# Patient Record
Sex: Male | Born: 1945 | Race: White | Hispanic: No | Marital: Married | Smoking: Former smoker
Health system: Southern US, Community
[De-identification: ages and names within clinical notes are randomized; demographics above are authoritative.]

## PROBLEM LIST (undated history)

## (undated) DIAGNOSIS — Z923 Personal history of irradiation: Secondary | ICD-10-CM

## (undated) DIAGNOSIS — C801 Malignant (primary) neoplasm, unspecified: Secondary | ICD-10-CM

## (undated) DIAGNOSIS — E785 Hyperlipidemia, unspecified: Secondary | ICD-10-CM

## (undated) DIAGNOSIS — I10 Essential (primary) hypertension: Secondary | ICD-10-CM

## (undated) HISTORY — PX: KNEE SURGERY: SHX244

## (undated) HISTORY — PX: ELBOW SURGERY: SHX618

## (undated) HISTORY — PX: SHOULDER ARTHROSCOPY: SHX128

---

## 1998-11-17 ENCOUNTER — Emergency Department (HOSPITAL_COMMUNITY): Admission: EM | Admit: 1998-11-17 | Discharge: 1998-11-17 | Payer: Self-pay | Admitting: Emergency Medicine

## 1999-01-29 ENCOUNTER — Ambulatory Visit (HOSPITAL_COMMUNITY): Admission: RE | Admit: 1999-01-29 | Discharge: 1999-01-29 | Payer: Self-pay | Admitting: Internal Medicine

## 2014-08-09 ENCOUNTER — Other Ambulatory Visit: Payer: Self-pay | Admitting: *Deleted

## 2014-08-09 ENCOUNTER — Other Ambulatory Visit: Payer: Self-pay | Admitting: Internal Medicine

## 2014-08-09 ENCOUNTER — Ambulatory Visit
Admission: RE | Admit: 2014-08-09 | Discharge: 2014-08-09 | Disposition: A | Discharge status: Home / Self Care | Payer: Medicare Other | Source: Ambulatory Visit | Attending: Internal Medicine | Admitting: Internal Medicine

## 2014-08-09 DIAGNOSIS — R05 Cough: Secondary | ICD-10-CM

## 2014-08-09 DIAGNOSIS — R059 Cough, unspecified: Secondary | ICD-10-CM

## 2014-09-23 ENCOUNTER — Ambulatory Visit
Admission: RE | Admit: 2014-09-23 | Discharge: 2014-09-23 | Disposition: A | Discharge status: Home / Self Care | Payer: Medicare Other | Source: Ambulatory Visit | Attending: Internal Medicine | Admitting: Internal Medicine

## 2014-09-23 ENCOUNTER — Other Ambulatory Visit: Payer: Self-pay | Admitting: Internal Medicine

## 2014-09-23 DIAGNOSIS — R918 Other nonspecific abnormal finding of lung field: Secondary | ICD-10-CM

## 2016-03-30 ENCOUNTER — Other Ambulatory Visit: Payer: Self-pay | Admitting: Internal Medicine

## 2016-03-30 ENCOUNTER — Ambulatory Visit
Admission: RE | Admit: 2016-03-30 | Discharge: 2016-03-30 | Disposition: A | Discharge status: Home / Self Care | Payer: Medicare Other | Source: Ambulatory Visit | Attending: Internal Medicine | Admitting: Internal Medicine

## 2016-03-30 DIAGNOSIS — M542 Cervicalgia: Secondary | ICD-10-CM

## 2016-08-25 ENCOUNTER — Other Ambulatory Visit: Payer: Self-pay | Admitting: Otolaryngology

## 2016-08-25 DIAGNOSIS — D3702 Neoplasm of uncertain behavior of tongue: Secondary | ICD-10-CM

## 2016-08-30 ENCOUNTER — Other Ambulatory Visit (HOSPITAL_COMMUNITY): Payer: Self-pay | Admitting: Otolaryngology

## 2016-08-30 DIAGNOSIS — D3702 Neoplasm of uncertain behavior of tongue: Secondary | ICD-10-CM

## 2016-09-01 ENCOUNTER — Ambulatory Visit
Admission: RE | Admit: 2016-09-01 | Discharge: 2016-09-01 | Disposition: A | Discharge status: Home / Self Care | Payer: Medicare Other | Source: Ambulatory Visit | Attending: Otolaryngology | Admitting: Otolaryngology

## 2016-09-01 DIAGNOSIS — D3702 Neoplasm of uncertain behavior of tongue: Secondary | ICD-10-CM

## 2016-09-01 MED ORDER — IOPAMIDOL (ISOVUE-300) INJECTION 61%
75.0000 mL | Freq: Once | INTRAVENOUS | Status: AC | PRN
  Administered 2016-09-01: 75 mL via INTRAVENOUS

## 2016-09-02 ENCOUNTER — Ambulatory Visit (INDEPENDENT_AMBULATORY_CARE_PROVIDER_SITE_OTHER): Payer: Medicare Other | Admitting: Family Medicine

## 2016-09-02 ENCOUNTER — Encounter: Payer: Self-pay | Admitting: Family Medicine

## 2016-09-02 VITALS — BP 124/72 | HR 84 | Resp 22

## 2016-09-02 DIAGNOSIS — T63441A Toxic effect of venom of bees, accidental (unintentional), initial encounter: Secondary | ICD-10-CM | POA: Diagnosis not present

## 2016-09-02 MED ORDER — EPINEPHRINE 0.3 MG/0.3ML IJ SOAJ
0.3000 mg | Freq: Once | INTRAMUSCULAR | Status: AC
  Administered 2016-09-02: 0.3 mg via INTRAMUSCULAR

## 2016-09-02 MED ORDER — PREDNISONE 20 MG PO TABS: ORAL_TABLET | ORAL | 0 refills | Status: DC

## 2016-09-02 MED ORDER — DIPHENHYDRAMINE HCL 50 MG/ML IJ SOLN
50.0000 mg | Freq: Once | INTRAMUSCULAR | Status: AC
  Administered 2016-09-02: 50 mg via INTRAMUSCULAR

## 2016-09-02 MED ORDER — METHYLPREDNISOLONE ACETATE 80 MG/ML IJ SUSP
80.0000 mg | Freq: Once | INTRAMUSCULAR | Status: AC
  Administered 2016-09-02: 80 mg via INTRAMUSCULAR

## 2016-09-02 NOTE — Progress Notes (Signed)
Subjective:    Patient ID: Joel Walton, male    DOB: 12/31/1945, 71 y.o.   MRN: [id number]  HPI Patient is a 71 year old white male previously unknown to me who walked into our clinic today in emergency. 10 minutes prior to arrival, he had been stung on his left cheek by a bee.  Almost instantly, his left eye had swollen shut, he developed severe swelling in his left cheek, his left upper lip began to swell. He also developed swelling and dryness in his throat. He states that he felt as though his throat was closing in. He stopped here immediately for assistance. Immediately he was given an EpiPen injection. The swelling in his throat subsided almost immediately. He was also given Depo-Medrol as well as 50 mg of Benadryl. At no point did he develop anaphylactic shock. His blood pressure remained stable for more than an hour. He did not develop any cardiac arrhythmia. He remained in normal sinus rhythm the entire time. There was no tachycardia or PVCs after the administration of EpiPen.  Patient was monitored in the office for more than 90 minutes after epipen and swelling improved, the swelling in his throat completely subsided. There was no wheezing. In his blood pressure remained stable. Past Medical History:  Diagnosis Date  . 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 No pertinent surgical history.  Current Outpatient Prescriptions:  .  HYDROcodone-acetaminophen (NORCO/VICODIN) 5-325 MG tablet, , Disp: , Rfl: 0 .  lisinopril-hydrochlorothiazide (PRINZIDE,ZESTORETIC) 10-12.5 MG tablet, , Disp: , Rfl: 4 .  lovastatin (MEVACOR) 20 MG tablet, , Disp: , Rfl: 3 .  omeprazole (PRILOSEC) 20 MG capsule, , Disp: , Rfl: 0 .  predniSONE (DELTASONE) 20 MG tablet, 3 tabs poqday 1-2, 2 tabs poqday 3-4, 1 tab poqday 5-6, Disp: 12 tablet, Rfl: 0 No Known Allergies Social History   Social History  . Marital status: Married    Spouse name: N/A  . Number of children: N/A  . Years of education: N/A     Occupational History  . Not on file.   Social History Main Topics  . Smoking status: Never Smoker  . Smokeless tobacco: Never Used  . Alcohol use No  . Drug use: No  . Sexual activity: Not Currently   Other Topics Concern  . Not on file   Social History Narrative  . No narrative on file      Review of Systems  All other systems reviewed and are negative.      Objective:   Physical Exam  Constitutional: He appears well-developed and well-nourished. He appears distressed.  HENT:  Head:    Right Ear: There is swelling.  Nose: Nose normal.  Mouth/Throat: Oropharynx is clear and moist and mucous membranes are normal. No oropharyngeal exudate.    Neck: Trachea normal and normal range of motion. Neck supple. No tracheal deviation present.  Cardiovascular: Normal rate, regular rhythm, S1 normal and S2 normal.  Exam reveals no gallop.   No murmur heard. Pulses:      Carotid pulses are 2+ on the right side, and 2+ on the left side. Pulmonary/Chest: Effort normal and breath sounds normal. No accessory muscle usage or stridor. No tachypnea. No respiratory distress. He has no decreased breath sounds. He has no wheezes. He has no rhonchi. He has no rales.  Vitals reviewed.         Assessment & Plan:  Allergic reaction to bee sting - Plan: EPINEPHrine (EPI-PEN) injection 0.3 mg, diphenhydrAMINE (  BENADRYL) injection 50 mg, methylPREDNISolone acetate (DEPO-MEDROL) injection 80 mg  Due to the possibility of an anaphylactic reaction, the patient was given an EpiPen in the left thigh at 0911.  He was also given 50 mg of Benadryl and 80 mg of Depo-Medrol at 0915.  Patient was monitored in the office for more than 90 minutes. The swelling in his upper lip and around his left eye improved dramatically during this time. At no time did he develop stridor or wheezing or shortness of breath or respiratory distress. His blood pressure remained stable throughout the entire time. At no  point did he develop any cardiac arrhythmia or tachycardia. The swelling in his throat subsided immediately after the epinephrine and did not return for more than 90 minutes. At that point, patient was asking to be able to go home to be with his wife. I recommended he continue Benadryl 25 mg every 4-6 hours until the swelling improves around his eye. Also gave the patient a prednisone taper pack to begin tomorrow to facilitate resolution of his allergic reaction. I recommended he follow-up with his primary care physician tomorrow for recheck and return immediately should any of his symptoms return or worsen.

## 2016-09-02 NOTE — Progress Notes (Addendum)
Patient walked into office approximately 15 minutes after being stung by bee 2. 1 sting on L upper cheek below eye and 1 sting on L middle outer calf. Patient noted to have redness and selling under L eye, and voice noted "gurgly". States that he feels like his throat is closing down.   Patient put in room emergently. Epi-Pen administered at 9:11am. MD evaluated and NO given for Benadryl 50mg  and DepoMedrol 80mg . Administered at 9:15am.   VS as follows: BP 128/70 HR 96 O2 96%  Patient kept in office to evaluate. Noted marked improvement in voice and anxiety reduced. Noted increased edema to upper lips and L lower cheek.  Per MD, continue to monitor.  VS upon re-check at 76min: BP 124/72 HR 84 O2 98

## 2016-09-03 ENCOUNTER — Encounter: Payer: Self-pay | Admitting: Radiation Oncology

## 2016-09-07 ENCOUNTER — Encounter (HOSPITAL_COMMUNITY)
Admission: RE | Admit: 2016-09-07 | Discharge: 2016-09-07 | Disposition: A | Discharge status: Home / Self Care | Payer: Medicare Other | Source: Ambulatory Visit | Attending: Otolaryngology | Admitting: Otolaryngology

## 2016-09-07 DIAGNOSIS — D3702 Neoplasm of uncertain behavior of tongue: Secondary | ICD-10-CM | POA: Insufficient documentation

## 2016-09-07 LAB — GLUCOSE, CAPILLARY: GLUCOSE-CAPILLARY: 104 mg/dL — AB (ref 65–99)

## 2016-09-07 MED ORDER — FLUDEOXYGLUCOSE F - 18 (FDG) INJECTION
9.0000 | Freq: Once | INTRAVENOUS | Status: AC | PRN
  Administered 2016-09-07: 9 via INTRAVENOUS

## 2016-09-08 ENCOUNTER — Encounter: Payer: Self-pay | Admitting: Hematology and Oncology

## 2016-09-08 ENCOUNTER — Telehealth: Payer: Self-pay | Admitting: *Deleted

## 2016-09-08 NOTE — Telephone Encounter (Signed)
Oncology Nurse Navigator Documentation  Placed new referral introductory call to include update for medical oncology appt.  No answer or message capability on patient's  mobile or home phone, work number not current, no answer or message capability on wife's phone.  Gayleen Orem, RN, BSN, [place] [facility] Nurse Portal at [place] [phone]

## 2016-09-08 NOTE — Progress Notes (Signed)
Location of Tumor:  Base ofTongue  Patient presented  months ago with symptoms of: July 2018, sore throat.  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   Nutrition Status Yes No Comments  Weight changes? [x]  []  8 lbs over 1 month  Swallowing concerns? [x]  []    PEG? []  [x]     Referrals Yes No Comments  Social Work? [x]  []    Dentistry? [x]  []  Dr. Enrique Sack 09/14/16 10:00  Swallowing therapy? [x]  []    Nutrition? [x]  []  Pt already started Boost/Ensure supplement  Med/Onc? [x]  []  Dr. Lebron Conners 09/14/2016   Safety Issues Yes No Comments  Prior radiation? []  [x]    Pacemaker/ICD? []  [x]    Possible current pregnancy? []  [x]    Is the patient on methotrexate? []  [x]     Tobacco/Marijuana/Snuff/ETOH use: Smoked, quit 1978, alcohol 3-4 beers per occasionally per day.  Past/Anticipated interventions by otolaryngology, if any:  Direct laryngoscopy and biopsy Dr. Lucia Gaskins  Past/Anticipated interventions by medical oncology, if any:  Appointment 09/14/2016 Dr. Lebron Conners   Current Complaints / other details:   09/07/2016    NM PET Image Initial (PI) Skull Base To Thigh IMPRESSION: 1. Large hypermetabolic mass centered at the RIGHT base of tongue extending to the LEFT base of tongue. 2. Hypermetabolic RIGHT level II metastatic node. 3. Two  smaller hypermetabolic LEFT level II metastatic nodes. 4. No evidence of distant metastatic disease in the chest, abdomen, or pelvis.  09/01/2016   CT SOFT TISSUE NECK W CONTRAST IMPRESSION: Invasive tumor at the right tongue base skullbase with necrosis and ulceration. Approximate measurements are 3.9 x 2.9 x 3.2 cm. The lesion extends down to the vallecula. There is likely involvement of the pre epiglottic fat. Tumor does cross the midline to the left.  Definite metastatic lymphadenopathy right level 2. Possible metastatic adenopathy left level 2.  Patient is  married and has a dog and two children and grandchildren. Patient's wife recently had knee replacement and she has MS so he cares for her and their grandchild.    Patient complaining of sore throat that radiates up into his right ear/head and now is migrating into the left ear rating a 10/10.  Patient states he is taking Norco for the pain relief. He states that he has had to alter his food from hamburgers to mashed potatoes and soup and he is supplementing with 1 -2 boost daily.  Patient states he drinks water but doesn't push fluids as much but encouraged patient to really push fluids as much as he can to prevent dehydration.    Vitals:   09/14/16 0837  BP: 133/88  Pulse: 78  Resp: 20  Temp: 98.4 F (36.9 C)  TempSrc: Oral  SpO2: 99%  Weight: 177 lb 9.6 oz (80.6 kg)   Wt Readings from Last 3 Encounters:  09/14/16 177 lb 9.6 oz (80.6 kg)

## 2016-09-09 ENCOUNTER — Telehealth: Payer: Self-pay | Admitting: *Deleted

## 2016-09-09 NOTE — Telephone Encounter (Signed)
Oncology Nurse Navigator Documentation  Placed introductory call to new referral patient Mr. Cosma.  Introduced myself as the H&N oncology nurse navigator that works with Drs. Ginny Forth to whom he has been referred by Dr. Lucia Gaskins.  He confirmed his understanding of referrals and appt date/times of 09/14/16 8:30 Nurse Eval, 9:00 Dr. Isidore Moos, 1:00 Dr. Lebron Conners.  I informed him of 10:00 appt with Dental Medicine following Dr. Isidore Moos.  I briefly explained my role as his navigator, indicated that I would be joining him during his appts next Tuesday.  I confirmed his understanding of [facility] location, explained arrival and RadOnc registration process.  I answered his questions re treatment plan, prognosis.  I provided my contact information, encouraged him to call with questions/concerns before next week.  He verbalized understanding of information provided, expressed appreciation for my call.  Gayleen Orem, RN, BSN, [place] [facility] Nurse [place] at [place] [phone]

## 2016-09-14 ENCOUNTER — Other Ambulatory Visit: Payer: Self-pay | Admitting: *Deleted

## 2016-09-14 ENCOUNTER — Ambulatory Visit
Admission: RE | Admit: 2016-09-14 | Discharge: 2016-09-14 | Disposition: A | Discharge status: Home / Self Care | Payer: Medicare Other | Source: Ambulatory Visit | Attending: Radiation Oncology | Admitting: Radiation Oncology

## 2016-09-14 ENCOUNTER — Encounter: Payer: Self-pay | Admitting: *Deleted

## 2016-09-14 ENCOUNTER — Ambulatory Visit
Admission: RE | Admit: 2016-09-14 | Discharge: 2016-09-14 | Disposition: A | Discharge status: Home / Self Care | Payer: Medicare Other | Source: Ambulatory Visit | Attending: Hematology and Oncology | Admitting: Hematology and Oncology

## 2016-09-14 ENCOUNTER — Encounter: Payer: Self-pay | Admitting: Radiation Oncology

## 2016-09-14 ENCOUNTER — Encounter: Payer: Self-pay | Admitting: Hematology and Oncology

## 2016-09-14 ENCOUNTER — Ambulatory Visit (HOSPITAL_COMMUNITY): Payer: Self-pay | Admitting: Dentistry

## 2016-09-14 ENCOUNTER — Encounter (HOSPITAL_COMMUNITY): Payer: Self-pay | Admitting: Dentistry

## 2016-09-14 ENCOUNTER — Ambulatory Visit (HOSPITAL_BASED_OUTPATIENT_CLINIC_OR_DEPARTMENT_OTHER): Payer: Medicare Other | Admitting: Hematology and Oncology

## 2016-09-14 VITALS — BP 110/64 | HR 49 | Temp 98.2°F

## 2016-09-14 VITALS — BP 96/63 | HR 96 | Temp 98.3°F | Resp 18 | Ht 69.0 in | Wt 178.7 lb

## 2016-09-14 VITALS — BP 133/88 | HR 78 | Temp 98.4°F | Resp 20 | Wt 177.6 lb

## 2016-09-14 DIAGNOSIS — Z79899 Other long term (current) drug therapy: Secondary | ICD-10-CM | POA: Diagnosis not present

## 2016-09-14 DIAGNOSIS — Z01818 Encounter for other preprocedural examination: Secondary | ICD-10-CM

## 2016-09-14 DIAGNOSIS — C01 Malignant neoplasm of base of tongue: Secondary | ICD-10-CM | POA: Diagnosis not present

## 2016-09-14 DIAGNOSIS — Z510 Encounter for antineoplastic radiation therapy: Secondary | ICD-10-CM | POA: Diagnosis present

## 2016-09-14 DIAGNOSIS — Z972 Presence of dental prosthetic device (complete) (partial): Secondary | ICD-10-CM

## 2016-09-14 DIAGNOSIS — K08109 Complete loss of teeth, unspecified cause, unspecified class: Secondary | ICD-10-CM

## 2016-09-14 DIAGNOSIS — I10 Essential (primary) hypertension: Secondary | ICD-10-CM | POA: Insufficient documentation

## 2016-09-14 DIAGNOSIS — K0820 Unspecified atrophy of edentulous alveolar ridge: Secondary | ICD-10-CM

## 2016-09-14 DIAGNOSIS — E785 Hyperlipidemia, unspecified: Secondary | ICD-10-CM | POA: Insufficient documentation

## 2016-09-14 DIAGNOSIS — R1310 Dysphagia, unspecified: Secondary | ICD-10-CM | POA: Insufficient documentation

## 2016-09-14 DIAGNOSIS — Z87891 Personal history of nicotine dependence: Secondary | ICD-10-CM | POA: Diagnosis not present

## 2016-09-14 DIAGNOSIS — C770 Secondary and unspecified malignant neoplasm of lymph nodes of head, face and neck: Secondary | ICD-10-CM

## 2016-09-14 DIAGNOSIS — C029 Malignant neoplasm of tongue, unspecified: Secondary | ICD-10-CM | POA: Insufficient documentation

## 2016-09-14 HISTORY — DX: Hyperlipidemia, unspecified: E78.5

## 2016-09-14 HISTORY — DX: Essential (primary) hypertension: I10

## 2016-09-14 LAB — BUN AND CREATININE (CC13)
BUN: 35.9 mg/dL — ABNORMAL HIGH (ref 7.0–26.0)
CREATININE: 1.1 mg/dL (ref 0.7–1.3)
EGFR: 72 mL/min/{1.73_m2} — AB (ref >90)

## 2016-09-14 NOTE — Patient Instructions (Signed)
Thank you for choosing [facility] to provide your oncology and hematology care.  To afford each patient quality time with our providers, please arrive 30 minutes before your scheduled appointment time.  If you arrive late for your appointment, you may be asked to reschedule.  We strive to give you quality time with our providers, and arriving late affects you and other patients whose appointments are after yours.   If you are a no show for multiple scheduled visits, you may be dismissed from the clinic at the providers discretion.    Again, thank you for choosing [facility], our hope is that these requests will decrease the amount of time that you wait before being seen by our physicians.  ______________________________________________________________________  Should you have questions after your visit to the [facility], please contact our office at [phone] between the hours of 8:30 and 4:30 p.m.    Voicemails left after 4:30p.m will not be returned until the following business day.    For prescription refill requests, please have your pharmacy contact us directly.  Please also try to allow 48 hours for prescription requests.    Please contact the scheduling department for questions regarding scheduling.  For scheduling of procedures such as PET scans, CT scans, MRI, Ultrasound, etc please contact central scheduling at [phone].    Resources For Cancer Patients and Caregivers:   Oncolink.org:  A wonderful resource for patients and healthcare providers for information regarding your disease, ways to tract your treatment, what to expect, etc.     American Cancer Society:  800-227-2345  Can help patients locate various types of support and financial assistance  Cancer Care: 1-800-813-HOPE (4673) Provides financial assistance, online support groups, medication/co-pay assistance.    [place] DSS:  336-641-3447 Where to apply for food  stamps, Medicaid, and utility assistance  Medicare Rights Center: 800-333-4114 Helps people with Medicare understand their rights and benefits, navigate the Medicare system, and secure the quality healthcare they deserve  [employer]: 336-333-6589 [employer]'s shared-ride transportation service for eligible riders who have a disability that prevents them from riding the fixed route bus.    For additional information on assistance programs please contact our social worker:   Grier Hock/Abigail Elmore:  [phone]            

## 2016-09-14 NOTE — Progress Notes (Signed)
error 

## 2016-09-14 NOTE — Progress Notes (Signed)
Radiation Oncology         [phone] ________________________________  Initial outpatient Consultation  Name: ZACHERIE HONEYMAN MRN: [id number]  Date: 09/14/2016  DOB: 04/19/1945  IW:[id number], Jori Moll, MD  Rozetta Nunnery, *   REFERRING PHYSICIAN: Rozetta Nunnery, *  DIAGNOSIS:    ICD-10-CM   1. 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   HISTORY OF PRESENT ILLNESS::Patterson L Palin is a 71 y.o. male who presented with symptoms of sore throat in July of 2018. Biopsy performed by Dr. 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 On 09/07/2016 patient underwent a  PET scan that revealed a large hypermetabolic mass centered at the right base of tongue extending to the left base of the tongue, hypermetabolic right level II metastatic node, two smaller hypermetabolic left level II metastatic nodes, and no evidence of distant metastatic disease in the chest, abdomen, or pelvis. Previous CT of the neck on 09/01/2016 revealed invasive tumor at the right tongue base with necrosis and ulceration measuring approximately 3.9 x 2.9 x 3.2 cm. ,the lesion extends down to the vallecula. There is likely involvement of the pre epiglottic fat and definite metastatic lymphadenopathy right level 2 and possible metastatic adenopathy left level 2.  Swallowing issues, if any: sore thoart   Pain status:Pain in his throat when he swallows.  Tobacco history, if any: He quit smoking in 1978.   ETOH abuse, if any: drinks 3-4 beer a day and does not use smokeless  tobacco.  Prior cancers, if any: None   Patient complains of throat pain, headache and has hearing loss.  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    PAST SURGICAL HISTORY: Past Surgical History:  Procedure Laterality Date  . ELBOW SURGERY Right   . KNEE SURGERY Bilateral    Not total knee replacements  . SHOULDER ARTHROSCOPY Right     FAMILY HISTORY: no report of similar cancers in family  SOCIAL HISTORY:  reports that he has quit smoking. He smoked 0.50 packs per day. He has never used smokeless tobacco. He reports that he drinks alcohol. He reports that he does not use drugs.  ALLERGIES: Patient has no known allergies.  MEDICATIONS:  Current Outpatient Prescriptions  Medication Sig Dispense Refill  . aspirin 81 MG chewable tablet Chew by mouth daily.    Marland Kitchen HYDROcodone-acetaminophen (NORCO/VICODIN) 5-325 MG tablet   0  . lisinopril-hydrochlorothiazide (PRINZIDE,ZESTORETIC) 10-12.5 MG tablet Take 1 tablet by mouth daily.   4  . lovastatin (MEVACOR) 20 MG tablet Take 40 mg by mouth daily.   3  . Multiple Vitamin (MULTIVITAMIN) tablet Take 1 tablet by mouth daily.    Marland Kitchen omeprazole (PRILOSEC) 20 MG capsule   0  . predniSONE (DELTASONE) 20 MG tablet 3 tabs poqday 1-2, 2 tabs poqday 3-4, 1 tab poqday 5-6 12 tablet 0   No current facility-administered medications for this encounter.     REVIEW OF SYSTEMS:  A 10+ POINT REVIEW OF SYSTEMS WAS OBTAINED  including neurology, dermatology, psychiatry, cardiac, respiratory, lymph, extremities, GI, GU, Musculoskeletal, constitutional,  HEENT.  All pertinent positives are noted in the HPI.  All others are negative.   PHYSICAL EXAM:  weight is 177 lb 9.6 oz (80.6 kg). His oral temperature is 98.4 F (36.9 C). His blood pressure is 133/88 and his pulse is 78. His respiration is 20 and oxygen saturation is 99%.   General: Alert and oriented,  in no acute distress HEENT: Head is normocephalic. Patient has dentures. Tongue is midline,  Extraocular movements are intact. Oropharynx appears clear. No palpable lesions on the back of tongue. Decreased hearing bilaterally.  Neck: Neck is notable for palpable mass in level 2 right neck about 3 cm, no other palpable masses appreciated.  Heart: Regular in rate and rhythm with no murmurs, rubs, or gallops. Chest: Clear to auscultation bilaterally, with no rhonchi, wheezes, or rales. Abdomen: Soft, nontender, nondistended, with no rigidity or guarding. Extremities: No cyanosis or edema. Strength is symmetric in his extremities.  Lymphatics: see Neck Exam Skin: No concerning lesions. Musculoskeletal: symmetric strength and muscle tone throughout.  Neurologic: Cranial nerves II through XII are grossly intact. No obvious focalities. Speech is fluent. Coordination is intact. Psychiatric: Judgment and insight are intact. Affect is appropriate.   ECOG = 0  0 - Asymptomatic (Fully active, able to carry on all predisease activities without restriction)  1 - Symptomatic but completely ambulatory (Restricted in physically strenuous activity but ambulatory and able to carry out work of a light or sedentary nature. For example, light housework, office work)  2 - Symptomatic, <50% in bed during the day (Ambulatory and capable of all self care but unable to carry out any work activities. Up and about more than 50% of waking hours)  3 - Symptomatic, >50% in bed, but not bedbound (Capable of only limited self-care, confined to bed or chair 50% or more of waking hours)  4 - Bedbound (Completely disabled. Cannot carry on any self-care. Totally confined to bed or chair)  5 - Death   Eustace Pen MM, Creech RH, Tormey DC, et al. [phone]). "Toxicity and response criteria of the [facility]". [place] Oncol. 5 (6): 649-55   LABORATORY DATA:  No results found for: WBC, HGB, HCT, MCV,  PLT CMP     Component Value Date/Time   BUN 35.9 (H) 09/14/2016 1431   CREATININE 1.1 09/14/2016 1431        RADIOGRAPHY: Ct Soft Tissue Neck W Contrast  Result Date: 09/01/2016 CLINICAL DATA:  Neoplasm of the tongue. EXAM: CT NECK WITH CONTRAST TECHNIQUE: Multidetector CT imaging of the neck was performed using the standard protocol following the bolus administration of intravenous contrast. CONTRAST:  42mL ISOVUE-300 IOPAMIDOL (ISOVUE-300) INJECTION 61% COMPARISON:  None. FINDINGS: Pharynx and larynx: There is a large tumor invasive at the right tongue base with ulceration. This has a maximal AP dimension of 3.9 cm, with a transverse dimension otherwise of approximately 2.9 x 3.2 cm. The tumor extends down to the vallecula. There is probable involvement of the pre epiglottic fat. The tumor does cross the midline to the left. Salivary glands: Parotid and submandibular glands are normal. Small area of focal fatty atrophy in the left parotid gland. Thyroid: Normal Lymph nodes: Metastatic node right level 2 with internal low density measuring 3.2 x 2.5 x 1.6 cm. No other abnormal node on the right. On the left, there are questionable abnormal level 2 nodes, the more superior measuring 1.7 cm in  length with short axis of 8 mm in the lower node measuring 2.9 cm in length with short axis of 8 mm. Vascular: Normal Limited intracranial: Normal Visualized orbits: Normal Mastoids and visualized paranasal sinuses: Clear/normal Skeleton: Advanced degenerative spondylosis. Upper chest: Negative Other: None IMPRESSION: Invasive tumor at the right tongue base skullbase with necrosis and ulceration. Approximate measurements are 3.9 x 2.9 x 3.2 cm. The lesion extends down to the vallecula. There is likely involvement of the pre epiglottic fat. Tumor does cross the midline to the left. Definite metastatic lymphadenopathy right level 2. Possible metastatic adenopathy left level 2. Electronically Signed   By: Nelson Chimes  M.D.   On: 09/01/2016 09:50   Nm Pet Image Initial (pi) Skull Base To Thigh  Result Date: 09/07/2016 CLINICAL DATA:  Initial treatment strategy for base of tongue neoplasm RIGHT ear pain. Sore throat. EXAM: NUCLEAR MEDICINE PET SKULL BASE TO THIGH TECHNIQUE: 9.0 mCi F-18 FDG was injected intravenously. Full-ring PET imaging was performed from the skull base to thigh after the radiotracer. CT data was obtained and used for attenuation correction and anatomic localization. FASTING BLOOD GLUCOSE:  Value: 104 mg/dl COMPARISON:  Neck CT 09/01/2016 FINDINGS: NECK Mass lesion centered at the RIGHT base of tongue and extending to the LEFT base of tongue with intense metabolic activity (SUV max equal 20.1). Metabolic activity extends inferior to the level the vallecula and pre epiglottic fat. Enlarged hypermetabolic RIGHT level 2 lymph node with intense metabolic activity (SUV max equal 19.5) measures 17 mm short axis (image 28, series 4). Intense metabolic activity for size associated with smaller LEFT level 2 lymph nodes measuring 6 mm short axis on image 20, series 4 and 7 mm short axis on image 30, series 4. These lymph nodes have SUV max of 3.2 and 3.4 respectively. CHEST No hypermetabolic mediastinal lymph nodes. No hypermetabolic pulmonary nodules. No suspicious pulmonary nodule on CT portion exam. ABDOMEN/PELVIS No abnormal hypermetabolic activity within the liver, pancreas, adrenal glands, or spleen. No hypermetabolic lymph nodes in the abdomen or pelvis. SKELETON No focal hypermetabolic activity to suggest skeletal metastasis. IMPRESSION: 1. Large hypermetabolic mass centered at the RIGHT base of tongue extending to the LEFT base of tongue. 2. Hypermetabolic RIGHT level II metastatic node. 3. Two  smaller hypermetabolic LEFT level II metastatic nodes. 4. No evidence of distant metastatic disease in the chest, abdomen, or pelvis. Electronically Signed   By: Suzy Bouchard M.D.   On: 09/07/2016 16:09       IMPRESSION/PLAN: Base of tongue cancer, p16+  I personally reviewed the patient's CT and PET imaging. This is a delightful patient with head and neck cancer. I recommend radiotherapy for this patient and also that the patient quits drinking alcohol completely (he reports he has stopped temporarily, so I advised him to quit permanently) as this may have played a role in his disease, in addition to the HPV.  We discussed the potential risks, benefits, and side effects of radiotherapy. We talked in detail about acute and late effects. We discussed that some of the most bothersome acute effects may be mucositis, dysgeusia, salivary changes, skin irritation, hair loss, dehydration, weight loss and fatigue. We talked about late effects which include but are not necessarily limited to dysphagia, hypothyroidism, nerve injury, spinal cord injury, xerostomia,  and neck edema. No guarantees of treatment were given. A consent form was signed and placed in the patient's medical record. The patient is enthusiastic about proceeding with treatment. I look forward to participating  in the patient's care.Treatment will take approximately 7 weeks. We recommend a feeding tube and the patient was educated on how to use the feeding tube during treatment.    I will prescribe lidocacine mouthwash to patient's pharmacy at [facility] on [address] and I will order labs as well.  Simulation (treatment planning) will take place on 09/15/2016 and radiation will start on September 28, 2016.    We also discussed that the treatment of head and neck cancer is a multidisciplinary process to maximize treatment outcomes and quality of life. For this reasons the following referrals have been or will be made:  Patient is scheduled for an appointment today with Dr. Lebron Conners in Medical oncology to discuss chemotherapy.  Patient is scheduled for an appointment today at 10:00am with Dr. Enrique Sack on in Dentistry for dental evaluation, in case of  retained tooth roots prior to radiotherapy.    Nutritionist for nutrition support during and after treatment.   Speech language pathology for swallowing and/or speech therapy.   Social work for social support.    Physical therapy due to risk of lymphedema in neck and deconditioning.   Baseline labs including TSH  __________________________________________   Joel Gibson, MD This document serves as a record of services personally performed by Joel Gibson, MD. It was created on her behalf by Valeta Harms, a trained medical scribe. The creation of this record is based on the scribe's personal observations and the provider's statements to them. This document has been checked and approved by the attending provider.

## 2016-09-14 NOTE — Progress Notes (Signed)
DENTAL CONSULTATION  Date of Consultation:  09/14/2016 Patient Name:   Detravion Tester Wawrzyniak Date of Birth:   1945-11-22 Medical Record Number: [id number]  VITALS: BP 110/64 (BP Location: Left Arm)   Pulse (!) 49   Temp 98.2 F (36.8 C) (Oral)   CHIEF COMPLAINT: Patient referred by Dr. Isidore Moos for a dental consultation.  HPI: DESIREE FLEMING is a 71 year old male recently diagnosed with squamous cell carcinoma of the base of tongue. Patient with anticipated chemoradiation therapy. Patient is now seen as part of a pre-chemoradiation therapy dental protocol examination.  The patient is edentulous. The patient has upper and lower complete dentures that were recently fabricated approximately one year ago. They were fabricated by Dr. Joselyn Glassman.  The patient indicates that the upper lower dentures "fit fine." Patient has been wearing upper and lower complete dentures since approximately 1978. The patient denies having dental phobia.   PROBLEM LIST: Patient Active Problem List   Diagnosis Date Noted  . Cancer of base of tongue ([place]) 09/14/2016    Priority: High    PMH: Past Medical History:  Diagnosis Date  . Hyperlipidemia   . Hypertension   . Lymph node cancer (HCC)     PSH: Past Surgical History:  Procedure Laterality Date  . ELBOW SURGERY Right   . KNEE SURGERY Bilateral   . SHOULDER ARTHROSCOPY Right     ALLERGIES: No Known Allergies  MEDICATIONS: Current Outpatient Prescriptions  Medication Sig Dispense Refill  . aspirin 81 MG chewable tablet Chew by mouth daily.    Marland Kitchen HYDROcodone-acetaminophen (NORCO/VICODIN) 5-325 MG tablet   0  . lisinopril-hydrochlorothiazide (PRINZIDE,ZESTORETIC) 10-12.5 MG tablet Take 1 tablet by mouth daily.   4  . lovastatin (MEVACOR) 20 MG tablet Take 40 mg by mouth daily.   3  . Multiple Vitamin (MULTIVITAMIN) tablet Take 1 tablet by mouth daily.    Marland Kitchen omeprazole (PRILOSEC) 20 MG capsule   0  . predniSONE (DELTASONE) 20 MG tablet 3 tabs  poqday 1-2, 2 tabs poqday 3-4, 1 tab poqday 5-6 (Patient not taking: Reported on 09/14/2016) 12 tablet 0   No current facility-administered medications for this visit.     LABS: No results found for: WBC, HGB, HCT, MCV, PLT No results found for: NA, K, CL, CO2, GLUCOSE, BUN, CREATININE, CALCIUM, GFRNONAA, GFRAA No results found for: INR, PROTIME No results found for: PTT  SOCIAL HISTORY: Social History   Social History  . Marital status: Married    Spouse name: N/A  . Number of children: 2  . Years of education: N/A   Occupational History  . Retired      Personal assistant   Social History Main Topics  . Smoking status: Former Research scientist (life sciences)  . Smokeless tobacco: Never Used     Comment: Quit in 1978  . Alcohol use Yes     Comment: 3-4 beers daily  . Drug use: No  . Sexual activity: Not Currently   Other Topics Concern  . Not on file   Social History Narrative  . No narrative on file   FAMILY HISTORY: History reviewed. No pertinent family history.  Mother and father are both deceased.  REVIEW OF SYSTEMS: Reviewed with the patient as per History of present illness. Psych: Patient denies having dental phobia.  DENTAL HISTORY: CHIEF COMPLAINT: Patient referred by Dr. Isidore Moos for a dental consultation.  HPI: RHETT MUTSCHLER is a 71 year old male recently diagnosed with squamous cell carcinoma of the base of tongue. Patient with anticipated  chemoradiation therapy. Patient is now seen as part of a pre-chemoradiation therapy dental protocol examination.  The patient is edentulous. The patient has upper and lower complete dentures that were recently fabricated approximately one year ago. They were fabricated by Dr. Joselyn Glassman.  The patient indicates that the upper lower dentures "fit fine." Patient has been wearing upper and lower complete dentures since approximately 1978. The patient denies having dental phobia.    DENTAL EXAMINATION: GENERAL:Patient is a  well-developed, well-nourished male in no acute distress. HEAD AND NECK:  Patient has palpable right neck lymphadenopathy. Patient has left neck involvement per PET scan  INTRAORAL EXAM:Patient has normal saliva. Patient has flabby tissues associated with the right and left maxillary tuberosities, premaxilla, and mandibular anterior soft tissues. There is atrophy of the edentulous alveolar ridges. PROSTHODONTIC: The patient has upper and lower complete dentures. The maxillary complete denture is stable and retentive. The mandibular complete denture has less than ideal retention secondary to significant atrophy of the edentulous alveolar ridges.  OCCLUSION:The occlusion of the upper and lower complete dentures is acceptable.  RADIOGRAPHIC INTERPRETATION: An orthopantogram was taken. Patient is edentulous. There is atrophy of the edentulous alveolar ridges.  There is no evidence of retained root tips or impacted teeth. There is pneumatization of the bilateral maxillary sinuses.  ASSESSMENTS: 1. Squamous cell carcinoma the base of tongue 2. Pre-chemotherapy ration therapy dental protocol 3. The patient is edentulous. 4. There is atrophy of the edentulous alveolar ridges. 5. The patient has flabby tissues associated with the right and left maxillary tuberosities, premaxilla, and mandibular anterior areas. 6. Upper and lower complete dentures that are clinically acceptable   PLAN/RECOMMENDATIONS: 1. I discussed the risks, benefits, and complications of various treatment options with the patient in relationship to his medical and dental conditions, anticipated chemoradiation therapy, chemoradiation therapy side effects to include xerostomia, trismus, mucositis, taste changes, gum and jawbone changes, and risk for osteoradionecrosis. We discussed various treatment options to include no treatment, pre-prosthetic surgery as indicated, implant therapy, and replacement of missing teeth as indicated. The  patient currently refuses all dental treatment at this time. The patient is currently cleared for chemoradiation therapy at this time.   2. Discussion of findings with medical team and coordination of future medical and dental care as needed.  I spent in excess of  60 minutes during the conduct of this consultation and >50% of this time involved direct face-to-face encounter for counseling and/or coordination of the patient's care.    Lenn Cal, DDS

## 2016-09-14 NOTE — Patient Instructions (Signed)

## 2016-09-15 ENCOUNTER — Ambulatory Visit
Admission: RE | Admit: 2016-09-15 | Discharge: 2016-09-15 | Disposition: A | Discharge status: Home / Self Care | Payer: Medicare Other | Source: Ambulatory Visit | Attending: Radiation Oncology | Admitting: Radiation Oncology

## 2016-09-15 ENCOUNTER — Telehealth: Payer: Self-pay | Admitting: *Deleted

## 2016-09-15 ENCOUNTER — Encounter: Payer: Self-pay | Admitting: *Deleted

## 2016-09-15 ENCOUNTER — Other Ambulatory Visit: Payer: Self-pay | Admitting: Radiation Oncology

## 2016-09-15 DIAGNOSIS — R634 Abnormal weight loss: Secondary | ICD-10-CM

## 2016-09-15 DIAGNOSIS — C01 Malignant neoplasm of base of tongue: Secondary | ICD-10-CM

## 2016-09-15 DIAGNOSIS — Z510 Encounter for antineoplastic radiation therapy: Secondary | ICD-10-CM | POA: Diagnosis not present

## 2016-09-15 MED ORDER — LIDOCAINE VISCOUS 2 % MT SOLN: OROMUCOSAL | 5 refills | Status: DC

## 2016-09-15 NOTE — Progress Notes (Signed)
error 

## 2016-09-15 NOTE — Telephone Encounter (Signed)
Oncology Nurse Navigator Documentation  Spoke with Mr. Joel Walton, informed him IV contrast has been cancelled for today's CT SIM, asked him to arrive at 1:45.  He voiced understanding.  Gayleen Orem, RN, BSN, [place] Neck Oncology Nurse [place] at [place] [phone]

## 2016-09-15 NOTE — Addendum Note (Signed)
Encounter addended by: Brexlee Heberlein, Stephani Police, RN on: 09/15/2016 10:12 AM<BR>    Actions taken: Sign clinical note

## 2016-09-15 NOTE — Progress Notes (Signed)
Head and Neck Cancer Simulation, IMRT treatment planning, and Special treatment procedure note   Outpatient  Diagnosis:    ICD-10-CM   1. Cancer of base of tongue (HCC) C01    The patient was taken to the CT simulator and laid in the supine position on the table. An Aquaplast head and shoulder mask was custom fitted to the patient's anatomy. High-resolution CT axial imaging was obtained of the head and neck with contrast. I verified that the quality of the imaging is good for treatment planning. 1 Medically Necessary Treatment Device was fabricated and supervised by me: Aquaplast mask.  Treatment planning note I plan to treat the patient with IMRT. I plan to treat the patient's tumor and bilateral neck nodes. I plan to treat to a total dose of 70 Gray in 35  fractions. Dose calculation was ordered from dosimetry.  IMRT planning Note  IMRT is medically necessary and an important modality to deliver adequate dose to the patient's at risk tissues while sparing the patient's normal structures, including the: esophagus, parotid tissue, mandible, brain stem, spinal cord, oral cavity, brachial plexus.  This justifies the use of IMRT in the patient's treatment.   Special Treatment Procedure Note:  The patient will be receiving chemotherapy concurrently. Chemotherapy heightens the risk of side effects. I have considered this during the patient's treatment planning process and will monitor the patient accordingly for side effects on a weekly basis. Concurrent chemotherapy increases the complexity of this patient's treatment and therefore this constitutes a special treatment procedure.  -----------------------------------  Rhonda Linan, MD 

## 2016-09-15 NOTE — Telephone Encounter (Signed)
Oncology Nurse Navigator Documentation  Spoke with Kara, [facility], requested PEG kit delivery by next Monday to [facility] front desk for my delivery to patient. She voiced understanding.  Rick Sederick Jacobsen, RN, BSN, CHPN Head & Neck Oncology Nurse Navigator [facility] at [place] [phone]    

## 2016-09-16 ENCOUNTER — Encounter: Payer: Self-pay | Admitting: *Deleted

## 2016-09-16 NOTE — Progress Notes (Signed)
Oncology Nurse Navigator Documentation  Met with Mr. Brobeck during initial consult with Dr. Lebron Conners. He voiced understanding of plan for chemotherapy in conjunction with RT. He voiced further understanding of purpose/benefit of PAC and PEG placement.  I showed example of PowerPort PAC, explained placement procedure. He voiced understanding I will provide PEG education prior to placement, will be support resource to assist with care. I encouraged him to call me with questions/concerns.  Gayleen Orem, RN, BSN, [place] Neck Oncology Nurse [place] at [place] [phone]

## 2016-09-16 NOTE — Progress Notes (Signed)
Oncology Nurse Navigator Documentation  Met with patient during initial consult with Dr. Isidore Moos.  He was accompanied by    1. Further introduced myself as his Navigator, explained my role as a member of the Care Team.   2. Provided New Patient Information packet, discussed contents:  Contact information for physician(s), myself, other members of the Care Team.  Advance Directive information ([place] blue pamphlet with LCSW contact info)  Fall Prevention Patient Enterprise sheet  [place] campus map with highlight of [place] 3. Provided introductory explanation of radiation treatment including SIM planning and purpose of Aquaplast head and shoulder mask, showed him example.   4. Provided and discussed education handouts for PEG and PAC.  5.   He voiced understanding radiation planning session is scheduled for tomorrow, 2:00. 6.   He reported:  Drinking boost, 1-2 Boost daily.  Advised to increase to address recent weight loss, increase water intake also.  Drinking 3-4 beers daily.  Advised to curtail all ETOH to optimize prognosis.  Primary caregiver for wife who has MS, had recent R knee replacement.    Daughter most likely will be able to provide transportation assist when needed. He voiced understanding of information provided. I escorted him to [place] for appt, he voiced understanding I will be joining him for his appt with Dr. Lebron Conners later this afternoon.  Gayleen Orem, RN, BSN, [place] Neck Oncology Nurse [place] at [place] [phone]

## 2016-09-16 NOTE — Progress Notes (Deleted)
Oncology Nurse Navigator Documentation  Met with patient during initial consult with Dr. Isidore Moos.  He was unaccompanied.    1. Further introduced myself as his Navigator, explained my role as a member of the Care Team.   2. Provided New Patient Information packet, discussed contents:  Contact information for physician(s), myself, other members of the Care Team.  Advance Directive information ([place] blue pamphlet with LCSW contact info)  Fall Prevention Patient Safety Plan  Appointment guideline.  Financial Assistance Information sheet  [place] with highlight of [place] 3. Provided introductory explanation of radiation treatment including SIM planning and purpose of Aquaplast head and shoulder mask, showed them example.   4. Provided and discussed education handouts for PEG and PAC.  5. He voiced understanding radiation planning session is scheduled for tomorrow, 2:00. 6. He reported:  Drinking boost, 1-2 Boost daily.  Advised to increase to address recent weight loss, increase water intake also.  Drinking 3-4 beers daily.  Advised to curtail all ETOH to optimize prognosis.  Primary caregiver for wife who has MS, had recent R knee replacement.    Daughter most likely will be able to provide transportation assist when needed. He voiced understanding of information provided. I escorted him to [place] for appt, he voiced understanding I will be joining him for his appt with Dr. Lebron Conners later this afternoon.  Gayleen Orem, RN, BSN, [place] [facility] Oncology Nurse [place] at [place] [phone]

## 2016-09-16 NOTE — Progress Notes (Signed)
Note entered incorrect day, deleted, reentered for 8/7.

## 2016-09-16 NOTE — Progress Notes (Signed)
Oncology Nurse Navigator Documentation  To provide support, encouragement and care continuity, met with Mr. Kawecki during CT SIM He tolerated procedure without difficulty. I showed him location of [place], explained arrival/preparation procedures.  He voiced understanding. Provided him barium sulfate solution obtained from [facility] for PEG placement scheduled for 8/17, reviewed instructions.  He voiced understanding.  We arrived to Radiation Nursing for PEG education. Using  PEG teaching device   and Teach Back, provided education for PEG use and care, including: hand hygiene, gravity bolus administration of daily water flushes and nutritional supplement, fluids and medications; care of tube insertion site including daily dressing change and cleaning; S&S of infection.  Mr. Luecke correctly verbalized dressing change and cleaning procedures, provided correct return demonstration of dressing change and gravity administration of water.  I provided written instructions for PEG flushing/dressing change in support of verbal instruction.  He understands I will be available for ongoing PEG support.  I described contents of PEG Starter Kit he will be receiving day of procedure.    I encouraged him to call me with questions/concerns.  He voiced understanding.  Gayleen Orem, RN, BSN, [place] [facility] Oncology Nurse [place] at [place] [phone]

## 2016-09-17 ENCOUNTER — Encounter: Payer: Self-pay | Admitting: Hematology and Oncology

## 2016-09-17 NOTE — Assessment & Plan Note (Signed)
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  No evidence of distal metastatic disease at this time based on PET/CT. Patient appears to be a good candidate for curative-intent therapy with combined chemotherapy-radiation modality. He is not a good candidate for cisplatin-based chemotherapy due to pre-existing hearing loss requiring hearing aids. In this situation, I prefer to use carboplatin--based therapy regimen.  Based on presence of ulcerated primary lesion, consideration can be made to neoadjuvant chemotherapy preceding the chemoradiotherapy approach which would potentially assist in healing the preexistent ulceration and reduce the impact of mucositis that will accompany combined treatment modality. Treatment may start with carboplatin/paclitaxel combination followed by carboplatin/5-FU concurrent with radiation therapy.  If delay in the definitive treatment is not desired, I have concerns that need to position 5-FU may result in excessive mucositis that may be very detrimental to the patient's quality of life and ability to tolerate the needed frequency of treatments. Nevertheless, if the patient and my colleagues desire to proceed with the treatment at this time, carboplatin and paclitaxel can be the systemic treatment combination to use.  Plan: --Consult IR for port & PEGT placement --RTC 2 weeks: labs, clinic visit and initiation of chemotherapy or chemoradiotherapy  Voice recognition software was used and creation of this note. Despite my best effort at editing the text, some misspelling/errors may have occurred.

## 2016-09-17 NOTE — Progress Notes (Signed)
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  No evidence of distal metastatic disease at this time based on PET/CT. Patient appears to be a good candidate for curative-intent therapy with combined chemotherapy-radiation modality. He is not a good candidate for cisplatin-based chemotherapy due to pre-existing hearing loss requiring hearing aids. In this situation, I prefer to use carboplatin--based therapy regimen.  Based on presence of ulcerated primary lesion, consideration can be made to neoadjuvant chemotherapy preceding the chemoradiotherapy approach which would potentially assist in healing the preexistent ulceration and reduce the impact of mucositis that will accompany combined treatment modality. Treatment may start with carboplatin/paclitaxel combination followed by carboplatin/5-FU concurrent with radiation therapy.  If delay in the definitive treatment is not desired, I have concerns that need to position 5-FU may result in excessive mucositis that may be very detrimental to the patient's quality of life and ability to tolerate the needed frequency of treatments. Nevertheless, if the patient and my colleagues desire to proceed with the treatment at this time, carboplatin and paclitaxel can be the systemic treatment combination to use.  Plan: --Consult IR for port & PEGT placement --RTC 2 weeks: labs, clinic visit and initiation of chemotherapy or chemoradiotherapy  Voice recognition software was used and creation of this note. Despite my best effort at editing the text, some misspelling/errors may have occurred.   Orders Placed This Encounter  Procedures  . IR FLUORO GUIDE PORT  INSERTION LEFT    Standing Status:   Future    Standing Expiration Date:   11/14/2017    Order Specific Question:   Reason for Exam (SYMPTOM  OR DIAGNOSIS REQUIRED)    Answer:   New head and neck cancer diagnosis, planned for CRT, pelase eval for port placement for chemotherapy administration    Order Specific Question:   Preferred Imaging Location?    Answer:   [facility]  . IR Gastrostomy Tube    Standing Status:   Future    Standing Expiration Date:   11/14/2017    Order Specific Question:   Reason for exam:    Answer:   New head and neck cancer diagnosis, planned for CRT, pelase eval for PEGT placement for nutritional support    Order Specific Question:   Preferred Imaging Location?    Answer:   [facility]  . CBC with Differential    Standing Status:   Future    Standing Expiration Date:   09/14/2017  . Comprehensive metabolic panel    Standing Status:   Future    Standing Expiration Date:   09/14/2017  . Magnesium    Standing Status:   Future    Standing Expiration Date:   09/14/2017  . Phosphorus    Standing Status:   Future    Standing Expiration Date:   09/14/2017    All questions were answered.  . The patient knows to call the clinic with any problems, questions or concerns.  This note was electronically signed.    History of Presenting Illness Joel Walton 71 y.o. 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 Please see oncological history for details.  Patient has initially presented to his primary care provider in July 2018 with sore throat. He was found to have a mass in the upper neck and underwent a biopsy by Dr. 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 Additional staging evaluation with PET/CT obtained on 09/07/16 demonstrated a large hypermetabolic mass in the right base of the tongue crossing the midline,  as well as hypermetabolic lymphadenopathy in bilateral level II lymph nodes. No evidence of distal metastatic disease identified. CT scan measured the mass at 3.9 cm by largest dimension with necrosis and ulceration. Patient denies any dysphagia, but does have some odynophagia and does complain of tingling and numbness over the right side of his face. He denies any chest pain, shortness of breath or cough. No nausea, vomiting, abdominal pain, diarrhea or constipation. No dysuria or hematuria. No neurological deficits. Patient does have pre-existing hearing loss and is wearing hearing aids. No history of any renal problems. Patient does not smoke and does not drink alcohol.  Medical History: Past Medical History:  Diagnosis Date  . Hyperlipidemia   . Hypertension     Surgical History: Past Surgical History:  Procedure Laterality Date  . ELBOW SURGERY Right   . KNEE SURGERY Bilateral    Not total knee replacements  . SHOULDER ARTHROSCOPY Right     Family History: History reviewed. No pertinent family history.  Social History: Social History   Social History  . Marital status: Married    Spouse name: N/A  . Number of children: 2  . Years of education: N/A   Occupational History  . Retired      Personal assistant   Social History Main Topics  . Smoking status: Former Smoker    Packs/day: 0.50  . Smokeless tobacco: Never Used     Comment: Quit in 1978  . Alcohol use Yes     Comment: 3-4 beers daily  . Drug use: No  . Sexual activity: Not Currently   Other Topics Concern  . Not on file   Social History Narrative  . No narrative on file    Allergies: No Known Allergies  Medications:  Current Outpatient Prescriptions  Medication Sig Dispense Refill  . aspirin 81 MG chewable tablet Chew by mouth daily.    Marland Kitchen HYDROcodone-acetaminophen (NORCO/VICODIN) 5-325 MG tablet   0  . lisinopril-hydrochlorothiazide (PRINZIDE,ZESTORETIC) 10-12.5 MG tablet Take 1 tablet by mouth  daily.   4  . lovastatin (MEVACOR) 20 MG tablet Take 40 mg by mouth daily.   3  . Multiple Vitamin (MULTIVITAMIN) tablet Take 1 tablet by mouth daily.    Marland Kitchen omeprazole (PRILOSEC) 20 MG capsule   0  . predniSONE (DELTASONE) 20 MG tablet 3 tabs poqday 1-2, 2 tabs poqday 3-4, 1 tab poqday 5-6 12 tablet 0  . lidocaine (XYLOCAINE) 2 % solution Patient: Mix 1part 2% viscous lidocaine, 1part H20. Swish & swallow 62m of diluted mixture, 343m before meals and at bedtime, up to QID 100 mL 5   No current facility-administered medications for this visit.     Review of Systems: Review of Systems  All other systems reviewed and are negative.    PHYSICAL EXAMINATION Blood pressure 96/63, pulse 96, temperature 98.3 F (36.8 C), temperature source Oral, resp. rate 18, height '5\' 9"'$  (1.753 m), weight 178 lb 11.2 oz (81.1 kg), SpO2 96 %.  ECOG PERFORMANCE STATUS: 1 - Symptomatic but completely ambulatory  Physical Exam  Constitutional: He is oriented to person,  place, and time and well-developed, well-nourished, and in no distress. No distress.  HENT:  Mouth/Throat: Oropharynx is clear and moist and mucous membranes are normal. No dental abscesses.  Edentulous, wearing dentures. No visible lesion noted no anterior oral ulcerations, leukoplakia. Visible oropharynx without abnormality.  Eyes: Pupils are equal, round, and reactive to light. Conjunctivae and EOM are normal. No scleral icterus.  Neck:  Palpable enlarged and hard lymph nodes in the right neck. He did not appear to be too other soft tissues.  Cardiovascular: Normal rate and regular rhythm.   No murmur heard. Pulmonary/Chest: Effort normal and breath sounds normal. No respiratory distress. He has no wheezes. He has no rales.  Abdominal: Soft. Bowel sounds are normal. He exhibits no distension and no mass. There is no tenderness.  Musculoskeletal: Normal range of motion. He exhibits no edema.  Neurological: He is alert and oriented to person,  place, and time. He has normal reflexes. No cranial nerve deficit. Coordination normal.  Skin: Skin is warm and dry. He is not diaphoretic.     LABORATORY DATA: I have personally reviewed the data as listed: Hospital Outpatient Visit on 09/14/2016  Component Date Value Ref Range Status  . BUN 09/14/2016 35.9* 7.0 - 26.0 mg/dL Final  . Creatinine 09/14/2016 1.1  0.7 - 1.3 mg/dL Final  . EGFR 09/14/2016 72* >90 ml/min/1.73 m2 Final   eGFR is calculated using the CKD-EPI Creatinine Equation (2009)        Ardath Sax, MD

## 2016-09-21 ENCOUNTER — Telehealth: Payer: Self-pay | Admitting: *Deleted

## 2016-09-21 NOTE — Telephone Encounter (Signed)
CALLED PATIENT TO ASK QUESTION, LVM FOR A RETURN CALL 

## 2016-09-22 ENCOUNTER — Telehealth: Payer: Self-pay | Admitting: *Deleted

## 2016-09-22 NOTE — Telephone Encounter (Signed)
Oncology Nurse Navigator Documentation  Returned Mr. Wanner's call, confirmed 0730 8/17 arrival to [facility] for PEG and [facility] placement, reminded him of NPO status beginning midnight.  I indicated I would arrange for next Monday's TSH to be drawn with pre-procedure labs on Friday.  He voiced understanding of information provided.  Gayleen Orem, RN, BSN, [place] [facility] Oncology Nurse [place] at [place] [phone]

## 2016-09-23 ENCOUNTER — Other Ambulatory Visit: Payer: Self-pay | Admitting: Radiology

## 2016-09-23 ENCOUNTER — Other Ambulatory Visit: Payer: Self-pay | Admitting: Hematology and Oncology

## 2016-09-23 ENCOUNTER — Telehealth: Payer: Self-pay

## 2016-09-23 ENCOUNTER — Telehealth: Payer: Self-pay | Admitting: *Deleted

## 2016-09-23 DIAGNOSIS — C770 Secondary and unspecified malignant neoplasm of lymph nodes of head, face and neck: Secondary | ICD-10-CM | POA: Insufficient documentation

## 2016-09-23 NOTE — Telephone Encounter (Signed)
Plan to start pt treatment next Friday (8/24). Labs, doctor, and infusion. Pt to see Dr. Alvy Bimler since Dr. Lebron Conners will be out of town.

## 2016-09-23 NOTE — Addendum Note (Signed)
Addended by: Ardath Sax on: 09/23/2016 04:22 PM   Modules accepted: Orders

## 2016-09-23 NOTE — Telephone Encounter (Addendum)
Oncology Nurse Navigator Documentation  Spoke with Anderson Malta, [place] IR, requested TSH scheduled for [facility] 8/20 be drawn tomorrow with labs preceding PAC/PEG placement.  She confirmed request will be directed to Short Stay.  Patient aware he does not need to arrive 8/20 for lab.  Gayleen Orem, RN, BSN, [place] Neck Oncology Nurse [place] at [place] [phone]

## 2016-09-24 ENCOUNTER — Telehealth: Payer: Self-pay | Admitting: Oncology

## 2016-09-24 ENCOUNTER — Ambulatory Visit (HOSPITAL_COMMUNITY)
Admission: RE | Admit: 2016-09-24 | Discharge: 2016-09-24 | Disposition: A | Discharge status: Home / Self Care | Payer: Medicare Other | Source: Ambulatory Visit | Attending: Interventional Radiology | Admitting: Interventional Radiology

## 2016-09-24 ENCOUNTER — Telehealth: Payer: Self-pay

## 2016-09-24 ENCOUNTER — Ambulatory Visit (HOSPITAL_COMMUNITY)
Admission: RE | Admit: 2016-09-24 | Discharge: 2016-09-24 | Disposition: A | Discharge status: Home / Self Care | Payer: Medicare Other | Source: Ambulatory Visit | Attending: Hematology and Oncology | Admitting: Hematology and Oncology

## 2016-09-24 ENCOUNTER — Other Ambulatory Visit: Payer: Self-pay | Admitting: Hematology and Oncology

## 2016-09-24 ENCOUNTER — Encounter (HOSPITAL_COMMUNITY): Payer: Self-pay

## 2016-09-24 ENCOUNTER — Telehealth: Payer: Self-pay | Admitting: Hematology and Oncology

## 2016-09-24 DIAGNOSIS — Z79899 Other long term (current) drug therapy: Secondary | ICD-10-CM | POA: Diagnosis not present

## 2016-09-24 DIAGNOSIS — Z87891 Personal history of nicotine dependence: Secondary | ICD-10-CM | POA: Diagnosis not present

## 2016-09-24 DIAGNOSIS — I10 Essential (primary) hypertension: Secondary | ICD-10-CM | POA: Insufficient documentation

## 2016-09-24 DIAGNOSIS — C01 Malignant neoplasm of base of tongue: Secondary | ICD-10-CM

## 2016-09-24 DIAGNOSIS — Z7982 Long term (current) use of aspirin: Secondary | ICD-10-CM | POA: Insufficient documentation

## 2016-09-24 DIAGNOSIS — E785 Hyperlipidemia, unspecified: Secondary | ICD-10-CM | POA: Diagnosis not present

## 2016-09-24 HISTORY — PX: IR GASTROSTOMY TUBE MOD SED: IMG625

## 2016-09-24 HISTORY — PX: IR FLUORO GUIDE PORT INSERTION RIGHT: IMG5741

## 2016-09-24 HISTORY — PX: IR US GUIDE VASC ACCESS RIGHT: IMG2390

## 2016-09-24 LAB — BASIC METABOLIC PANEL
Anion gap: 13 (ref 5–15)
BUN: 16 mg/dL (ref 6–20)
CALCIUM: 9.8 mg/dL (ref 8.9–10.3)
CO2: 27 mmol/L (ref 22–32)
CREATININE: 0.83 mg/dL (ref 0.61–1.24)
Chloride: 97 mmol/L — ABNORMAL LOW (ref 101–111)
GFR calc Af Amer: >60 mL/min (ref >60)
GLUCOSE: 102 mg/dL — AB (ref 65–99)
Potassium: 3.8 mmol/L (ref 3.5–5.1)
SODIUM: 137 mmol/L (ref 135–145)

## 2016-09-24 LAB — CBC WITH DIFFERENTIAL/PLATELET
BASOS ABS: 0 10*3/uL (ref 0.0–0.1)
BASOS PCT: 0 %
EOS ABS: 0.1 10*3/uL (ref 0.0–0.7)
EOS PCT: 1 %
HCT: 41.7 % (ref 39.0–52.0)
Hemoglobin: 14.6 g/dL (ref 13.0–17.0)
LYMPHS PCT: 17 %
Lymphs Abs: 1 10*3/uL (ref 0.7–4.0)
MCH: 31.3 pg (ref 26.0–34.0)
MCHC: 35 g/dL (ref 30.0–36.0)
MCV: 89.5 fL (ref 78.0–100.0)
MONO ABS: 0.5 10*3/uL (ref 0.1–1.0)
Monocytes Relative: 8 %
Neutro Abs: 4.5 10*3/uL (ref 1.7–7.7)
Neutrophils Relative %: 74 %
PLATELETS: 188 10*3/uL (ref 150–400)
RBC: 4.66 MIL/uL (ref 4.22–5.81)
RDW: 12.3 % (ref 11.5–15.5)
WBC: 6.1 10*3/uL (ref 4.0–10.5)

## 2016-09-24 LAB — PROTIME-INR
INR: 0.96
PROTHROMBIN TIME: 12.8 s (ref 11.4–15.2)

## 2016-09-24 LAB — TSH: TSH: 1.109 u[IU]/mL (ref 0.350–4.500)

## 2016-09-24 MED ORDER — MORPHINE SULFATE 15 MG PO TABS: 15.0000 mg | ORAL_TABLET | ORAL | 0 refills | Status: DC | PRN

## 2016-09-24 MED ORDER — GLUCAGON HCL RDNA (DIAGNOSTIC) 1 MG IJ SOLR
INTRAMUSCULAR | Status: AC | PRN
  Administered 2016-09-24: .5 mg via INTRAVENOUS

## 2016-09-24 MED ORDER — CEFAZOLIN SODIUM-DEXTROSE 2-4 GM/100ML-% IV SOLN
2.0000 g | INTRAVENOUS | Status: AC
  Administered 2016-09-24: 2 g via INTRAVENOUS

## 2016-09-24 MED ORDER — FENTANYL CITRATE (PF) 100 MCG/2ML IJ SOLN
INTRAMUSCULAR | Status: AC
  Filled 2016-09-24: qty 4

## 2016-09-24 MED ORDER — LIDOCAINE-EPINEPHRINE (PF) 2 %-1:200000 IJ SOLN
INTRAMUSCULAR | Status: AC
  Filled 2016-09-24: qty 40

## 2016-09-24 MED ORDER — CEFAZOLIN SODIUM-DEXTROSE 2-4 GM/100ML-% IV SOLN
INTRAVENOUS | Status: AC
  Filled 2016-09-24: qty 100

## 2016-09-24 MED ORDER — MIDAZOLAM HCL 2 MG/2ML IJ SOLN
INTRAMUSCULAR | Status: AC | PRN
  Administered 2016-09-24 (×3): 1 mg via INTRAVENOUS
  Administered 2016-09-24 (×2): 0.5 mg via INTRAVENOUS

## 2016-09-24 MED ORDER — LIDOCAINE-EPINEPHRINE (PF) 2 %-1:200000 IJ SOLN
INTRAMUSCULAR | Status: AC | PRN
  Administered 2016-09-24: 20 mL

## 2016-09-24 MED ORDER — IOPAMIDOL (ISOVUE-300) INJECTION 61%
INTRAVENOUS | Status: AC
  Filled 2016-09-24: qty 50

## 2016-09-24 MED ORDER — MIDAZOLAM HCL 2 MG/2ML IJ SOLN
INTRAMUSCULAR | Status: AC
  Filled 2016-09-24: qty 4

## 2016-09-24 MED ORDER — IOPAMIDOL (ISOVUE-300) INJECTION 61%
10.0000 mL | Freq: Once | INTRAVENOUS | Status: AC | PRN
  Administered 2016-09-24: 10 mL

## 2016-09-24 MED ORDER — FENTANYL CITRATE (PF) 100 MCG/2ML IJ SOLN
INTRAMUSCULAR | Status: AC | PRN
  Administered 2016-09-24: 50 ug via INTRAVENOUS
  Administered 2016-09-24: 25 ug via INTRAVENOUS
  Administered 2016-09-24 (×2): 50 ug via INTRAVENOUS
  Administered 2016-09-24: 25 ug via INTRAVENOUS

## 2016-09-24 MED ORDER — GLUCAGON HCL RDNA (DIAGNOSTIC) 1 MG IJ SOLR
INTRAMUSCULAR | Status: AC
  Filled 2016-09-24: qty 1

## 2016-09-24 MED ORDER — SODIUM CHLORIDE 0.9 % IV SOLN
INTRAVENOUS | Status: DC
  Administered 2016-09-24: 08:00:00 via INTRAVENOUS

## 2016-09-24 MED ORDER — FENTANYL CITRATE (PF) 100 MCG/2ML IJ SOLN
25.0000 ug | Freq: Once | INTRAMUSCULAR | Status: AC
  Administered 2016-09-24: 25 ug via INTRAVENOUS
  Filled 2016-09-24: qty 2

## 2016-09-24 MED ORDER — HEPARIN SOD (PORK) LOCK FLUSH 100 UNIT/ML IV SOLN
INTRAVENOUS | Status: AC
  Filled 2016-09-24: qty 5

## 2016-09-24 NOTE — Telephone Encounter (Signed)
Left voicemail for pt regarding the addition of some labs and appts with Dr.Gorsuch in August.   Sending him a reminder letter.

## 2016-09-24 NOTE — Sedation Documentation (Signed)
Patient is resting comfortably. 

## 2016-09-24 NOTE — Telephone Encounter (Signed)
Pt no longer going to take vicodin. Medication not helping. Daughter to p/u prescription for morphine this afternoon. Directions also given for constipation treatment OTC. Senna-S 17.2mg  and Docusate 200mg  bid. Add Miralax as needed 17-34G daily or twice daily. Pt or family member to call back if no BM or inadequate BM in three days.

## 2016-09-24 NOTE — Procedures (Signed)
TONGUE ca  S/p RT IJ POWER PORT  TIP SVCRA NO COMP STABLE EBL 0 READY FOR USE FULL REPORT IN PACS

## 2016-09-24 NOTE — Telephone Encounter (Signed)
Daughter here with questions regarding pt medication. Wilma, Network engineer told pt daughter that it may be a wait to speak with the RN as we are seeing pts today. This RN went to lobby at earliest convenience. Pt daughter had left and told Wilma that her mom, pt wife would call with questions.

## 2016-09-24 NOTE — Progress Notes (Signed)
Patient ID: Joel Walton, male   DOB: [birth year], 71 y.o.   MRN: [id number] Patient given prescription for Percocet 5/325, #10, no refills, 1-2 tablets every 4-6 hours as needed for moderate to severe pain status post gastrostomy tube placement.

## 2016-09-24 NOTE — Consult Note (Signed)
Chief Complaint: Patient was seen in consultation today for Port-A-Cath and percutaneous gastrostomy tube placements  Referring Physician(s): Perlov,Mikhail G  Supervising Physician: Daryll Brod  Patient Status: [place]  History of Present Illness: Joel Walton is a 71 y.o. 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  Past Medical History:  Diagnosis Date  . Hyperlipidemia   . Hypertension     Past Surgical History:  Procedure Laterality Date  . ELBOW SURGERY Right   . KNEE SURGERY Bilateral    Not total knee replacements  . SHOULDER ARTHROSCOPY Right     Allergies: Patient has no known allergies.  Medications: Prior to Admission medications   Medication Sig Start Date End Date Taking? Authorizing Provider  aspirin 81 MG chewable tablet Chew by mouth daily.    [provider]  HYDROcodone-acetaminophen (NORCO/VICODIN) 5-325 MG tablet  08/26/16   [provider]  lidocaine (XYLOCAINE) 2 % solution Patient: Mix 1part 2% viscous lidocaine, 1part H20. Swish & swallow 22mL of diluted mixture, 84min before meals and at bedtime, up to QID 09/15/16   Eppie Gibson, MD  lisinopril-hydrochlorothiazide (PRINZIDE,ZESTORETIC) 10-12.5 MG tablet Take 1 tablet by mouth daily.  08/29/16   [provider]  lovastatin (MEVACOR) 20 MG tablet Take 40 mg by mouth daily.  07/05/16   [provider]  Multiple Vitamin (MULTIVITAMIN) tablet Take 1 tablet by mouth daily.    [provider]  omeprazole (PRILOSEC) 20 MG capsule  08/09/16   [provider]  predniSONE (DELTASONE) 20 MG tablet 3 tabs poqday 1-2, 2 tabs poqday 3-4, 1 tab poqday 5-6 09/02/16   Susy Frizzle, MD     No family history on file.  Social History   Social History  . Marital status: Married   Spouse name: N/A  . Number of children: 2  . Years of education: N/A   Occupational History  . Retired      Personal assistant   Social History Main Topics  . Smoking status: Former Smoker    Packs/day: 0.50  . Smokeless tobacco: Never Used     Comment: Quit in 1978  . Alcohol use Yes     Comment: 3-4 beers daily  . Drug use: No  . Sexual activity: Not Currently   Other Topics Concern  . Not on file   Social History Narrative  . No narrative on file      Review of Systems :currently denies fever, chest pain, dyspnea, cough, abdominal/back pain; he does complain of headache, ear/eye/neck discomfort, constipation, dysphagia, odynophagia  Vital Signs:Blood pressure 115/81, heart rate 87, temperature 98.4, respirations 18, O2 sat 100% room air    Physical Exam awake, alert. Chest clear to auscultation bilaterally. Heart with regular rate and rhythm. Abdomen soft, positive bowel sounds, nontender. No lower extremity edema.  Mallampati Score:     Imaging: Ct Soft Tissue Neck W Contrast  Result Date: 09/01/2016 CLINICAL DATA:  Neoplasm of the tongue. EXAM: CT NECK WITH CONTRAST TECHNIQUE: Multidetector CT imaging of the neck was performed using the standard protocol following the bolus administration of intravenous contrast. CONTRAST:  39mL ISOVUE-300 IOPAMIDOL (ISOVUE-300) INJECTION 61% COMPARISON:  None. FINDINGS: Pharynx and larynx: There is a large tumor invasive at the right tongue base with ulceration. This has a maximal AP dimension of 3.9 cm, with a transverse dimension otherwise  of approximately 2.9 x 3.2 cm. The tumor extends down to the vallecula. There is probable involvement of the pre epiglottic fat. The tumor does cross the midline to the left. Salivary glands: Parotid and submandibular glands are normal. Small area of focal fatty atrophy in the left parotid gland. Thyroid: Normal Lymph nodes: Metastatic node right level 2 with internal low density measuring 3.2 x  2.5 x 1.6 cm. No other abnormal node on the right. On the left, there are questionable abnormal level 2 nodes, the more superior measuring 1.7 cm in length with short axis of 8 mm in the lower node measuring 2.9 cm in length with short axis of 8 mm. Vascular: Normal Limited intracranial: Normal Visualized orbits: Normal Mastoids and visualized paranasal sinuses: Clear/normal Skeleton: Advanced degenerative spondylosis. Upper chest: Negative Other: None IMPRESSION: Invasive tumor at the right tongue base skullbase with necrosis and ulceration. Approximate measurements are 3.9 x 2.9 x 3.2 cm. The lesion extends down to the vallecula. There is likely involvement of the pre epiglottic fat. Tumor does cross the midline to the left. Definite metastatic lymphadenopathy right level 2. Possible metastatic adenopathy left level 2. Electronically Signed   By: Nelson Chimes M.D.   On: 09/01/2016 09:50   Nm Pet Image Initial (pi) Skull Base To Thigh  Result Date: 09/07/2016 CLINICAL DATA:  Initial treatment strategy for base of tongue neoplasm RIGHT ear pain. Sore throat. EXAM: NUCLEAR MEDICINE PET SKULL BASE TO THIGH TECHNIQUE: 9.0 mCi F-18 FDG was injected intravenously. Full-ring PET imaging was performed from the skull base to thigh after the radiotracer. CT data was obtained and used for attenuation correction and anatomic localization. FASTING BLOOD GLUCOSE:  Value: 104 mg/dl COMPARISON:  Neck CT 09/01/2016 FINDINGS: NECK Mass lesion centered at the RIGHT base of tongue and extending to the LEFT base of tongue with intense metabolic activity (SUV max equal 20.1). Metabolic activity extends inferior to the level the vallecula and pre epiglottic fat. Enlarged hypermetabolic RIGHT level 2 lymph node with intense metabolic activity (SUV max equal 19.5) measures 17 mm short axis (image 28, series 4). Intense metabolic activity for size associated with smaller LEFT level 2 lymph nodes measuring 6 mm short axis on image 20,  series 4 and 7 mm short axis on image 30, series 4. These lymph nodes have SUV max of 3.2 and 3.4 respectively. CHEST No hypermetabolic mediastinal lymph nodes. No hypermetabolic pulmonary nodules. No suspicious pulmonary nodule on CT portion exam. ABDOMEN/PELVIS No abnormal hypermetabolic activity within the liver, pancreas, adrenal glands, or spleen. No hypermetabolic lymph nodes in the abdomen or pelvis. SKELETON No focal hypermetabolic activity to suggest skeletal metastasis. IMPRESSION: 1. Large hypermetabolic mass centered at the RIGHT base of tongue extending to the LEFT base of tongue. 2. Hypermetabolic RIGHT level II metastatic node. 3. Two  smaller hypermetabolic LEFT level II metastatic nodes. 4. No evidence of distant metastatic disease in the chest, abdomen, or pelvis. Electronically Signed   By: Suzy Bouchard M.D.   On: 09/07/2016 16:09    Labs:  CBC:  Recent Labs  09/24/16 0738  WBC 6.1  HGB 14.6  HCT 41.7  PLT 188    COAGS:  Recent Labs  09/24/16 0738  INR 0.96    BMP:  Recent Labs  09/14/16 1431 09/24/16 0738  NA  --  137  K  --  3.8  CL  --  97*  CO2  --  27  GLUCOSE  --  102*  BUN 35.9*  16  CALCIUM  --  9.8  CREATININE 1.1 0.83  GFRNONAA  --  >60  GFRAA  --  >60    LIVER FUNCTION TESTS: No results for input(s): BILITOT, AST, ALT, ALKPHOS, PROT, ALBUMIN in the last 8760 hours.  TUMOR MARKERS: No results for input(s): AFPTM, CEA, CA199, CHROMGRNA in the last 8760 hours.  Assessment and Plan: 71 y.o. 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     Thank you for this interesting consult.  I greatly enjoyed meeting [place] and look forward to participating in their care.  A copy of this report was sent to the requesting provider on this date.  Electronically Signed: D. Rowe Robert, PA-C 09/24/2016, 8:51 AM   I spent a total of 25 minutes    in face to face in clinical consultation, greater than 50% of which was counseling/coordinating care for Port-A-Cath and percutaneous gastrostomy tube placements

## 2016-09-24 NOTE — Procedures (Signed)
Tongue carcinoma with metastatic lymph nodes  Dysphagia  Status post fluoroscopic 20 French pull-through gastrostomy  Access ready for use tomorrow  EBL 0  No immediate complication  Full report in PACs

## 2016-09-24 NOTE — Sedation Documentation (Signed)
Patient denies pain and is resting comfortably.  

## 2016-09-24 NOTE — Discharge Instructions (Signed)
Moderate Conscious Sedation, Adult, Care After °These instructions provide you with information about caring for yourself after your procedure. Your health care provider may also give you more specific instructions. Your treatment has been planned according to current medical practices, but problems sometimes occur. Call your health care provider if you have any problems or questions after your procedure. °What can I expect after the procedure? °After your procedure, it is common: °· To feel sleepy for several hours. °· To feel clumsy and have poor balance for several hours. °· To have poor judgment for several hours. °· To vomit if you eat too soon. ° °Follow these instructions at home: °For at least 24 hours after the procedure: ° °· Do not: °? Participate in activities where you could fall or become injured. °? Drive. °? Use heavy machinery. °? Drink alcohol. °? Take sleeping pills or medicines that cause drowsiness. °? Make important decisions or sign legal documents. °? Take care of children on your own. °· Rest. °Eating and drinking °· Follow the diet recommended by your health care provider. °· If you vomit: °? Drink water, juice, or soup when you can drink without vomiting. °? Make sure you have little or no nausea before eating solid foods. °General instructions °· Have a responsible adult stay with you until you are awake and alert. °· Take over-the-counter and prescription medicines only as told by your health care provider. °· If you smoke, do not smoke without supervision. °· Keep all follow-up visits as told by your health care provider. This is important. °Contact a health care provider if: °· You keep feeling nauseous or you keep vomiting. °· You feel light-headed. °· You develop a rash. °· You have a fever. °Get help right away if: °· You have trouble breathing. °This information is not intended to replace advice given to you by your health care provider. Make sure you discuss any questions you have  with your health care provider. °Document Released: 11/15/2012 Document Revised: 06/30/2015 Document Reviewed: 05/17/2015 °Elsevier Interactive Patient Education © 2018 [employer] ° ° °Implanted Port Home Guide °An implanted port is a type of central line that is placed under the skin. Central lines are used to provide IV access when treatment or nutrition needs to be given through a person’s veins. Implanted ports are used for long-term IV access. An implanted port may be placed because: °· You need IV medicine that would be irritating to the small veins in your hands or arms. °· You need long-term IV medicines, such as antibiotics. °· You need IV nutrition for a long period. °· You need frequent blood draws for lab tests. °· You need dialysis. ° °Implanted ports are usually placed in the chest area, but they can also be placed in the upper arm, the abdomen, or the leg. An implanted port has two main parts: °· Reservoir. The reservoir is round and will appear as a small, raised area under your skin. The reservoir is the part where a needle is inserted to give medicines or draw blood. °· Catheter. The catheter is a thin, flexible tube that extends from the reservoir. The catheter is placed into a large vein. Medicine that is inserted into the reservoir goes into the catheter and then into the vein. ° °How will I care for my incision site? °Do not get the incision site wet. Bathe or shower as directed by your health care provider. °How is my port accessed? °Special steps must be taken to access the   port: °· Before the port is accessed, a numbing cream can be placed on the skin. This helps numb the skin over the port site. °· Your health care provider uses a sterile technique to access the port. °? Your health care provider must put on a mask and sterile gloves. °? The skin over your port is cleaned carefully with an antiseptic and allowed to dry. °? The port is gently pinched between sterile gloves, and a needle  is inserted into the port. °· Only "non-coring" port needles should be used to access the port. Once the port is accessed, a blood return should be checked. This helps ensure that the port is in the vein and is not clogged. °· If your port needs to remain accessed for a constant infusion, a clear (transparent) bandage will be placed over the needle site. The bandage and needle will need to be changed every week, or as directed by your health care provider. °· Keep the bandage covering the needle clean and dry. Do not get it wet. Follow your health care provider’s instructions on how to take a shower or bath while the port is accessed. °· If your port does not need to stay accessed, no bandage is needed over the port. ° °What is flushing? °Flushing helps keep the port from getting clogged. Follow your health care provider’s instructions on how and when to flush the port. Ports are usually flushed with saline solution or a medicine called heparin. The need for flushing will depend on how the port is used. °· If the port is used for intermittent medicines or blood draws, the port will need to be flushed: °? After medicines have been given. °? After blood has been drawn. °? As part of routine maintenance. °· If a constant infusion is running, the port may not need to be flushed. ° °How long will my port stay implanted? °The port can stay in for as long as your health care provider thinks it is needed. When it is time for the port to come out, surgery will be done to remove it. The procedure is similar to the one performed when the port was put in. °When should I seek immediate medical care? °When you have an implanted port, you should seek immediate medical care if: °· You notice a bad smell coming from the incision site. °· You have swelling, redness, or drainage at the incision site. °· You have more swelling or pain at the port site or the surrounding area. °· You have a fever that is not controlled with  medicine. ° °This information is not intended to replace advice given to you by your health care provider. Make sure you discuss any questions you have with your health care provider. °Document Released: 01/25/2005 Document Revised: 07/03/2015 Document Reviewed: 10/02/2012 °Elsevier Interactive Patient Education © 2017 Else[employer] Inc. ° ° °Implanted Port Insertion, Care After °This sheet gives you information about how to care for yourself after your procedure. Your health care provider may also give you more specific instructions. If you have problems or questions, contact your health care provider. °What can I expect after the procedure? °After your procedure, it is common to have: °· Discomfort at the port insertion site. °· Bruising on the skin over the port. This should improve over 3-4 days. ° °Follow these instructions at home: °Port care °· After your port is placed, you will get a manufacturer's information card. The card has information about your port. Keep this card with   you at all times.  Take care of the port as told by your health care provider. Ask your health care provider if you or a family member can get training for taking care of the port at home. A home health care nurse may also take care of the port.  Make sure to remember what type of port you have. Incision care  Follow instructions from your health care provider about how to take care of your port insertion site. Make sure you: ? Wash your hands with soap and water before you change your bandage (dressing). If soap and water are not available, use hand sanitizer. ? Change your dressing as told by your health care provider. ? Leave stitches (sutures), skin glue, or adhesive strips in place. These skin closures may need to stay in place for 2 weeks or longer. If adhesive strip edges start to loosen and curl up, you may trim the loose edges. Do not remove adhesive strips completely unless your health care provider tells you to do  that.  Check your port insertion site every day for signs of infection. Check for: ? More redness, swelling, or pain. ? More fluid or blood. ? Warmth. ? Pus or a bad smell. General instructions  Do not take baths, swim, or use a hot tub until your health care provider approves.  Do not lift anything that is heavier than 10 lb (4.5 kg) for a week, or as told by your health care provider.  Ask your health care provider when it is okay to: ? Return to work or school. ? Resume usual physical activities or sports.  Do not drive for 24 hours if you were given a medicine to help you relax (sedative).  Take over-the-counter and prescription medicines only as told by your health care provider.  Wear a medical alert bracelet in case of an emergency. This will tell any health care providers that you have a port.  Keep all follow-up visits as told by your health care provider. This is important. Contact a health care provider if:  You cannot flush your port with saline as directed, or you cannot draw blood from the port.  You have a fever or chills.  You have more redness, swelling, or pain around your port insertion site.  You have more fluid or blood coming from your port insertion site.  Your port insertion site feels warm to the touch.  You have pus or a bad smell coming from the port insertion site. Get help right away if:  You have chest pain or shortness of breath.  You have bleeding from your port that you cannot control. Summary  Take care of the port as told by your health care provider.  Change your dressing as told by your health care provider.  Keep all follow-up visits as told by your health care provider. This information is not intended to replace advice given to you by your health care provider. Make sure you discuss any questions you have with your health care provider. Document Released: 11/15/2012 Document Revised: 12/17/2015 Document Reviewed:  12/17/2015 Elsevier Interactive Patient Education  2017 [place].  Gastrostomy Tube, Care After Refer to this sheet in the next few weeks. These instructions provide you with information on caring for yourself after your procedure. Your health care provider may also give you more specific instructions. Your treatment has been planned according to current medical practices, but problems sometimes occur. Call your health care provider if you have any problems  or questions after your procedure. What can I expect after the procedure? After your procedure, it is typical to have the following:  Mild abdominal pain.  A small amount of blood-tinged fluid leaking from the site.  Follow these instructions at home:  You may resume your normal level of activity in 1 week.  You may have ice chips only for 24 hours after placement, then normal diet. Contact a health care provider if:  You have a fever or chills.  You have redness or irritation near the insertion site.  You continue to have abdominal pain or leaking around your gastrostomy tube. Get help right away if:  You develop bleeding or significant discharge around the tube.  You have severe abdominal pain.  Your new tube does not seem to be working properly.  You are unable to get feedings into the tube.  Your tube comes out for any reason. This information is not intended to replace advice given to you by your health care provider. Make sure you discuss any questions you have with your health care provider. Document Released: 08/22/2013 Document Revised: 07/03/2015 Document Reviewed: 06/06/2013 Elsevier Interactive Patient Education  2018 [place].   Gastrostomy Tube Home Guide, Adult A gastrostomy tube is a tube that is surgically placed into the stomach. It is also called a G-tube. G-tubes are used when a person is unable to eat and drink enough on their own to stay healthy. The tube is inserted into the stomach  through a small cut (incision) in the skin. This tube is used for:  Feeding.  Giving medication.  Gastrostomy tube care  Wash your hands with soap and water.  Remove the old dressing (if any). Some styles of G-tubes may need a dressing inserted between the skin and the G-tube. Other types of G-tubes do not require a dressing. Ask your health care provider if a dressing is needed.  Check the area where the tube enters the skin (insertion site) for redness, swelling, or pus-like (purulent) drainage. A small amount of clear or tan liquid drainage is normal. Check to make sure scar tissue (skin) is not growing around the insertion site. This could have a raised, bumpy appearance.  A cotton swab can be used to clean the skin around the tube: ? When the G-tube is first put in, a normal saline solution or water can be used to clean the skin. ? Mild soap and warm water can be used when the skin around the G-tube site has healed. ? Roll the cotton swab around the G-tube insertion site to remove any drainage or crusting at the insertion site. Stomach residuals Feeding tube residuals are the amount of liquids that are in the stomach at any given time. Residuals may be checked before giving feedings, medications, or as instructed by your health care provider.  Ask your health care provider if there are instances when you would not start tube feedings depending on the amount or type of contents withdrawn from the stomach.  Check residuals by attaching a syringe to the G-tube and pulling back on the syringe plunger. Note the amount, and return the residual back into the stomach.  Flushing the G-tube  The G-tube should be periodically flushed with clean warm water to keep it from clogging. ? Flush the G-tube after feedings or medications. Draw up 30 mL of warm water in a syringe. Connect the syringe to the G-tube and slowly push the water into the tube. ? Do not push feedings, medications,  or flushes  rapidly. Flush the G-tube gently and slowly. ? Only use syringes made for G-tubes to flush medications or feedings. ? Your health care provider may want the G-tube flushed more often or with more water. If this is the case, follow your health care provider's instructions. Feedings Your health care provider will determine whether feedings are given as a bolus (a certain amount given at one time and at scheduled times) or whether feedings will be given continuously on a feeding pump.  Formulas should be given at room temperature.  If feedings are continuous, no more than 4 hours worth of feedings should be placed in the feeding bag. This helps prevent spoilage or accidental excess infusion.  Cover and place unused formula in the refrigerator.  If feedings are continuous, stop the feedings when medications or flushes are given. Be sure to restart the feedings.  Feeding bags and syringes should be replaced as instructed by your health care provider.  Giving medication  In general, it is best if all medications are in a liquid form for G-tube administration. Liquid medications are less likely to clog the G-tube. ? Mix the liquid medication with 30 mL (or amount recommended by your health care provider) of warm water. ? Draw up the medication into the syringe. ? Attach the syringe to the G-tube and slowly push the mixture into the G-tube. ? After giving the medication, draw up 30 mL of warm water in the syringe and slowly flush the G-tube.  For pills or capsules, check with your health care provider first before crushing medications. Some pills are not effective if they are crushed. Some capsules are sustained-release medications. ? If appropriate, crush the pill or capsule and mix with 30 mL of warm water. Using the syringe, slowly push the medication through the tube, then flush the tube with another 30 mL of tap water. G-tube problems G-tube was pulled out.  Cause: May have been pulled out  accidentally.  Solutions: Cover the opening with clean dressing and tape. Call your health care provider right away. The G-tube should be put in as soon as possible (within 4 hours) so the G-tube opening (tract) does not close. The G-tube needs to be put in at a health care setting. An X-ray needs to be done to confirm placement before the G-tube can be used again.  Redness, irritation, soreness, or foul odor around the gastrostomy site.  Cause: May be caused by leakage or infection.  Solutions: Call your health care provider right away.  Large amount of leakage of fluid or mucus-like liquid present (a large amount means it soaks clothing).  Cause: Many reasons could cause the G-tube to leak.  Solutions: Call your health care provider to discuss the amount of leakage.  Skin or scar tissue appears to be growing where tube enters skin.  Cause: Tissue growth may develop around the insertion site if the G-tube is moved or pulled on excessively.  Solutions: Secure tube with tape so that excess movement does not occur. Call your health care provider.  G-tube is clogged.  Cause: Thick formula or medication.  Solutions: Try to slowly push warm water into the tube with a large syringe. Never try to push any object into the tube to unclog it. Do not force fluid into the G-tube. If you are unable to unclog the tube, call your health care provider right away.  Tips  Head of bed (HOB) position refers to the upright position of a person's upper body. ?  When giving medications or a feeding bolus, keep the [facility] up as told by your health care provider. Do this during the feeding and for 1 hour after the feeding or medication administration. ? If continuous feedings are being given, it is best to keep the [facility] up as told by your health care provider. When ADLs (activities of daily living) are performed and the [facility] needs to be flat, be sure to turn the feeding pump off. Restart the feeding pump when the  [facility] is returned to the recommended height.  Do not pull or put tension on the tube.  To prevent fluid backflow, kink the G-tube before removing the cap or disconnecting a syringe.  Check the G-tube length every day. Measure from the insertion site to the end of the G-tube. If the length is longer than previous measurements, the tube may be coming out. Call your health care provider if you notice increasing G-tube length.  Oral care, such as brushing teeth, must be continued.  You may need to remove excess air (vent) from the G-tube. Your health care provider will tell you if this is needed.  Always call your health care provider if you have questions or problems with the G-tube. Get help right away if:  You have severe abdominal pain, tenderness, or abdominal bloating (distension).  You have nausea or vomiting.  You are constipated or have problems moving your bowels.  The G-tube insertion site is red, swollen, has a foul smell, or has yellow or brown drainage.  You have difficulty breathing or shortness of breath.  You have a fever.  You have a large amount of feeding tube residuals.  The G-tube is clogged and cannot be flushed. This information is not intended to replace advice given to you by your health care provider. Make sure you discuss any questions you have with your health care provider. Document Released: 04/05/2001 Document Revised: 07/03/2015 Document Reviewed: 10/02/2012 Elsevier Interactive Patient Education  2017 [employer].

## 2016-09-27 ENCOUNTER — Telehealth: Payer: Self-pay | Admitting: Hematology and Oncology

## 2016-09-27 ENCOUNTER — Ambulatory Visit: Payer: Medicare Other

## 2016-09-27 DIAGNOSIS — Z510 Encounter for antineoplastic radiation therapy: Secondary | ICD-10-CM | POA: Diagnosis not present

## 2016-09-27 NOTE — Telephone Encounter (Signed)
sw pt to confirm addd chemo ed class 8/21 at noon per sch msg

## 2016-09-28 ENCOUNTER — Ambulatory Visit
Admission: RE | Admit: 2016-09-28 | Discharge: 2016-09-28 | Disposition: A | Discharge status: Home / Self Care | Payer: Medicare Other | Source: Ambulatory Visit | Attending: Radiation Oncology | Admitting: Radiation Oncology

## 2016-09-28 ENCOUNTER — Encounter: Payer: Self-pay | Admitting: *Deleted

## 2016-09-28 ENCOUNTER — Other Ambulatory Visit (HOSPITAL_BASED_OUTPATIENT_CLINIC_OR_DEPARTMENT_OTHER): Payer: Medicare Other

## 2016-09-28 ENCOUNTER — Telehealth: Payer: Self-pay | Admitting: Medical Oncology

## 2016-09-28 ENCOUNTER — Other Ambulatory Visit: Payer: Medicare Other

## 2016-09-28 DIAGNOSIS — Z510 Encounter for antineoplastic radiation therapy: Secondary | ICD-10-CM | POA: Diagnosis not present

## 2016-09-28 DIAGNOSIS — C01 Malignant neoplasm of base of tongue: Secondary | ICD-10-CM | POA: Diagnosis not present

## 2016-09-28 LAB — CBC WITH DIFFERENTIAL/PLATELET
BASO%: 0.2 % (ref 0.0–2.0)
BASOS ABS: 0 10*3/uL (ref 0.0–0.1)
EOS ABS: 0.1 10*3/uL (ref 0.0–0.5)
EOS%: 1 % (ref 0.0–7.0)
HEMATOCRIT: 40.5 % (ref 38.4–49.9)
HEMOGLOBIN: 13.6 g/dL (ref 13.0–17.1)
LYMPH#: 0.6 10*3/uL — AB (ref 0.9–3.3)
LYMPH%: 10.2 % — ABNORMAL LOW (ref 14.0–49.0)
MCH: 30.8 pg (ref 27.2–33.4)
MCHC: 33.6 g/dL (ref 32.0–36.0)
MCV: 91.6 fL (ref 79.3–98.0)
MONO#: 0.5 10*3/uL (ref 0.1–0.9)
MONO%: 8 % (ref 0.0–14.0)
NEUT%: 80.6 % — ABNORMAL HIGH (ref 39.0–75.0)
NEUTROS ABS: 4.8 10*3/uL (ref 1.5–6.5)
Platelets: 189 10*3/uL (ref 140–400)
RBC: 4.42 10*6/uL (ref 4.20–5.82)
RDW: 12.2 % (ref 11.0–14.6)
WBC: 6 10*3/uL (ref 4.0–10.3)

## 2016-09-28 LAB — MAGNESIUM: Magnesium: 2 mg/dl (ref 1.5–2.5)

## 2016-09-28 LAB — COMPREHENSIVE METABOLIC PANEL
ALBUMIN: 3.2 g/dL — AB (ref 3.5–5.0)
ALT: 22 U/L (ref 0–55)
AST: 21 U/L (ref 5–34)
Alkaline Phosphatase: 91 U/L (ref 40–150)
Anion Gap: 9 mEq/L (ref 3–11)
BUN: 13.1 mg/dL (ref 7.0–26.0)
CALCIUM: 10.6 mg/dL — AB (ref 8.4–10.4)
CO2: 30 mEq/L — ABNORMAL HIGH (ref 22–29)
CREATININE: 0.8 mg/dL (ref 0.7–1.3)
Chloride: 97 mEq/L — ABNORMAL LOW (ref 98–109)
EGFR: 89 mL/min/{1.73_m2} — AB (ref >90)
Glucose: 142 mg/dl — ABNORMAL HIGH (ref 70–140)
POTASSIUM: 4.1 meq/L (ref 3.5–5.1)
Sodium: 136 mEq/L (ref 136–145)
TOTAL PROTEIN: 7.4 g/dL (ref 6.4–8.3)
Total Bilirubin: 0.57 mg/dL (ref 0.20–1.20)

## 2016-09-28 NOTE — Telephone Encounter (Signed)
Left a message to remind Mr. Joel Walton of appointment for [facility] 10/05/16. I asked him to register and be in the radiation waiting area by 8:00am. I requested a return call to confirm he received my message.

## 2016-09-29 ENCOUNTER — Telehealth: Payer: Self-pay | Admitting: Hematology and Oncology

## 2016-09-29 ENCOUNTER — Ambulatory Visit
Admission: RE | Admit: 2016-09-29 | Discharge: 2016-09-29 | Disposition: A | Discharge status: Home / Self Care | Payer: Medicare Other | Source: Ambulatory Visit | Attending: Radiation Oncology | Admitting: Radiation Oncology

## 2016-09-29 ENCOUNTER — Encounter: Payer: Self-pay | Admitting: Hematology and Oncology

## 2016-09-29 ENCOUNTER — Ambulatory Visit (HOSPITAL_BASED_OUTPATIENT_CLINIC_OR_DEPARTMENT_OTHER): Payer: Medicare Other | Admitting: Hematology and Oncology

## 2016-09-29 VITALS — BP 101/72 | HR 89 | Temp 98.5°F | Resp 18 | Ht 69.0 in | Wt 170.3 lb

## 2016-09-29 DIAGNOSIS — R1310 Dysphagia, unspecified: Secondary | ICD-10-CM | POA: Diagnosis not present

## 2016-09-29 DIAGNOSIS — B370 Candidal stomatitis: Secondary | ICD-10-CM

## 2016-09-29 DIAGNOSIS — G893 Neoplasm related pain (acute) (chronic): Secondary | ICD-10-CM | POA: Diagnosis not present

## 2016-09-29 DIAGNOSIS — K5909 Other constipation: Secondary | ICD-10-CM | POA: Diagnosis not present

## 2016-09-29 DIAGNOSIS — R634 Abnormal weight loss: Secondary | ICD-10-CM | POA: Diagnosis not present

## 2016-09-29 DIAGNOSIS — C01 Malignant neoplasm of base of tongue: Secondary | ICD-10-CM | POA: Diagnosis not present

## 2016-09-29 DIAGNOSIS — C770 Secondary and unspecified malignant neoplasm of lymph nodes of head, face and neck: Secondary | ICD-10-CM | POA: Diagnosis not present

## 2016-09-29 DIAGNOSIS — E441 Mild protein-calorie malnutrition: Secondary | ICD-10-CM | POA: Insufficient documentation

## 2016-09-29 DIAGNOSIS — Z510 Encounter for antineoplastic radiation therapy: Secondary | ICD-10-CM | POA: Diagnosis not present

## 2016-09-29 LAB — PHOSPHORUS: Phosphorus, Ser: 2.8 mg/dL (ref 2.5–4.5)

## 2016-09-29 MED ORDER — SONAFINE EX EMUL
1.0000 "application " | Freq: Once | CUTANEOUS | Status: AC
  Administered 2016-09-29: 1 via TOPICAL

## 2016-09-29 MED ORDER — NYSTATIN 100000 UNIT/ML MT SUSP: 5.0000 mL | Freq: Four times a day (QID) | OROMUCOSAL | 0 refills | Status: DC

## 2016-09-29 MED ORDER — FENTANYL 25 MCG/HR TD PT72: 25.0000 ug | MEDICATED_PATCH | TRANSDERMAL | 0 refills | Status: DC

## 2016-09-29 MED ORDER — LIDOCAINE-PRILOCAINE 2.5-2.5 % EX CREA: TOPICAL_CREAM | CUTANEOUS | 3 refills | Status: DC

## 2016-09-29 MED ORDER — PROCHLORPERAZINE MALEATE 10 MG PO TABS: 10.0000 mg | ORAL_TABLET | Freq: Four times a day (QID) | ORAL | 1 refills | Status: DC | PRN

## 2016-09-29 MED ORDER — ONDANSETRON HCL 8 MG PO TABS: 8.0000 mg | ORAL_TABLET | Freq: Two times a day (BID) | ORAL | 1 refills | Status: DC | PRN

## 2016-09-29 MED ORDER — MORPHINE SULFATE (CONCENTRATE) 20 MG/ML PO SOLN: 20.0000 mg | ORAL | 0 refills | Status: DC | PRN

## 2016-09-29 MED FILL — MORPHINE SULF 100 MG/5 ML S: 100 | 10 days supply | Qty: 120 | Fill #0

## 2016-09-29 MED FILL — PROCHLORPERAZINE 10 MG TAB: 10 | 7 days supply | Qty: 30 | Fill #0

## 2016-09-29 MED FILL — NYSTATIN 100,000 UNITS/ML S: 100000 | 24 days supply | Qty: 473 | Fill #0

## 2016-09-29 NOTE — Assessment & Plan Note (Signed)
He has significant dysphagia due to pain and disease He has appointment scheduled to see speech and language therapist next week We discussed modified diet and I will switch his pain medicine to liquid morphine sulfate and fentanyl patch as needed

## 2016-09-29 NOTE — Assessment & Plan Note (Signed)
We discussed the role of chemotherapy. The intent is of curative intent.  We discussed some of the risks, benefits, side-effects of carboplatin and Paclitaxel  Some of the short term side-effects included, though not limited to, including weight loss, life threatening infections, risk of allergic reactions, need for transfusions of blood products, nausea, vomiting, change in bowel habits, loss of hair, admission to hospital for various reasons, and risks of death.   Long term side-effects are also discussed including risks of infertility, permanent damage to nerve function, hearing loss, chronic fatigue, kidney damage with possibility needing hemodialysis, and rare secondary malignancy including bone marrow disorders.  The patient is aware that the response rates discussed earlier is not guaranteed.  After a long discussion, patient made an informed decision to proceed with the prescribed plan of care.   Patient education material was dispensed. He will start first dose treatment this week I will see him on a weekly basis for supportive care.

## 2016-09-29 NOTE — Assessment & Plan Note (Addendum)
He had recent weight loss He has significant dysphagia I will try to see if dietitian can see him sooner In the meantime, we discussed calorie rich diet and increase supplement as tolerated. With recent weight loss, his blood pressure is now low I recommend discontinuation of blood pressure medication and his cholesterol medicine until his treatment is completed

## 2016-09-29 NOTE — Telephone Encounter (Signed)
Gave patient avs and calendar for upcoming appts.  °

## 2016-09-29 NOTE — Progress Notes (Signed)
[place] OFFICE PROGRESS NOTE  Patient Care Team: Lorene Dy, MD as PCP - General (Internal Medicine) Ardath Sax, MD as Consulting Physician (Hematology and Oncology) Eppie Gibson, MD as Attending Physician (Radiation Oncology) Leota Sauers, RN as Oncology Nurse Navigator (Oncology)  SUMMARY OF ONCOLOGIC HISTORY:   Cancer of base of tongue ([place])   08/26/2016 Pathology Results    Tongue biopsy confirmed squamous cell carcinoma      08/27/2016 Initial Diagnosis    Cancer of base of tongue [facility]): positive for p16      09/01/2016 Imaging    CT Neck: Large necrotic an ulcerating lesion at the right base of the tongue measuring 3.9 cm. Pathological lymphadenopathy in the right neck noted.      09/07/2016 Imaging    PET-CT: Large hypermetabolic mass at the right base of the tongue with extension across the midline. Significant hypermetabolic level II right-sided lymphadenopathy, possible pathological lymphadenopathy on the left side at level II.      09/24/2016 Procedure    Ultrasound and fluoroscopically guided right internal jugular single lumen power port catheter insertion. Tip in the SVC/RA junction. Catheter ready for use       INTERVAL HISTORY: Please see below for problem oriented charting. I am seeing him as his oncologist is out of town The patient is complaining of severe throat pain and dysphagia He was prescribed morphine sulfate which he felt beneficial His recent pain medication is causing significant constipation, resolved with laxatives He denies recent hemoptysis Since radiation started yesterday, he complained of some neck swelling He has poor oral intake and has lost some weight He has poor sleep as a result of that  REVIEW OF SYSTEMS:   Constitutional: Denies fevers, chills Eyes: Denies blurriness of vision Respiratory: Denies cough, dyspnea or wheezes Cardiovascular: Denies palpitation, chest discomfort or lower extremity  swelling Skin: Denies abnormal skin rashes Lymphatics: Denies new lymphadenopathy or easy bruising Neurological:Denies numbness, tingling or new weaknesses Behavioral/Psych: Mood is stable, no new changes  All other systems were reviewed with the patient and are negative.  I have reviewed the past medical history, past surgical history, social history and family history with the patient and they are unchanged from previous note.  ALLERGIES:  has No Known Allergies.  MEDICATIONS:  Current Outpatient Prescriptions  Medication Sig Dispense Refill  . aspirin 81 MG chewable tablet Chew by mouth daily.    . fentaNYL (DURAGESIC - DOSED MCG/HR) 25 MCG/HR patch Place 1 patch (25 mcg total) onto the skin every 3 (three) days. 5 patch 0  . lidocaine (XYLOCAINE) 2 % solution Patient: Mix 1part 2% viscous lidocaine, 1part H20. Swish & swallow 83mL of diluted mixture, 51min before meals and at bedtime, up to QID 100 mL 5  . lidocaine-prilocaine (EMLA) cream Apply to affected area once 30 g 3  . morphine (MSIR) 15 MG tablet Take 1 tablet (15 mg total) by mouth every 4 (four) hours as needed for severe pain. 30 tablet 0  . morphine (ROXANOL) 20 MG/ML concentrated solution Take 1 mL (20 mg total) by mouth every 2 (two) hours as needed for severe pain. 120 mL 0  . Multiple Vitamin (MULTIVITAMIN) tablet Take 1 tablet by mouth daily.    Marland Kitchen nystatin (MYCOSTATIN) 100000 UNIT/ML suspension Take 5 mLs (500,000 Units total) by mouth 4 (four) times daily. Swish and swallow if possible 473 mL 0  . omeprazole (PRILOSEC) 20 MG capsule   0  . ondansetron (ZOFRAN) 8  MG tablet Take 1 tablet (8 mg total) by mouth 2 (two) times daily as needed for refractory nausea / vomiting. Start on day 3 after chemo. 30 tablet 1  . predniSONE (DELTASONE) 20 MG tablet 3 tabs poqday 1-2, 2 tabs poqday 3-4, 1 tab poqday 5-6 12 tablet 0  . prochlorperazine (COMPAZINE) 10 MG tablet Take 1 tablet (10 mg total) by mouth every 6 (six) hours as  needed (Nausea or vomiting). 30 tablet 1   No current facility-administered medications for this visit.     PHYSICAL EXAMINATION: ECOG PERFORMANCE STATUS: 1 - Symptomatic but completely ambulatory  Vitals:   09/29/16 1458  BP: 101/72  Pulse: 89  Resp: 18  Temp: 98.5 F (36.9 C)  SpO2: 96%   Filed Weights   09/29/16 1458  Weight: 170 lb 4.8 oz (77.2 kg)    GENERAL:alert, no distress and comfortable SKIN: skin color, texture, turgor are normal, no rashes or significant lesions EYES: normal, Conjunctiva are pink and non-injected, sclera clear OROPHARYNX: Noted oral thrush. NECK: supple, thyroid normal size, non-tender, without nodularity LYMPH: He has palpable bilateral neck lymphadenopathy  LUNGS: clear to auscultation and percussion with normal breathing effort HEART: regular rate & rhythm and no murmurs and no lower extremity edema ABDOMEN:abdomen soft, non-tender and normal bowel sounds.  Feeding tube site looks okay Musculoskeletal:no cyanosis of digits and no clubbing.  Port site looks okay NEURO: alert & oriented x 3 with fluent speech, no focal motor/sensory deficits  LABORATORY DATA:  I have reviewed the data as listed    Component Value Date/Time   NA 136 09/28/2016 1139   K 4.1 09/28/2016 1139   CL 97 (L) 09/24/2016 0738   CO2 30 (H) 09/28/2016 1139   GLUCOSE 142 (H) 09/28/2016 1139   BUN 13.1 09/28/2016 1139   CREATININE 0.8 09/28/2016 1139   CALCIUM 10.6 (H) 09/28/2016 1139   PROT 7.4 09/28/2016 1139   ALBUMIN 3.2 (L) 09/28/2016 1139   AST 21 09/28/2016 1139   ALT 22 09/28/2016 1139   ALKPHOS 91 09/28/2016 1139   BILITOT 0.57 09/28/2016 1139   GFRNONAA >60 09/24/2016 0738   GFRAA >60 09/24/2016 0738    No results found for: SPEP, UPEP  Lab Results  Component Value Date   WBC 6.0 09/28/2016   NEUTROABS 4.8 09/28/2016   HGB 13.6 09/28/2016   HCT 40.5 09/28/2016   MCV 91.6 09/28/2016   PLT 189 09/28/2016      Chemistry      Component Value  Date/Time   NA 136 09/28/2016 1139   K 4.1 09/28/2016 1139   CL 97 (L) 09/24/2016 0738   CO2 30 (H) 09/28/2016 1139   BUN 13.1 09/28/2016 1139   CREATININE 0.8 09/28/2016 1139      Component Value Date/Time   CALCIUM 10.6 (H) 09/28/2016 1139   ALKPHOS 91 09/28/2016 1139   AST 21 09/28/2016 1139   ALT 22 09/28/2016 1139   BILITOT 0.57 09/28/2016 1139       RADIOGRAPHIC STUDIES: I have personally reviewed the radiological images as listed and agreed with the findings in the report. Ct Soft Tissue Neck W Contrast  Result Date: 09/01/2016 CLINICAL DATA:  Neoplasm of the tongue. EXAM: CT NECK WITH CONTRAST TECHNIQUE: Multidetector CT imaging of the neck was performed using the standard protocol following the bolus administration of intravenous contrast. CONTRAST:  96mL ISOVUE-300 IOPAMIDOL (ISOVUE-300) INJECTION 61% COMPARISON:  None. FINDINGS: Pharynx and larynx: There is a large tumor invasive at the  right tongue base with ulceration. This has a maximal AP dimension of 3.9 cm, with a transverse dimension otherwise of approximately 2.9 x 3.2 cm. The tumor extends down to the vallecula. There is probable involvement of the pre epiglottic fat. The tumor does cross the midline to the left. Salivary glands: Parotid and submandibular glands are normal. Small area of focal fatty atrophy in the left parotid gland. Thyroid: Normal Lymph nodes: Metastatic node right level 2 with internal low density measuring 3.2 x 2.5 x 1.6 cm. No other abnormal node on the right. On the left, there are questionable abnormal level 2 nodes, the more superior measuring 1.7 cm in length with short axis of 8 mm in the lower node measuring 2.9 cm in length with short axis of 8 mm. Vascular: Normal Limited intracranial: Normal Visualized orbits: Normal Mastoids and visualized paranasal sinuses: Clear/normal Skeleton: Advanced degenerative spondylosis. Upper chest: Negative Other: None IMPRESSION: Invasive tumor at the right  tongue base skullbase with necrosis and ulceration. Approximate measurements are 3.9 x 2.9 x 3.2 cm. The lesion extends down to the vallecula. There is likely involvement of the pre epiglottic fat. Tumor does cross the midline to the left. Definite metastatic lymphadenopathy right level 2. Possible metastatic adenopathy left level 2. Electronically Signed   By: Nelson Chimes M.D.   On: 09/01/2016 09:50   Ir Gastrostomy Tube  Result Date: 09/24/2016 INDICATION: Tonsillar carcinoma, dysphagia EXAM: FLUOROSCOPIC 20 FRENCH PULL-THROUGH GASTROSTOMY Date:  8/17/20188/17/2018 11:24 am Radiologist:  M. Daryll Brod, MD Guidance:  Fluoroscopic MEDICATIONS: Ancef 2 g; Antibiotics were administered within 1 hour of the procedure. Glucagon 0.5 mg IV ANESTHESIA/SEDATION: Versed 2.0 mg IV; Fentanyl 100 mcg IV Moderate Sedation Time:  18 The patient was continuously monitored during the procedure by the interventional radiology nurse under my direct supervision. CONTRAST:  50mL ISOVUE-300 IOPAMIDOL (ISOVUE-300) INJECTION 61% - administered into the gastric lumen. FLUOROSCOPY TIME:  Fluoroscopy Time: 2 minutes 24 seconds (55 mGy). COMPLICATIONS: None immediate. PROCEDURE: Informed consent was obtained from the patient following explanation of the procedure, risks, benefits and alternatives. The patient understands, agrees and consents for the procedure. All questions were addressed. A time out was performed. Maximal barrier sterile technique utilized including caps, mask, sterile gowns, sterile gloves, large sterile drape, hand hygiene, and betadine prep. The left upper quadrant was sterilely prepped and draped. An oral gastric catheter was inserted into the stomach under fluoroscopy. The existing nasogastric feeding tube was removed. Air was injected into the stomach for insufflation and visualization under fluoroscopy. The air distended stomach was confirmed beneath the anterior abdominal wall in the frontal and lateral  projections. Under sterile conditions and local anesthesia, a 34 gauge trocar needle was utilized to access the stomach percutaneously beneath the left subcostal margin. Needle position was confirmed within the stomach under biplane fluoroscopy. Contrast injection confirmed position also. A single T tack was deployed for gastropexy. Over an Amplatz guide wire, a 9-French sheath was inserted into the stomach. A snare device was utilized to capture the oral gastric catheter. The snare device was pulled retrograde from the stomach up the esophagus and out the oropharynx. The 20-French pull-through gastrostomy was connected to the snare device and pulled antegrade through the oropharynx down the esophagus into the stomach and then through the percutaneous tract external to the patient. The gastrostomy was assembled externally. Contrast injection confirms position in the stomach. Images were obtained for documentation. The patient tolerated procedure well. No immediate complication. IMPRESSION: Fluoroscopic insertion of a  20-French "pull-through" gastrostomy. Electronically Signed   By: Jerilynn Mages.  Shick M.D.   On: 09/24/2016 11:31   Nm Pet Image Initial (pi) Skull Base To Thigh  Result Date: 09/07/2016 CLINICAL DATA:  Initial treatment strategy for base of tongue neoplasm RIGHT ear pain. Sore throat. EXAM: NUCLEAR MEDICINE PET SKULL BASE TO THIGH TECHNIQUE: 9.0 mCi F-18 FDG was injected intravenously. Full-ring PET imaging was performed from the skull base to thigh after the radiotracer. CT data was obtained and used for attenuation correction and anatomic localization. FASTING BLOOD GLUCOSE:  Value: 104 mg/dl COMPARISON:  Neck CT 09/01/2016 FINDINGS: NECK Mass lesion centered at the RIGHT base of tongue and extending to the LEFT base of tongue with intense metabolic activity (SUV max equal 20.1). Metabolic activity extends inferior to the level the vallecula and pre epiglottic fat. Enlarged hypermetabolic RIGHT level 2  lymph node with intense metabolic activity (SUV max equal 19.5) measures 17 mm short axis (image 28, series 4). Intense metabolic activity for size associated with smaller LEFT level 2 lymph nodes measuring 6 mm short axis on image 20, series 4 and 7 mm short axis on image 30, series 4. These lymph nodes have SUV max of 3.2 and 3.4 respectively. CHEST No hypermetabolic mediastinal lymph nodes. No hypermetabolic pulmonary nodules. No suspicious pulmonary nodule on CT portion exam. ABDOMEN/PELVIS No abnormal hypermetabolic activity within the liver, pancreas, adrenal glands, or spleen. No hypermetabolic lymph nodes in the abdomen or pelvis. SKELETON No focal hypermetabolic activity to suggest skeletal metastasis. IMPRESSION: 1. Large hypermetabolic mass centered at the RIGHT base of tongue extending to the LEFT base of tongue. 2. Hypermetabolic RIGHT level II metastatic node. 3. Two  smaller hypermetabolic LEFT level II metastatic nodes. 4. No evidence of distant metastatic disease in the chest, abdomen, or pelvis. Electronically Signed   By: Suzy Bouchard M.D.   On: 09/07/2016 16:09   Ir US Guide Vasc Access Right  Result Date: 09/24/2016 CLINICAL DATA:  Tonsillar carcinoma EXAM: RIGHT INTERNAL JUGULAR SINGLE LUMEN POWER PORT CATHETER INSERTION Date:  8/17/20188/17/2018 10:57 am Radiologist:  M. Daryll Brod, MD Guidance:  Ultrasound and fluoroscopic MEDICATIONS: Ancef 2 g; The antibiotic was administered within an appropriate time interval prior to skin puncture. ANESTHESIA/SEDATION: Versed 2.0 mg IV; Fentanyl 100 mcg IV; Moderate Sedation Time:  31 minutes The patient was continuously monitored during the procedure by the interventional radiology nurse under my direct supervision. FLUOROSCOPY TIME:  36 seconds (7 mGy) COMPLICATIONS: None immediate. CONTRAST:  None. PROCEDURE: Informed consent was obtained from the patient following explanation of the procedure, risks, benefits and alternatives. The patient  understands, agrees and consents for the procedure. All questions were addressed. A time out was performed. Maximal barrier sterile technique utilized including caps, mask, sterile gowns, sterile gloves, large sterile drape, hand hygiene, and 2% chlorhexidine scrub. Under sterile conditions and local anesthesia, right internal jugular micropuncture venous access was performed. Access was performed with ultrasound. Images were obtained for documentation. A guide wire was inserted followed by a transitional dilator. This allowed insertion of a guide wire and catheter into the IVC. Measurements were obtained from the SVC / RA junction back to the right IJ venotomy site. In the right infraclavicular chest, a subcutaneous pocket was created over the second anterior rib. This was done under sterile conditions and local anesthesia. 1% lidocaine with epinephrine was utilized for this. A 2.5 cm incision was made in the skin. Blunt dissection was performed to create a subcutaneous pocket over the right  pectoralis major muscle. The pocket was flushed with saline vigorously. There was adequate hemostasis. The port catheter was assembled and checked for leakage. The port catheter was secured in the pocket with two retention sutures. The tubing was tunneled subcutaneously to the right venotomy site and inserted into the SVC/RA junction through a valved peel-away sheath. Position was confirmed with fluoroscopy. Images were obtained for documentation. The patient tolerated the procedure well. No immediate complications. Incisions were closed in a two layer fashion with 4 - 0 Vicryl suture. Dermabond was applied to the skin. The port catheter was accessed, blood was aspirated followed by saline and heparin flushes. Needle was removed. A dry sterile dressing was applied. IMPRESSION: Ultrasound and fluoroscopically guided right internal jugular single lumen power port catheter insertion. Tip in the SVC/RA junction. Catheter ready for  use. Electronically Signed   By: Jerilynn Mages.  Shick M.D.   On: 09/24/2016 11:32   Ir Fluoro Guide Port Insertion Right  Result Date: 09/24/2016 CLINICAL DATA:  Tonsillar carcinoma EXAM: RIGHT INTERNAL JUGULAR SINGLE LUMEN POWER PORT CATHETER INSERTION Date:  8/17/20188/17/2018 10:57 am Radiologist:  M. Daryll Brod, MD Guidance:  Ultrasound and fluoroscopic MEDICATIONS: Ancef 2 g; The antibiotic was administered within an appropriate time interval prior to skin puncture. ANESTHESIA/SEDATION: Versed 2.0 mg IV; Fentanyl 100 mcg IV; Moderate Sedation Time:  31 minutes The patient was continuously monitored during the procedure by the interventional radiology nurse under my direct supervision. FLUOROSCOPY TIME:  36 seconds (7 mGy) COMPLICATIONS: None immediate. CONTRAST:  None. PROCEDURE: Informed consent was obtained from the patient following explanation of the procedure, risks, benefits and alternatives. The patient understands, agrees and consents for the procedure. All questions were addressed. A time out was performed. Maximal barrier sterile technique utilized including caps, mask, sterile gowns, sterile gloves, large sterile drape, hand hygiene, and 2% chlorhexidine scrub. Under sterile conditions and local anesthesia, right internal jugular micropuncture venous access was performed. Access was performed with ultrasound. Images were obtained for documentation. A guide wire was inserted followed by a transitional dilator. This allowed insertion of a guide wire and catheter into the IVC. Measurements were obtained from the SVC / RA junction back to the right IJ venotomy site. In the right infraclavicular chest, a subcutaneous pocket was created over the second anterior rib. This was done under sterile conditions and local anesthesia. 1% lidocaine with epinephrine was utilized for this. A 2.5 cm incision was made in the skin. Blunt dissection was performed to create a subcutaneous pocket over the right pectoralis major  muscle. The pocket was flushed with saline vigorously. There was adequate hemostasis. The port catheter was assembled and checked for leakage. The port catheter was secured in the pocket with two retention sutures. The tubing was tunneled subcutaneously to the right venotomy site and inserted into the SVC/RA junction through a valved peel-away sheath. Position was confirmed with fluoroscopy. Images were obtained for documentation. The patient tolerated the procedure well. No immediate complications. Incisions were closed in a two layer fashion with 4 - 0 Vicryl suture. Dermabond was applied to the skin. The port catheter was accessed, blood was aspirated followed by saline and heparin flushes. Needle was removed. A dry sterile dressing was applied. IMPRESSION: Ultrasound and fluoroscopically guided right internal jugular single lumen power port catheter insertion. Tip in the SVC/RA junction. Catheter ready for use. Electronically Signed   By: Jerilynn Mages.  Shick M.D.   On: 09/24/2016 11:32    ASSESSMENT & PLAN:  Cancer of base of tongue (  [place]) We discussed the role of chemotherapy. The intent is of curative intent.  We discussed some of the risks, benefits, side-effects of carboplatin and Paclitaxel  Some of the short term side-effects included, though not limited to, including weight loss, life threatening infections, risk of allergic reactions, need for transfusions of blood products, nausea, vomiting, change in bowel habits, loss of hair, admission to hospital for various reasons, and risks of death.   Long term side-effects are also discussed including risks of infertility, permanent damage to nerve function, hearing loss, chronic fatigue, kidney damage with possibility needing hemodialysis, and rare secondary malignancy including bone marrow disorders.  The patient is aware that the response rates discussed earlier is not guaranteed.  After a long discussion, patient made an informed decision to proceed with  the prescribed plan of care.   Patient education material was dispensed. He will start first dose treatment this week I will see him on a weekly basis for supportive care.    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  Thrush, oral He has mild oral thrush I recommend nystatin swish and swallow He also have significant dry mouth and encourage oral intake of liquids as tolerated  Cancer associated pain He has poorly controlled pain and have difficulty swallowing pills I recommend Roxanol as needed I will reassess pain control next week If pain is worse, he is recommended to start fentanyl patch  Other constipation He had recent constipation due to narcotic prescription Reinforced the importance of taking laxatives on a regular basis  Weight loss He had recent weight loss He has significant dysphagia I will try to see if dietitian can see him sooner In the meantime, we discussed calorie rich diet and increase supplement as tolerated. With recent weight loss, his blood pressure is now low I recommend discontinuation of blood pressure medication and his cholesterol medicine until his treatment is completed  Dysphagia He has significant dysphagia due to pain and disease He has appointment scheduled to see speech and language therapist next week We discussed modified diet and I will switch his pain medicine to liquid morphine sulfate and fentanyl patch as needed   Orders Placed This Encounter  Procedures  . CBC with Differential    Standing Status:   Standing    Number of Occurrences:   20    Standing Expiration Date:   09/30/2017  . Comprehensive metabolic panel    Standing Status:   Standing    Number of Occurrences:   20    Standing Expiration Date:   09/30/2017   All questions were answered. The patient knows to call  the clinic with any problems, questions or concerns. No barriers to learning was detected. I spent 30 minutes counseling the patient face to face. The total time spent in the appointment was 40 minutes and more than 50% was on counseling and review of test results     Heath Lark, MD 09/29/2016 3:52 PM

## 2016-09-29 NOTE — Assessment & Plan Note (Signed)
He has mild oral thrush I recommend nystatin swish and swallow He also have significant dry mouth and encourage oral intake of liquids as tolerated

## 2016-09-29 NOTE — Assessment & Plan Note (Signed)
He perceived mild neck swelling since radiation started I recommend low-dose prednisone 20 mg daily to reduce the swelling and I plan to reassess next week.

## 2016-09-29 NOTE — Progress Notes (Signed)
Pt here for patient teaching.  Pt given Radiation and You booklet, Managing Acute Radiation Side Effects for Head and Neck Cancer handout, skin care instructions and Sonafine.  Reviewed areas of pertinence such as fatigue, hair loss, mouth changes, skin changes, throat changes and taste changes . Pt able to give teach back of to pat skin, use unscented/gentle soap and drink plenty of water,apply Sonafine bid, avoid applying anything to skin within 4 hours of treatment and to use an electric razor if they must shave. Pt verbalizes understanding of information given and will contact nursing with any questions or concerns.     [email or web address]/[email or web address]      

## 2016-09-29 NOTE — Assessment & Plan Note (Signed)
He has poorly controlled pain and have difficulty swallowing pills I recommend Roxanol as needed I will reassess pain control next week If pain is worse, he is recommended to start fentanyl patch

## 2016-09-29 NOTE — Assessment & Plan Note (Signed)
He had recent constipation due to narcotic prescription Reinforced the importance of taking laxatives on a regular basis

## 2016-09-30 ENCOUNTER — Encounter: Payer: Self-pay | Admitting: Hematology and Oncology

## 2016-09-30 ENCOUNTER — Ambulatory Visit
Admission: RE | Admit: 2016-09-30 | Discharge: 2016-09-30 | Disposition: A | Discharge status: Home / Self Care | Payer: Medicare Other | Source: Ambulatory Visit | Attending: Radiation Oncology | Admitting: Radiation Oncology

## 2016-09-30 MED FILL — ONDANSETRON HCL 8 MG TAB: 8 | 15 days supply | Qty: 30 | Fill #0

## 2016-09-30 NOTE — Progress Notes (Signed)
Received PA request for Fentanyl patch, Morphine, and Ondansetron.  Called [employer](Aretha T)@[phone] to initiate PA. She will fax the forms to be completed and faxed back for request.

## 2016-09-30 NOTE — Progress Notes (Signed)
Obtained clinical answers and signature from physician.  Faxed forms to [name]. Fax received ok per confirmation sheet.

## 2016-10-01 ENCOUNTER — Ambulatory Visit
Admission: RE | Admit: 2016-10-01 | Discharge: 2016-10-01 | Disposition: A | Discharge status: Home / Self Care | Payer: Medicare Other | Source: Ambulatory Visit | Attending: Radiation Oncology | Admitting: Radiation Oncology

## 2016-10-01 ENCOUNTER — Ambulatory Visit (HOSPITAL_BASED_OUTPATIENT_CLINIC_OR_DEPARTMENT_OTHER): Payer: Medicare Other

## 2016-10-01 ENCOUNTER — Encounter: Payer: Self-pay | Admitting: Hematology and Oncology

## 2016-10-01 ENCOUNTER — Telehealth: Payer: Self-pay | Admitting: Medical Oncology

## 2016-10-01 ENCOUNTER — Other Ambulatory Visit: Payer: Self-pay | Admitting: Medical Oncology

## 2016-10-01 ENCOUNTER — Ambulatory Visit: Payer: Medicare Other | Admitting: Nutrition

## 2016-10-01 VITALS — BP 136/82 | HR 64 | Temp 98.6°F | Resp 16

## 2016-10-01 DIAGNOSIS — Z510 Encounter for antineoplastic radiation therapy: Secondary | ICD-10-CM | POA: Diagnosis not present

## 2016-10-01 DIAGNOSIS — C01 Malignant neoplasm of base of tongue: Secondary | ICD-10-CM

## 2016-10-01 DIAGNOSIS — C770 Secondary and unspecified malignant neoplasm of lymph nodes of head, face and neck: Secondary | ICD-10-CM

## 2016-10-01 DIAGNOSIS — Z5111 Encounter for antineoplastic chemotherapy: Secondary | ICD-10-CM | POA: Diagnosis not present

## 2016-10-01 MED ORDER — SODIUM CHLORIDE 0.9% FLUSH
10.0000 mL | INTRAVENOUS | Status: DC | PRN
  Administered 2016-10-01: 10 mL
  Filled 2016-10-01: qty 10

## 2016-10-01 MED ORDER — FAMOTIDINE IN NACL 20-0.9 MG/50ML-% IV SOLN
20.0000 mg | Freq: Once | INTRAVENOUS | Status: AC
  Administered 2016-10-01: 20 mg via INTRAVENOUS

## 2016-10-01 MED ORDER — SODIUM CHLORIDE 0.9 % IV SOLN
Freq: Once | INTRAVENOUS | Status: AC
  Administered 2016-10-01: 13:00:00 via INTRAVENOUS

## 2016-10-01 MED ORDER — PALONOSETRON HCL INJECTION 0.25 MG/5ML
INTRAVENOUS | Status: AC
  Filled 2016-10-01: qty 5

## 2016-10-01 MED ORDER — PALONOSETRON HCL INJECTION 0.25 MG/5ML
0.2500 mg | Freq: Once | INTRAVENOUS | Status: AC
  Administered 2016-10-01: 0.25 mg via INTRAVENOUS

## 2016-10-01 MED ORDER — FAMOTIDINE IN NACL 20-0.9 MG/50ML-% IV SOLN
INTRAVENOUS | Status: AC
  Filled 2016-10-01: qty 50

## 2016-10-01 MED ORDER — SODIUM CHLORIDE 0.9 % IV SOLN
210.0000 mg | Freq: Once | INTRAVENOUS | Status: AC
  Administered 2016-10-01: 210 mg via INTRAVENOUS
  Filled 2016-10-01: qty 21

## 2016-10-01 MED ORDER — MORPHINE SULFATE 15 MG PO TABS: 15.0000 mg | ORAL_TABLET | ORAL | 0 refills | Status: DC | PRN

## 2016-10-01 MED ORDER — DIPHENHYDRAMINE HCL 50 MG/ML IJ SOLN
INTRAMUSCULAR | Status: AC
  Filled 2016-10-01: qty 1

## 2016-10-01 MED ORDER — LIDOCAINE-PRILOCAINE 2.5-2.5 % EX CREA
TOPICAL_CREAM | CUTANEOUS | Status: AC
  Filled 2016-10-01: qty 5

## 2016-10-01 MED ORDER — DEXTROSE 5 % IV SOLN
45.0000 mg/m2 | Freq: Once | INTRAVENOUS | Status: AC
  Administered 2016-10-01: 90 mg via INTRAVENOUS
  Filled 2016-10-01: qty 15

## 2016-10-01 MED ORDER — DIPHENHYDRAMINE HCL 50 MG/ML IJ SOLN
50.0000 mg | Freq: Once | INTRAMUSCULAR | Status: AC
  Administered 2016-10-01: 50 mg via INTRAVENOUS

## 2016-10-01 MED ORDER — DEXAMETHASONE SODIUM PHOSPHATE 100 MG/10ML IJ SOLN
20.0000 mg | Freq: Once | INTRAMUSCULAR | Status: AC
  Administered 2016-10-01: 20 mg via INTRAVENOUS
  Filled 2016-10-01: qty 2

## 2016-10-01 MED ORDER — HEPARIN SOD (PORK) LOCK FLUSH 100 UNIT/ML IV SOLN
500.0000 [IU] | Freq: Once | INTRAVENOUS | Status: AC | PRN
  Administered 2016-10-01: 500 [IU]
  Filled 2016-10-01: qty 5

## 2016-10-01 MED FILL — fentaNYL 25 MCG/HR PT72: 25 | 15 days supply | Qty: 5 | Fill #0

## 2016-10-01 NOTE — Progress Notes (Signed)
70 year old male diagnosed with tongue cancer.  He is a patient of Dr. Isidore Moos of [facility].  Past medical history includes hyperlipidemia, hypertension, tobacco, decreased bilateral hearing.  Medications include multivitamin, Prilosec, and prednisone.  Labs include glucose 142 and albumin 3.2 on August 21.  Height: 5 feet 9 inches. Weight: 170.3 pounds. BMI: 25.15.  Patient is status post PEG placement. Reports he does have difficulty swallowing and has been using Ensure Plus in his feeding tube.  He usually gives 3-4 bottles daily. Patient reports constipation.  Reports medication is helping. Physical exam was deferred secondary to patient receiving nursing care.  Estimated nutrition needs: 2200-2400 calories, 90-110 grams protein, 2.4 L fluid.  When patient no longer able to swallow liquids or solids, patient will require 6.5 cans of Osmolite 1.5 daily to provide 2307 cal, 97 g protein, 1177 mL free water.  Nutrition diagnosis:  Inadequate oral intake related to tongue cancer as evidenced by greater than 8 pound weight loss over 2 and half weeks.  Intervention: Educated patient on the importance of increasing calories and protein to minimize further weight loss. We discussed the importance of him continuing to swallow as tolerated. Encouraged a minimum of 4 Ensure Plus via feeding tube and recommended that we begin ordering Osmolite 1.5 usage. Patient would like to discuss this with with his wife and think about it until I meet with him again next week. Encouraged patient to flush his feeding tube with 60 mL of free water before and after each can of Ensure Plus.  Monitoring, evaluation, goals: Patient will increase oral intake plus tube feeding to minimize further weight loss.  Next visit: Tuesday, August 28, during  and neck clinic.  **Disclaimer: This note was dictated with voice recognition software. Similar sounding words can inadvertently be transcribed and this note may  contain transcription errors which may not have been corrected upon publication of note.**

## 2016-10-01 NOTE — Progress Notes (Signed)
Received PA determinations via phone and fax from [facility]:  Fentanyl patch approved 09/30/16-09/30/17  Ondansetron approved 09/30/16-09/30/17  Morphine Sulfate approved 09/30/16-09/30/17  Called [facility]) to advise and they went through.

## 2016-10-01 NOTE — Patient Instructions (Signed)
[facility] Discharge Instructions for Patients Receiving Chemotherapy  Today you received the following chemotherapy agents Taxol and Carboplatin  To help prevent nausea and vomiting after your treatment, we encourage you to take your nausea medication as prescribed by MD. **NO ZOFRAN FOR 3 DAYS AFTER TREATMENT. MAY TAKE ZOFRAN ON Monday**   If you develop nausea and vomiting that is not controlled by your nausea medication, call the clinic.   BELOW ARE SYMPTOMS THAT SHOULD BE REPORTED IMMEDIATELY:  *FEVER GREATER THAN 100.5 F  *CHILLS WITH OR WITHOUT FEVER  NAUSEA AND VOMITING THAT IS NOT CONTROLLED WITH YOUR NAUSEA MEDICATION  *UNUSUAL SHORTNESS OF BREATH  *UNUSUAL BRUISING OR BLEEDING  TENDERNESS IN MOUTH AND THROAT WITH OR WITHOUT PRESENCE OF ULCERS  *URINARY PROBLEMS  *BOWEL PROBLEMS  UNUSUAL RASH Items with * indicate a potential emergency and should be followed up as soon as possible.  Feel free to call the clinic you have any questions or concerns. The clinic phone number is [phone].  Please show the [facility] at check-in to the Emergency Department and triage nurse.   Carboplatin injection What is this medicine? CARBOPLATIN (KAR boe pla tin) is a chemotherapy drug. It targets fast dividing cells, like cancer cells, and causes these cells to die. This medicine is used to treat ovarian cancer and many other cancers. This medicine may be used for other purposes; ask your health care provider or pharmacist if you have questions. COMMON BRAND NAME(S): Paraplatin What should I tell my health care provider before I take this medicine? They need to know if you have any of these conditions: -blood disorders -hearing problems -kidney disease -recent or ongoing radiation therapy -an unusual or allergic reaction to carboplatin, cisplatin, other chemotherapy, other medicines, foods, dyes, or preservatives -pregnant or trying to get  pregnant -breast-feeding How should I use this medicine? This drug is usually given as an infusion into a vein. It is administered in a hospital or clinic by a specially trained health care professional. Talk to your pediatrician regarding the use of this medicine in children. Special care may be needed. Overdosage: If you think you have taken too much of this medicine contact a poison control center or emergency room at once. NOTE: This medicine is only for you. Do not share this medicine with others. What if I miss a dose? It is important not to miss a dose. Call your doctor or health care professional if you are unable to keep an appointment. What may interact with this medicine? -medicines for seizures -medicines to increase blood counts like filgrastim, pegfilgrastim, sargramostim -some antibiotics like amikacin, gentamicin, neomycin, streptomycin, tobramycin -vaccines Talk to your doctor or health care professional before taking any of these medicines: -acetaminophen -aspirin -ibuprofen -ketoprofen -naproxen This list may not describe all possible interactions. Give your health care provider a list of all the medicines, herbs, non-prescription drugs, or dietary supplements you use. Also tell them if you smoke, drink alcohol, or use illegal drugs. Some items may interact with your medicine. What should I watch for while using this medicine? Your condition will be monitored carefully while you are receiving this medicine. You will need important blood work done while you are taking this medicine. This drug may make you feel generally unwell. This is not uncommon, as chemotherapy can affect healthy cells as well as cancer cells. Report any side effects. Continue your course of treatment even though you feel ill unless your doctor tells you to stop.  In some cases, you may be given additional medicines to help with side effects. Follow all directions for their use. Call your doctor or  health care professional for advice if you get a fever, chills or sore throat, or other symptoms of a cold or flu. Do not treat yourself. This drug decreases your body's ability to fight infections. Try to avoid being around people who are sick. This medicine may increase your risk to bruise or bleed. Call your doctor or health care professional if you notice any unusual bleeding. Be careful brushing and flossing your teeth or using a toothpick because you may get an infection or bleed more easily. If you have any dental work done, tell your dentist you are receiving this medicine. Avoid taking products that contain aspirin, acetaminophen, ibuprofen, naproxen, or ketoprofen unless instructed by your doctor. These medicines may hide a fever. Do not become pregnant while taking this medicine. Women should inform their doctor if they wish to become pregnant or think they might be pregnant. There is a potential for serious side effects to an unborn child. Talk to your health care professional or pharmacist for more information. Do not breast-feed an infant while taking this medicine. What side effects may I notice from receiving this medicine? Side effects that you should report to your doctor or health care professional as soon as possible: -allergic reactions like skin rash, itching or hives, swelling of the face, lips, or tongue -signs of infection - fever or chills, cough, sore throat, pain or difficulty passing urine -signs of decreased platelets or bleeding - bruising, pinpoint red spots on the skin, black, tarry stools, nosebleeds -signs of decreased red blood cells - unusually weak or tired, fainting spells, lightheadedness -breathing problems -changes in hearing -changes in vision -chest pain -high blood pressure -low blood counts - This drug may decrease the number of white blood cells, red blood cells and platelets. You may be at increased risk for infections and bleeding. -nausea and  vomiting -pain, swelling, redness or irritation at the injection site -pain, tingling, numbness in the hands or feet -problems with balance, talking, walking -trouble passing urine or change in the amount of urine Side effects that usually do not require medical attention (report to your doctor or health care professional if they continue or are bothersome): -hair loss -loss of appetite -metallic taste in the mouth or changes in taste This list may not describe all possible side effects. Call your doctor for medical advice about side effects. You may report side effects to FDA at 1-800-FDA-1088. Where should I keep my medicine? This drug is given in a hospital or clinic and will not be stored at home. NOTE: This sheet is a summary. It may not cover all possible information. If you have questions about this medicine, talk to your doctor, pharmacist, or health care provider.  2018 Elsevier/Gold Standard (2007-05-02 14:38:05)   Paclitaxel injection What is this medicine? PACLITAXEL (PAK li TAX el) is a chemotherapy drug. It targets fast dividing cells, like cancer cells, and causes these cells to die. This medicine is used to treat ovarian cancer, breast cancer, and other cancers. This medicine may be used for other purposes; ask your health care provider or pharmacist if you have questions. COMMON BRAND NAME(S): Onxol, Taxol What should I tell my health care provider before I take this medicine? They need to know if you have any of these conditions: -blood disorders -irregular heartbeat -infection (especially a virus infection such as chickenpox, cold  sores, or herpes) -liver disease -previous or ongoing radiation therapy -an unusual or allergic reaction to paclitaxel, alcohol, polyoxyethylated castor oil, other chemotherapy agents, other medicines, foods, dyes, or preservatives -pregnant or trying to get pregnant -breast-feeding How should I use this medicine? This drug is given as an  infusion into a vein. It is administered in a hospital or clinic by a specially trained health care professional. Talk to your pediatrician regarding the use of this medicine in children. Special care may be needed. Overdosage: If you think you have taken too much of this medicine contact a poison control center or emergency room at once. NOTE: This medicine is only for you. Do not share this medicine with others. What if I miss a dose? It is important not to miss your dose. Call your doctor or health care professional if you are unable to keep an appointment. What may interact with this medicine? Do not take this medicine with any of the following medications: -disulfiram -metronidazole This medicine may also interact with the following medications: -cyclosporine -diazepam -ketoconazole -medicines to increase blood counts like filgrastim, pegfilgrastim, sargramostim -other chemotherapy drugs like cisplatin, doxorubicin, epirubicin, etoposide, teniposide, vincristine -quinidine -testosterone -vaccines -verapamil Talk to your doctor or health care professional before taking any of these medicines: -acetaminophen -aspirin -ibuprofen -ketoprofen -naproxen This list may not describe all possible interactions. Give your health care provider a list of all the medicines, herbs, non-prescription drugs, or dietary supplements you use. Also tell them if you smoke, drink alcohol, or use illegal drugs. Some items may interact with your medicine. What should I watch for while using this medicine? Your condition will be monitored carefully while you are receiving this medicine. You will need important blood work done while you are taking this medicine. This medicine can cause serious allergic reactions. To reduce your risk you will need to take other medicine(s) before treatment with this medicine. If you experience allergic reactions like skin rash, itching or hives, swelling of the face, lips, or  tongue, tell your doctor or health care professional right away. In some cases, you may be given additional medicines to help with side effects. Follow all directions for their use. This drug may make you feel generally unwell. This is not uncommon, as chemotherapy can affect healthy cells as well as cancer cells. Report any side effects. Continue your course of treatment even though you feel ill unless your doctor tells you to stop. Call your doctor or health care professional for advice if you get a fever, chills or sore throat, or other symptoms of a cold or flu. Do not treat yourself. This drug decreases your body's ability to fight infections. Try to avoid being around people who are sick. This medicine may increase your risk to bruise or bleed. Call your doctor or health care professional if you notice any unusual bleeding. Be careful brushing and flossing your teeth or using a toothpick because you may get an infection or bleed more easily. If you have any dental work done, tell your dentist you are receiving this medicine. Avoid taking products that contain aspirin, acetaminophen, ibuprofen, naproxen, or ketoprofen unless instructed by your doctor. These medicines may hide a fever. Do not become pregnant while taking this medicine. Women should inform their doctor if they wish to become pregnant or think they might be pregnant. There is a potential for serious side effects to an unborn child. Talk to your health care professional or pharmacist for more information. Do not breast-feed an  infant while taking this medicine. Men are advised not to father a child while receiving this medicine. This product may contain alcohol. Ask your pharmacist or healthcare provider if this medicine contains alcohol. Be sure to tell all healthcare providers you are taking this medicine. Certain medicines, like metronidazole and disulfiram, can cause an unpleasant reaction when taken with alcohol. The reaction includes  flushing, headache, nausea, vomiting, sweating, and increased thirst. The reaction can last from 30 minutes to several hours. What side effects may I notice from receiving this medicine? Side effects that you should report to your doctor or health care professional as soon as possible: -allergic reactions like skin rash, itching or hives, swelling of the face, lips, or tongue -low blood counts - This drug may decrease the number of white blood cells, red blood cells and platelets. You may be at increased risk for infections and bleeding. -signs of infection - fever or chills, cough, sore throat, pain or difficulty passing urine -signs of decreased platelets or bleeding - bruising, pinpoint red spots on the skin, black, tarry stools, nosebleeds -signs of decreased red blood cells - unusually weak or tired, fainting spells, lightheadedness -breathing problems -chest pain -high or low blood pressure -mouth sores -nausea and vomiting -pain, swelling, redness or irritation at the injection site -pain, tingling, numbness in the hands or feet -slow or irregular heartbeat -swelling of the ankle, feet, hands Side effects that usually do not require medical attention (report to your doctor or health care professional if they continue or are bothersome): -bone pain -complete hair loss including hair on your head, underarms, pubic hair, eyebrows, and eyelashes -changes in the color of fingernails -diarrhea -loosening of the fingernails -loss of appetite -muscle or joint pain -red flush to skin -sweating This list may not describe all possible side effects. Call your doctor for medical advice about side effects. You may report side effects to FDA at 1-800-FDA-1088. Where should I keep my medicine? This drug is given in a hospital or clinic and will not be stored at home. NOTE: This sheet is a summary. It may not cover all possible information. If you have questions about this medicine, talk to your  doctor, pharmacist, or health care provider.  2018 Elsevier/Gold Standard (2014-11-26 19:58:00)

## 2016-10-01 NOTE — Telephone Encounter (Addendum)
[facility] refill. Has 6 tablets  left and pt likes to take these when he goes out so he doesn't have to give liquid morphine through his feeding tube.  He has plenty of Roxanol.  HE is here in infusion for 1st taxol/carbo  for tongue ca. Will ask on call provider .

## 2016-10-04 ENCOUNTER — Encounter: Payer: Self-pay | Admitting: *Deleted

## 2016-10-04 ENCOUNTER — Inpatient Hospital Stay: Admission: RE | Admit: 2016-10-04 | Payer: Self-pay | Source: Ambulatory Visit

## 2016-10-04 ENCOUNTER — Ambulatory Visit
Admission: RE | Admit: 2016-10-04 | Discharge: 2016-10-04 | Disposition: A | Discharge status: Home / Self Care | Payer: Medicare Other | Source: Ambulatory Visit | Attending: Radiation Oncology | Admitting: Radiation Oncology

## 2016-10-04 DIAGNOSIS — Z510 Encounter for antineoplastic radiation therapy: Secondary | ICD-10-CM | POA: Diagnosis not present

## 2016-10-04 NOTE — Progress Notes (Signed)
Oncology Nurse Navigator Documentation  Met with Joel Walton prior to [facility] tmt, reminded him of tomorrow's H&N [facility] with 0800 arrival to Radiation Waiting following lobby registration Desk 1.  He voiced understanding.  Gayleen Orem, RN, BSN, [place] [facility] Nurse [facility] [phone]

## 2016-10-05 ENCOUNTER — Ambulatory Visit: Payer: Medicare Other

## 2016-10-05 ENCOUNTER — Telehealth: Payer: Self-pay

## 2016-10-05 ENCOUNTER — Encounter: Payer: Self-pay | Admitting: *Deleted

## 2016-10-05 ENCOUNTER — Ambulatory Visit: Payer: Medicare Other | Admitting: Physical Therapy

## 2016-10-05 ENCOUNTER — Ambulatory Visit
Admission: RE | Admit: 2016-10-05 | Discharge: 2016-10-05 | Disposition: A | Discharge status: Home / Self Care | Payer: Medicare Other | Source: Ambulatory Visit | Attending: Radiation Oncology | Admitting: Radiation Oncology

## 2016-10-05 ENCOUNTER — Ambulatory Visit: Payer: Medicare Other | Attending: Radiation Oncology

## 2016-10-05 ENCOUNTER — Ambulatory Visit: Payer: Medicare Other | Admitting: Nutrition

## 2016-10-05 VITALS — BP 130/95 | HR 100 | Temp 98.6°F | Ht 69.0 in | Wt 171.8 lb

## 2016-10-05 DIAGNOSIS — C01 Malignant neoplasm of base of tongue: Secondary | ICD-10-CM

## 2016-10-05 DIAGNOSIS — R1310 Dysphagia, unspecified: Secondary | ICD-10-CM | POA: Insufficient documentation

## 2016-10-05 DIAGNOSIS — R293 Abnormal posture: Secondary | ICD-10-CM | POA: Diagnosis present

## 2016-10-05 DIAGNOSIS — I890 Lymphedema, not elsewhere classified: Secondary | ICD-10-CM | POA: Diagnosis present

## 2016-10-05 DIAGNOSIS — Z510 Encounter for antineoplastic radiation therapy: Secondary | ICD-10-CM | POA: Diagnosis not present

## 2016-10-05 MED ORDER — OSMOLITE 1.5 CAL PO LIQD: ORAL | 0 refills | Status: AC

## 2016-10-05 NOTE — Therapy (Signed)
[place], [place], [address] Phone: [phone]   Fax:  [phone]  Physical Therapy Evaluation  Patient Details  Name: Joel Walton MRN: [id number] Date of Birth: [birth year] Referring Provider: Dr. Eppie Gibson  Encounter Date: 10/05/2016      PT End of Session - 10/05/16 1326    Visit Number 1   Number of Visits 1   PT Start Time 0835   PT Stop Time 0920   PT Time Calculation (min) 45 min   Activity Tolerance Patient tolerated treatment well   Behavior During Therapy [facility] for tasks assessed/performed      Past Medical History:  Diagnosis Date  . Hyperlipidemia   . Hypertension     Past Surgical History:  Procedure Laterality Date  . ELBOW SURGERY Right   . IR FLUORO GUIDE PORT INSERTION RIGHT  09/24/2016  . IR GASTROSTOMY TUBE MOD SED  09/24/2016  . IR US GUIDE VASC ACCESS RIGHT  09/24/2016  . KNEE SURGERY Bilateral    Not total knee replacements  . SHOULDER ARTHROSCOPY Right     There were no vitals filed for this visit.       Subjective Assessment - 10/05/16 1312    Subjective Having a lot of inflammation or swelling already.  Makes it hard to tolerate being in the mask for treatment. Feels like much would be better if swelling were lessened. Wife has MS and had a total knee replacement a couple ofmonths ago so uses a walker.   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  Former smoker. Edentulous, full dentures. Bilateral hearing loss. h/o right shoulder rotator cuff surgery and has pins in right elbow. Begain XRT 09/28/16 and is to have 35 fractions; chemo started 10/01/16. PEG/PAC placed 09/24/16.   Patient Stated Goals get info from all head & neck clinic providers   Currently in Pain? Yes   Pain Score --  pt. 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 neck adenopathy   Referring Provider Dr. Eppie Gibson   Onset Date/Surgical Date 09/28/16  started XRT   Hand Dominance Left   Prior Therapy none     Precautions   Precautions Other (comment)   Precaution Comments cancer precautions     Restrictions   Weight Bearing Restrictions No     Balance Screen   Has the patient fallen in the past 6 months No   Has the patient had a decrease in activity level because of a fear of falling?  No   Is the patient reluctant to leave their home because of a fear of falling?  No     Home Environment   Living Environment Private residence   Living Arrangements Spouse/significant other  wife has MS; adult daughter there too   Type of [place] One level     Prior Function   Level of Independence Independent   Vocation Retired   Leisure no regular  exercise, but does yardwork and housework     Cognition   Overall Cognitive Status Within Functional Limits for tasks assessed     Observation/Other Assessments   Observations neck appears swollen (see photo in the note), hands look arthritic, though patient is not aware of having had this diagnosis     Functional Tests   Functional tests Sit to Stand     Sit to Stand   Comments 10 times in 30 seconds, below average for age  mild SOB following; also leg fatighe     Posture/Postural Control   Posture/Postural Control Postural limitations   Postural Limitations Forward head  significant     ROM / Strength   AROM / PROM / Strength AROM     AROM   Overall AROM Comments neck sidebend approx. 25% loss bilat. and hurts going to right; rotation 25% loss to right  (painful) and 10% loss to left.  Shoulder AROM WFL.     Ambulation/Gait   Ambulation/Gait Yes   Ambulation/Gait Assistance 7: Independent   Stairs --  says he does okay on stairs   Gait Comments gait not viewed, but patient denied any problems           LYMPHEDEMA/ONCOLOGY QUESTIONNAIRE - 10/05/16 1323      Type   Cancer Type right base of tongue to left base of tongue ISCC, p16+ with bilat. neck adenopathy     Treatment   Active Chemotherapy Treatment Yes   Date 10/01/16   Active Radiation Treatment Yes   Date 09/28/16   Body Site neck     What other symptoms do you have   Are you having Pain Yes     Lymphedema Assessments   Lymphedema Assessments Head and Neck     Head and Neck   4 cm superior to sternal notch around neck 38.6 cm   6 cm superior to sternal notch around neck 39.6 cm   8 cm superior to sternal notch around neck 41.6 cm   Other 43.2 cm. at 10 cm. superior to sternal notch            Objective measurements completed on examination: See above findings.                  PT Education - 10/05/16 1326    Education provided Yes   Education Details neck ROM, posture, walking, CURE article on staying active, lymphedema and PT info; use of foam chip pack 2-4 hours a day at home for gentle neck compression   Person(s) Educated Patient;Child(ren)   Methods Explanation;Handout   Comprehension Verbalized understanding                 Head and Neck Clinic Goals - 10/05/16 1336      Patient will be able to verbalize understanding of a home exercise program for cervical range of motion, posture, and walking.    Status Achieved     Patient will be able to verbalize understanding of proper sitting and standing posture.    Status Achieved     Patient will be able to verbalize understanding of lymphedema risk and availability of treatment for this condition.    Status Achieved            Plan - 10/05/16 1327      Clinical Impression Statement This is a very pleasant gentleman, a little bit hard to understand and he reports that his throat gets more sore with more talking; he is  accompanied by his adult daughter today, who provides some information.  He is one week into chemoradiation treatment and he already appears to have swelling.  He also reports that he is swollen.  He was given a foam chip pack with instructions for its use today to have some compression on the swollen area of his neck.  I checked with Dr. 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   Clinical Presentation Evolving   Clinical Presentation due to: just started chemoradiation last week and will have it for some additional weeks   Clinical Decision Making Moderate   Rehab Potential Good   Clinical Impairments Affecting Rehab Potential currently in chemoradiation treatment until about the end of September   PT Frequency One time visit   PT Treatment/Interventions Patient/family education;ADLs/Self Care Home Management   PT Next Visit Plan None at this time, but will most likely need lymphedema treatment following his course of chemoradiation.   PT Home Exercise Plan neck ROM, good posture practice, walking   Consulted and Agree with Plan of Care Patient      Patient will benefit from skilled therapeutic intervention in order to improve the following deficits and impairments:  Increased edema, Decreased range of motion, Pain, Decreased knowledge of use of DME, Decreased knowledge of precautions  Visit Diagnosis: Lymphedema, not elsewhere classified - Plan: PT plan of care cert/re-cert  Abnormal posture - Plan: PT plan of care cert/re-cert      G-Codes - 79/48/01 1337    Functional Assessment Tool  Used (Outpatient Only) clincal judgement  re: neck swelling self-care   Functional Limitation Self care   Self Care Current Status (K5537) At least 80 percent but less than 100 percent impaired, limited or restricted   Self Care Goal Status (S8270) At least 1 percent but less than 20 percent impaired, limited or restricted   Self Care Discharge Status [phone]) At least 80 percent but less than 100 percent impaired, limited or restricted       Problem List Patient Active Problem List   Diagnosis Date Noted  . Thrush, oral 09/29/2016  . 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 Other constipation 09/29/2016  . Weight loss 09/29/2016  . Dysphagia 09/29/2016  . 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 Cancer of base of tongue ([place]) 09/14/2016    Chattie Greeson, PT 10/05/2016, 1:40 PM  [place], [place], [address] Phone: [phone]   Fax:  [phone]  Name: Joel Walton MRN: [id number] Date of Birth: 12/16/1945

## 2016-10-05 NOTE — Telephone Encounter (Signed)
Attempt to call pt home and cell for chemo f/u. Mobile number goes straight to VM.

## 2016-10-05 NOTE — Patient Instructions (Signed)
SWALLOWING EXERCISES Do these until 6 monhts after your last day of radiation, then 2-3 times per week afterwards  1. Effortful Swallows - Press your tongue against the roof of your mouth for 3 seconds, then squeeze          the muscles in your neck while you swallow your saliva or a sip of water - Repeat 20 times, 2-3 times a day, and use whenever you eat or drink  2. Masako Swallow - swallow with your tongue sticking out - Stick tongue out past your teeth and gently bite tongue with your teeth - Swallow, while holding your tongue with your teeth - Repeat 20 times, 2-3 times a day *use a wet spoon if your mouth gets dry*  3. Pitch Raise (optional) - Repeat "he", once per second in as high of a pitch as you can - Repeat 20 times, 2-3 times a day  4. Shaker Exercise - head lift - Lie flat on your back in your bed or on a couch without pillows - Raise your head and look at your feet - KEEP YOUR SHOULDERS DOWN - HOLD FOR 45-60 SECONDS, then lower your head back down - Repeat 3 times, 2-3 times a day  5. Mendelsohn Maneuver - "half swallow" exercise - Start to swallow, and keep your Adam's apple up by squeezing hard with the            muscles of the throat - Hold the squeeze for 5-7 seconds and then relax - Repeat 20 times, 2-3 times a day *use a wet spoon if your mouth gets dry*  6. Breath Hold - Say "HUH!" loudly, then hold your breath for 3 seconds at your voice box - Repeat 20 times, 2-3 times a day  7. Chin pushback - Open your mouth  - Place your fist UNDER your chin near your neck, and push back with your fist for 5 seconds - Repeat 10 times, 2-3 times a day

## 2016-10-05 NOTE — Progress Notes (Signed)
Oncology Nurse Navigator Documentation  Met with Joel Walton during [facility] [place].  He was accompanied by his daughter [name].  Provided verbal and written overview of [place], the clinicians who will be seeing him, encouraged him to ask questions during his time with them.  He was seen by Nutrition, SLP, PT, Man with him at end of [facility], addressed questions.  Provided dtr my contact information. They were encouraged to call with questions/concerns.  Gayleen Orem, RN, BSN, [place] [facility] [place] [phone]

## 2016-10-05 NOTE — Therapy (Signed)
[facility] [address] [place], [place], [address] Phone: [phone]   Fax:  [phone]  Speech Language Pathology Evaluation  Patient Details  Name: Joel Walton MRN: [id number] Date of Birth: 03-19-1945 Referring Provider: Eppie Gibson, MD  Encounter Date: 10/05/2016      End of Session - 10/05/16 1044    Visit Number 1   Number of Visits 3   Date for SLP Re-Evaluation 12/13/16   Authorization Type Blue MCR - requested today   SLP Start Time 0915   SLP Stop Time  1005   SLP Time Calculation (min) 50 min   Activity Tolerance Patient tolerated treatment well      Past Medical History:  Diagnosis Date  . Hyperlipidemia   . Hypertension     Past Surgical History:  Procedure Laterality Date  . ELBOW SURGERY Right   . IR FLUORO GUIDE PORT INSERTION RIGHT  09/24/2016  . IR GASTROSTOMY TUBE MOD SED  09/24/2016  . IR US GUIDE VASC ACCESS RIGHT  09/24/2016  . KNEE SURGERY Bilateral    Not total knee replacements  . SHOULDER ARTHROSCOPY Right     There were no vitals filed for this visit.      Subjective Assessment - 10/05/16 0932    Subjective Pt with solid food difficulties.   Patient is accompained by: Family member  wife   Currently in Pain? No/denies            SLP Evaluation [facility] - 10/05/16 0932      SLP Visit Information   SLP Received On 10/05/16   Referring Provider Eppie Gibson, MD   Onset Date july 2018   Medical Diagnosis Base of tongue cancer     General Information   HPI Pt is 71y.o. rt BOT tumor extending to lt BOT, former smoker, drinker. Began rad tx 09-28-16, chemo began 10-01-16, PEG placed 09-24-16.      Prior Functional Status   Cognitive/Linguistic Baseline Within functional limits     Cognition   Overall Cognitive Status Within Functional Limits for tasks assessed     Auditory Comprehension   Overall Auditory Comprehension Appears within functional limits for  tasks assessed     Verbal Expression   Overall Verbal Expression Appears within functional limits for tasks assessed     Oral Motor/Sensory Function   Overall Oral Motor/Sensory Function Appears within functional limits for tasks assessed     Motor Speech   Overall Motor Speech Appears within functional limits for tasks assessed      Pt currently tolerates dys I-II diet with thin liquids, and reports soft/dys III and regular textures are difficult for pharyngeal clearance - SLP suspects due to placement of base of tongue tumor. Pt without overt s/s aspiration PNA at this time. Pt has consult with dietician this morning as well. As he complains of ageusia and xerostomia.  POs: Pt with upper and lower dentures. Pt ate applesauce and drank water without overt s/s aspiration. Thyroid elevation appeared WNL, and swallows appeared timely. Oral residue noted as minimal/WNL. Pt's swallow deemed WNL at this time for consistencies assessed.   Because data states the risk for dysphagia during and after radiation treatment is high due to undergoing radiation tx, SLP taught pt about the possibility of reduced/limited ability for PO intake during rad tx. SLP encouraged pt to continue swallowing POs as far into rad tx as possible, even ingesting POs and/or completing HEP shortly after administration of pain meds.  SLP educated pt re: changes to swallowing musculature after rad tx, and why adherence to dysphagia HEP provided today and PO consumption was necessary to inhibit muscular disuse atrophy and to reduce muscle fibrosis following rad tx. Pt demonstrated understanding of these things to SLP.    SLP then developed a HEP for pt and pt was instructed how to perform exercises involving lingual, vocal, and pharyngeal strengthening. SLP performed each exercise and pt return demonstrated each exercise. SLP ensured pt performance was correct prior to moving on to next exercise. Pt was instructed to complete this  program 2 times a day until 6 months after his or her last rad tx, then x2 a week after that.                     SLP Education - 2016-10-09 1040    Education provided Yes   Education Details HEP, effects of head/neck radiation on swallowing   Person(s) Educated Patient;Spouse   Methods Explanation;Demonstration;Verbal cues;Handout   Comprehension Verbalized understanding          SLP Short Term Goals - 10-09-16 1048      SLP SHORT TERM GOAL #1   Title pt will complete HEP with rare min A   Time 1   Period --  visits   Status New     SLP SHORT TERM GOAL #2   Title pt will tell SLP why he is completing HEP    Time 1   Period --  visits   Status New          SLP Long Term Goals - 2016-10-09 1049      SLP LONG TERM GOAL #1   Title pt will tell SLP 3 overt s/s aspiration PNA with modified independence   Time 2   Period --  visits   Status New     SLP LONG TERM GOAL #2   Title pt will complete HEP with modified independence over two sessions    Time 2   Period --  vistis   Status New     SLP LONG TERM GOAL #3   Title pt will tell SLP why a food journal is helpful in returning to most liberal diet   Time 2   Period --  visits   Status New          Plan - 2016/10/09 1045    Clinical Impression Statement Pt with oropharyngeal swallowing essentially WNL with liquids and dys I items, pt reports difficulty with dys III-regular food items. The probability of further swallowing difficulty increases dramatically with the initiation of chemo and radiation therapy. Pt will need to be followed by SLP for regular assessment of accurate HEP completion as well as for safety with POs both during and following treatment/s.   Speech Therapy Frequency --  once approx every four weeks   Duration --  two visits/6-7 total visits   Treatment/Interventions Aspiration precaution training;Pharyngeal strengthening exercises;Diet toleration management by SLP;Compensatory  techniques;Internal/external aids;SLP instruction and feedback;Patient/family education;Oral motor exercises;Trials of upgraded texture/liquids;Environmental controls  any or any combination may be used   Potential to Achieve Goals Good   SLP Home Exercise Plan provided today   Consulted and Agree with Plan of Care Patient      Patient will benefit from skilled therapeutic intervention in order to improve the following deficits and impairments:   Dysphagia, unspecified type      G-Codes - Oct 09, 2016 1050    Functional Assessment Tool  Used noms, clinical judgement   Functional Limitations Swallowing   Swallow Current Status [phone]) At least 1 percent but less than 20 percent impaired, limited or restricted   Swallow Goal Status [phone]) At least 1 percent but less than 20 percent impaired, limited or restricted      Problem List Patient Active Problem List   Diagnosis Date Noted  . Thrush, oral 09/29/2016  . 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 Other constipation 09/29/2016  . Weight loss 09/29/2016  . Dysphagia 09/29/2016  . 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 Cancer of base of tongue ([place]) 09/14/2016    [facility] 10/05/2016, 11:01 AM  [facility] [address] [place], [place], [address] Phone: [phone]   Fax:  [phone]  Name: Joel Walton MRN: [id number] Date of Birth: Mar 26, 1945

## 2016-10-05 NOTE — Progress Notes (Signed)
Nutrition follow-up completed with patient and his daughter, during had Head and Neck clinic. Patient is scheduled for 35 radiation therapy treatments which started on August 21 for tongue cancer. Weight improved 171.8 pounds on the 28th from 170.3 pounds on the 22nd. Daughter reports she has been giving Ensure Plus one bottle via PEG 4 times a day in addition to patient eating small amounts of food Patient states he can drink water without problem but soft foods are becoming more difficult. He agrees to begin using his feeding tube for nutrition support.  Estimated nutrition needs: 2200-2400 calories, 90-110 grams protein, 2.4 L fluid.  Nutrition diagnosis: Inadequate oral intake continues.  Intervention: Educated patient and daughter on administering Osmolite 1.5, 1-1/2 bottles 4 times a day with 60 mL free water before and after bolus feeding via PEG.  Patient will continue to drink water and understands goal rate is a minimum of 24 ounces by mouth. Patient will continue to eat soft foods as tolerated. When patient no longer eating,educated to increase 1 feeding to 2 bottles daily to provide total of 6.5 bottles of Osmolite 1.5 daily. This provides 2307 cal, 97 g protein, 2377 mL free water. Reviewed bolus tube feeding with patient's daughter and provided written information. Questions were answered.  Teach back method used. [facility] was notified.  Monitoring, evaluation, goals: Patient will tolerate tube feeding via PEG and oral intake to meet greater than 90% of estimated nutrition needs to promote weight maintenance.  Next visit: Friday, August 31 during infusion.  **Disclaimer: This note was dictated with voice recognition software. Similar sounding words can inadvertently be transcribed and this note may contain transcription errors which may not have been corrected upon publication of note.**

## 2016-10-06 ENCOUNTER — Other Ambulatory Visit: Payer: Self-pay

## 2016-10-06 ENCOUNTER — Telehealth: Payer: Self-pay

## 2016-10-06 ENCOUNTER — Other Ambulatory Visit: Payer: Self-pay | Admitting: Radiation Oncology

## 2016-10-06 ENCOUNTER — Encounter: Payer: Self-pay | Admitting: Radiation Oncology

## 2016-10-06 ENCOUNTER — Ambulatory Visit
Admission: RE | Admit: 2016-10-06 | Discharge: 2016-10-06 | Disposition: A | Discharge status: Home / Self Care | Payer: Medicare Other | Source: Ambulatory Visit | Attending: Radiation Oncology | Admitting: Radiation Oncology

## 2016-10-06 ENCOUNTER — Other Ambulatory Visit (HOSPITAL_BASED_OUTPATIENT_CLINIC_OR_DEPARTMENT_OTHER): Payer: Medicare Other

## 2016-10-06 DIAGNOSIS — C01 Malignant neoplasm of base of tongue: Secondary | ICD-10-CM

## 2016-10-06 DIAGNOSIS — C770 Secondary and unspecified malignant neoplasm of lymph nodes of head, face and neck: Secondary | ICD-10-CM

## 2016-10-06 DIAGNOSIS — R634 Abnormal weight loss: Secondary | ICD-10-CM

## 2016-10-06 DIAGNOSIS — Z510 Encounter for antineoplastic radiation therapy: Secondary | ICD-10-CM | POA: Diagnosis not present

## 2016-10-06 LAB — CBC WITH DIFFERENTIAL/PLATELET
BASO%: 0.2 % (ref 0.0–2.0)
Basophils Absolute: 0 10*3/uL (ref 0.0–0.1)
EOS ABS: 0 10*3/uL (ref 0.0–0.5)
EOS%: 0.2 % (ref 0.0–7.0)
HCT: 38.3 % — ABNORMAL LOW (ref 38.4–49.9)
HEMOGLOBIN: 12.9 g/dL — AB (ref 13.0–17.1)
LYMPH%: 2.9 % — AB (ref 14.0–49.0)
MCH: 31.1 pg (ref 27.2–33.4)
MCHC: 33.7 g/dL (ref 32.0–36.0)
MCV: 92.3 fL (ref 79.3–98.0)
MONO#: 0.1 10*3/uL (ref 0.1–0.9)
MONO%: 2.2 % (ref 0.0–14.0)
NEUT%: 94.5 % — ABNORMAL HIGH (ref 39.0–75.0)
NEUTROS ABS: 5.5 10*3/uL (ref 1.5–6.5)
PLATELETS: 199 10*3/uL (ref 140–400)
RBC: 4.15 10*6/uL — AB (ref 4.20–5.82)
RDW: 12.1 % (ref 11.0–14.6)
WBC: 5.8 10*3/uL (ref 4.0–10.3)
lymph#: 0.2 10*3/uL — ABNORMAL LOW (ref 0.9–3.3)

## 2016-10-06 LAB — TSH: TSH: 0.151 m[IU]/L — AB (ref 0.320–4.118)

## 2016-10-06 MED ORDER — LORAZEPAM 0.5 MG PO TABS: ORAL_TABLET | ORAL | 0 refills | Status: DC

## 2016-10-06 MED ORDER — LORAZEPAM 0.5 MG PO TABS
0.5000 mg | ORAL_TABLET | ORAL | Status: DC
  Filled 2016-10-06: qty 1

## 2016-10-06 MED ORDER — LORAZEPAM 0.5 MG PO TABS
0.5000 mg | ORAL_TABLET | Freq: Once | ORAL | Status: AC
  Administered 2016-10-06: 0.5 mg via ORAL
  Filled 2016-10-06: qty 1

## 2016-10-06 NOTE — Progress Notes (Signed)
Financial Counseling--spoke with patient regarding grant--said he would bring in his income verification to see if he qualifies

## 2016-10-06 NOTE — Telephone Encounter (Signed)
Met with pt in lobby prior to radiation appt to complete chemo f/u. Pt has no c/o anything related to chemotherapy. Slept through infusion and has not had any side effects at home. Did have complaint regarding claustrophobic feeling r/t radiation with the mask in place. Increasingly anxious prior to and during radiation. Communicated with Dr. Isidore Moos. Will order medication for anxiety so that pt can take prior to radiation today and in the future.

## 2016-10-07 ENCOUNTER — Ambulatory Visit (HOSPITAL_BASED_OUTPATIENT_CLINIC_OR_DEPARTMENT_OTHER): Payer: Medicare Other | Admitting: Hematology and Oncology

## 2016-10-07 ENCOUNTER — Other Ambulatory Visit: Payer: Self-pay | Admitting: Hematology and Oncology

## 2016-10-07 ENCOUNTER — Encounter: Payer: Self-pay | Admitting: *Deleted

## 2016-10-07 ENCOUNTER — Encounter: Payer: Self-pay | Admitting: Hematology and Oncology

## 2016-10-07 ENCOUNTER — Ambulatory Visit
Admission: RE | Admit: 2016-10-07 | Discharge: 2016-10-07 | Disposition: A | Discharge status: Home / Self Care | Payer: Medicare Other | Source: Ambulatory Visit | Attending: Radiation Oncology | Admitting: Radiation Oncology

## 2016-10-07 VITALS — BP 131/79 | HR 99 | Temp 99.2°F | Resp 18 | Ht 69.0 in | Wt 169.1 lb

## 2016-10-07 DIAGNOSIS — R1310 Dysphagia, unspecified: Secondary | ICD-10-CM

## 2016-10-07 DIAGNOSIS — C770 Secondary and unspecified malignant neoplasm of lymph nodes of head, face and neck: Secondary | ICD-10-CM

## 2016-10-07 DIAGNOSIS — B370 Candidal stomatitis: Secondary | ICD-10-CM | POA: Diagnosis not present

## 2016-10-07 DIAGNOSIS — G893 Neoplasm related pain (acute) (chronic): Secondary | ICD-10-CM

## 2016-10-07 DIAGNOSIS — K5909 Other constipation: Secondary | ICD-10-CM | POA: Diagnosis not present

## 2016-10-07 DIAGNOSIS — Z510 Encounter for antineoplastic radiation therapy: Secondary | ICD-10-CM | POA: Diagnosis not present

## 2016-10-07 DIAGNOSIS — C01 Malignant neoplasm of base of tongue: Secondary | ICD-10-CM

## 2016-10-07 DIAGNOSIS — R748 Abnormal levels of other serum enzymes: Secondary | ICD-10-CM

## 2016-10-07 DIAGNOSIS — E441 Mild protein-calorie malnutrition: Secondary | ICD-10-CM | POA: Diagnosis not present

## 2016-10-07 LAB — COMPREHENSIVE METABOLIC PANEL
ALBUMIN: 3.2 g/dL — AB (ref 3.5–5.0)
ALT: 101 U/L — AB (ref 0–55)
AST: 38 U/L — AB (ref 5–34)
Alkaline Phosphatase: 111 U/L (ref 40–150)
Anion Gap: 10 mEq/L (ref 3–11)
BUN: 10.9 mg/dL (ref 7.0–26.0)
CALCIUM: 9.9 mg/dL (ref 8.4–10.4)
CHLORIDE: 94 meq/L — AB (ref 98–109)
CO2: 30 mEq/L — ABNORMAL HIGH (ref 22–29)
CREATININE: 0.7 mg/dL (ref 0.7–1.3)
EGFR: >90 mL/min/{1.73_m2} (ref >90)
GLUCOSE: 216 mg/dL — AB (ref 70–140)
POTASSIUM: 4.6 meq/L (ref 3.5–5.1)
SODIUM: 134 meq/L — AB (ref 136–145)
Total Bilirubin: 0.52 mg/dL (ref 0.20–1.20)
Total Protein: 6.9 g/dL (ref 6.4–8.3)

## 2016-10-07 NOTE — Assessment & Plan Note (Signed)
The lymph node is still tender on palpation Continue close observation

## 2016-10-07 NOTE — Assessment & Plan Note (Signed)
This is due to recent chemotherapy I have verified with pharmacist that we do not need dose adjustment to chemotherapy next week

## 2016-10-07 NOTE — Assessment & Plan Note (Signed)
Weight loss has stabilized since he started nutritional supplement He will continue close follow-up with dietitian

## 2016-10-07 NOTE — Assessment & Plan Note (Signed)
He had recent constipation due to narcotic prescription, resolved Reinforced the importance of taking laxatives on a regular basis

## 2016-10-07 NOTE — Progress Notes (Signed)
[facility] OFFICE PROGRESS NOTE  Patient Care Team: Lorene Dy, MD as PCP - General (Internal Medicine) Ardath Sax, MD as Consulting Physician (Hematology and Oncology) Eppie Gibson, MD as Attending Physician (Radiation Oncology) Leota Sauers, RN as Oncology Nurse Navigator (Oncology)  SUMMARY OF ONCOLOGIC HISTORY:   Cancer of base of tongue ([place])   08/26/2016 Pathology Results    Tongue biopsy confirmed squamous cell carcinoma      08/27/2016 Initial Diagnosis    Cancer of base of tongue [facility]): positive for p16      09/01/2016 Imaging    CT Neck: Large necrotic an ulcerating lesion at the right base of the tongue measuring 3.9 cm. Pathological lymphadenopathy in the right neck noted.      09/07/2016 Imaging    PET-CT: Large hypermetabolic mass at the right base of the tongue with extension across the midline. Significant hypermetabolic level II right-sided lymphadenopathy, possible pathological lymphadenopathy on the left side at level II.      09/24/2016 Procedure    Ultrasound and fluoroscopically guided right internal jugular single lumen power port catheter insertion. Tip in the SVC/RA junction. Catheter ready for use      09/24/2016 Procedure    Fluoroscopic insertion of a 20-French "pull-through" gastrostomy.       INTERVAL HISTORY: Please see below for problem oriented charting. He was seen as part of his weekly supportive care visit Since the last time I saw him, he was able to get pain better control Constipation had resolved Due to insurance issue, he was not able to get fentanyl patch started He has started nutritional supplement through his feeding tube He saw speech and language therapist recently for speech therapy and evaluation of dysphagia He has more difficulty swallowing pills He denies excessive sedation from treatment He was started on lorazepam due to claustrophobia He denies nausea or vomiting Denies peripheral  neuropathy. His weight has stabilized  REVIEW OF SYSTEMS:   Constitutional: Denies fevers, chills or abnormal weight loss Eyes: Denies blurriness of vision Respiratory: Denies cough, dyspnea or wheezes Cardiovascular: Denies palpitation, chest discomfort or lower extremity swelling Gastrointestinal:  Denies nausea, heartburn or change in bowel habits Skin: Denies abnormal skin rashes Lymphatics: Denies new lymphadenopathy or easy bruising Neurological:Denies numbness, tingling or new weaknesses Behavioral/Psych: Mood is stable, no new changes  All other systems were reviewed with the patient and are negative.  I have reviewed the past medical history, past surgical history, social history and family history with the patient and they are unchanged from previous note.  ALLERGIES:  has No Known Allergies.  MEDICATIONS:  Current Outpatient Prescriptions  Medication Sig Dispense Refill  . aspirin 81 MG chewable tablet Chew by mouth daily.    . fentaNYL (DURAGESIC - DOSED MCG/HR) 25 MCG/HR patch Place 1 patch (25 mcg total) onto the skin every 3 (three) days. 5 patch 0  . lidocaine (XYLOCAINE) 2 % solution Patient: Mix 1part 2% viscous lidocaine, 1part H20. Swish & swallow 66mL of diluted mixture, 60min before meals and at bedtime, up to QID 100 mL 5  . lidocaine-prilocaine (EMLA) cream Apply to affected area once 30 g 3  . LORazepam (ATIVAN) 0.5 MG tablet Take 1 tablet 20-30 min before radiotherapy for anxiety, prn. 30 tablet 0  . morphine (MSIR) 15 MG tablet Take 1 tablet (15 mg total) by mouth every 4 (four) hours as needed for severe pain. 30 tablet 0  . morphine (ROXANOL) 20 MG/ML concentrated solution Take 1  mL (20 mg total) by mouth every 2 (two) hours as needed for severe pain. 120 mL 0  . Multiple Vitamin (MULTIVITAMIN) tablet Take 1 tablet by mouth daily.    . Nutritional Supplements (FEEDING SUPPLEMENT, OSMOLITE 1.5 CAL,) LIQD Give 1.5 bottles Osmolite 1.5 QID with 60 mL free water  before and after via PEG. Increase to 2 bottles once daily and continue 1.5 bottles TID as tolerated. Drink or flush PEG with additional 240 mL free water TID. Send supplies and formula. Call daughter, [name], at [phone] to arrange delivery for patient. 1541 mL 0  . nystatin (MYCOSTATIN) 100000 UNIT/ML suspension Take 5 mLs (500,000 Units total) by mouth 4 (four) times daily. Swish and swallow if possible 473 mL 0  . omeprazole (PRILOSEC) 20 MG capsule   0  . ondansetron (ZOFRAN) 8 MG tablet Take 1 tablet (8 mg total) by mouth 2 (two) times daily as needed for refractory nausea / vomiting. Start on day 3 after chemo. 30 tablet 1  . predniSONE (DELTASONE) 20 MG tablet 3 tabs poqday 1-2, 2 tabs poqday 3-4, 1 tab poqday 5-6 12 tablet 0  . prochlorperazine (COMPAZINE) 10 MG tablet Take 1 tablet (10 mg total) by mouth every 6 (six) hours as needed (Nausea or vomiting). 30 tablet 1   No current facility-administered medications for this visit.     PHYSICAL EXAMINATION: ECOG PERFORMANCE STATUS: 1 - Symptomatic but completely ambulatory  Vitals:   10/07/16 1402  BP: 131/79  Pulse: 99  Resp: 18  Temp: 99.2 F (37.3 C)  SpO2: 99%   Filed Weights   10/07/16 1402  Weight: 169 lb 1.6 oz (76.7 kg)    GENERAL:alert, no distress and comfortable SKIN: skin color, texture, turgor are normal, no rashes or significant lesions EYES: normal, Conjunctiva are pink and non-injected, sclera clear OROPHARYNX:no exudate, no erythema and lips, buccal mucosa, and tongue normal.  Oral thrush has resolved NECK: supple, thyroid normal size, non-tender, without nodularity LYMPH:  he has persistent palpable lymphadenopathy on the right side of his neck, tender on palpation LUNGS: clear to auscultation and percussion with normal breathing effort HEART: regular rate & rhythm and no murmurs and no lower extremity edema ABDOMEN:abdomen soft, non-tender and normal bowel sounds.  Feeding tube site is checked, bandage  is replaced Musculoskeletal:no cyanosis of digits and no clubbing  NEURO: alert & oriented x 3 with fluent speech, no focal motor/sensory deficits  LABORATORY DATA:  I have reviewed the data as listed    Component Value Date/Time   NA 134 (L) 10/07/2016 1440   K 4.6 10/07/2016 1440   CL 97 (L) 09/24/2016 0738   CO2 30 (H) 10/07/2016 1440   GLUCOSE 216 (H) 10/07/2016 1440   BUN 10.9 10/07/2016 1440   CREATININE 0.7 10/07/2016 1440   CALCIUM 9.9 10/07/2016 1440   PROT 6.9 10/07/2016 1440   ALBUMIN 3.2 (L) 10/07/2016 1440   AST 38 (H) 10/07/2016 1440   ALT 101 (H) 10/07/2016 1440   ALKPHOS 111 10/07/2016 1440   BILITOT 0.52 10/07/2016 1440   GFRNONAA >60 09/24/2016 0738   GFRAA >60 09/24/2016 0738    No results found for: SPEP, UPEP  Lab Results  Component Value Date   WBC 5.8 10/06/2016   NEUTROABS 5.5 10/06/2016   HGB 12.9 (L) 10/06/2016   HCT 38.3 (L) 10/06/2016   MCV 92.3 10/06/2016   PLT 199 10/06/2016      Chemistry      Component Value Date/Time  NA 134 (L) 10/07/2016 1440   K 4.6 10/07/2016 1440   CL 97 (L) 09/24/2016 0738   CO2 30 (H) 10/07/2016 1440   BUN 10.9 10/07/2016 1440   CREATININE 0.7 10/07/2016 1440      Component Value Date/Time   CALCIUM 9.9 10/07/2016 1440   ALKPHOS 111 10/07/2016 1440   AST 38 (H) 10/07/2016 1440   ALT 101 (H) 10/07/2016 1440   BILITOT 0.52 10/07/2016 1440       RADIOGRAPHIC STUDIES: I have personally reviewed the radiological images as listed and agreed with the findings in the report. Ir Gastrostomy Tube  Result Date: 09/24/2016 INDICATION: Tonsillar carcinoma, dysphagia EXAM: FLUOROSCOPIC 20 FRENCH PULL-THROUGH GASTROSTOMY Date:  8/17/20188/17/2018 11:24 am Radiologist:  M. Daryll Brod, MD Guidance:  Fluoroscopic MEDICATIONS: Ancef 2 g; Antibiotics were administered within 1 hour of the procedure. Glucagon 0.5 mg IV ANESTHESIA/SEDATION: Versed 2.0 mg IV; Fentanyl 100 mcg IV Moderate Sedation Time:  18 The patient  was continuously monitored during the procedure by the interventional radiology nurse under my direct supervision. CONTRAST:  30mL ISOVUE-300 IOPAMIDOL (ISOVUE-300) INJECTION 61% - administered into the gastric lumen. FLUOROSCOPY TIME:  Fluoroscopy Time: 2 minutes 24 seconds (55 mGy). COMPLICATIONS: None immediate. PROCEDURE: Informed consent was obtained from the patient following explanation of the procedure, risks, benefits and alternatives. The patient understands, agrees and consents for the procedure. All questions were addressed. A time out was performed. Maximal barrier sterile technique utilized including caps, mask, sterile gowns, sterile gloves, large sterile drape, hand hygiene, and betadine prep. The left upper quadrant was sterilely prepped and draped. An oral gastric catheter was inserted into the stomach under fluoroscopy. The existing nasogastric feeding tube was removed. Air was injected into the stomach for insufflation and visualization under fluoroscopy. The air distended stomach was confirmed beneath the anterior abdominal wall in the frontal and lateral projections. Under sterile conditions and local anesthesia, a 15 gauge trocar needle was utilized to access the stomach percutaneously beneath the left subcostal margin. Needle position was confirmed within the stomach under biplane fluoroscopy. Contrast injection confirmed position also. A single T tack was deployed for gastropexy. Over an Amplatz guide wire, a 9-French sheath was inserted into the stomach. A snare device was utilized to capture the oral gastric catheter. The snare device was pulled retrograde from the stomach up the esophagus and out the oropharynx. The 20-French pull-through gastrostomy was connected to the snare device and pulled antegrade through the oropharynx down the esophagus into the stomach and then through the percutaneous tract external to the patient. The gastrostomy was assembled externally. Contrast injection  confirms position in the stomach. Images were obtained for documentation. The patient tolerated procedure well. No immediate complication. IMPRESSION: Fluoroscopic insertion of a 20-French "pull-through" gastrostomy. Electronically Signed   By: Jerilynn Mages.  Shick M.D.   On: 09/24/2016 11:31   Ir US Guide Vasc Access Right  Result Date: 09/24/2016 CLINICAL DATA:  Tonsillar carcinoma EXAM: RIGHT INTERNAL JUGULAR SINGLE LUMEN POWER PORT CATHETER INSERTION Date:  8/17/20188/17/2018 10:57 am Radiologist:  M. Daryll Brod, MD Guidance:  Ultrasound and fluoroscopic MEDICATIONS: Ancef 2 g; The antibiotic was administered within an appropriate time interval prior to skin puncture. ANESTHESIA/SEDATION: Versed 2.0 mg IV; Fentanyl 100 mcg IV; Moderate Sedation Time:  31 minutes The patient was continuously monitored during the procedure by the interventional radiology nurse under my direct supervision. FLUOROSCOPY TIME:  36 seconds (7 mGy) COMPLICATIONS: None immediate. CONTRAST:  None. PROCEDURE: Informed consent was obtained from the patient  following explanation of the procedure, risks, benefits and alternatives. The patient understands, agrees and consents for the procedure. All questions were addressed. A time out was performed. Maximal barrier sterile technique utilized including caps, mask, sterile gowns, sterile gloves, large sterile drape, hand hygiene, and 2% chlorhexidine scrub. Under sterile conditions and local anesthesia, right internal jugular micropuncture venous access was performed. Access was performed with ultrasound. Images were obtained for documentation. A guide wire was inserted followed by a transitional dilator. This allowed insertion of a guide wire and catheter into the IVC. Measurements were obtained from the SVC / RA junction back to the right IJ venotomy site. In the right infraclavicular chest, a subcutaneous pocket was created over the second anterior rib. This was done under sterile conditions and  local anesthesia. 1% lidocaine with epinephrine was utilized for this. A 2.5 cm incision was made in the skin. Blunt dissection was performed to create a subcutaneous pocket over the right pectoralis major muscle. The pocket was flushed with saline vigorously. There was adequate hemostasis. The port catheter was assembled and checked for leakage. The port catheter was secured in the pocket with two retention sutures. The tubing was tunneled subcutaneously to the right venotomy site and inserted into the SVC/RA junction through a valved peel-away sheath. Position was confirmed with fluoroscopy. Images were obtained for documentation. The patient tolerated the procedure well. No immediate complications. Incisions were closed in a two layer fashion with 4 - 0 Vicryl suture. Dermabond was applied to the skin. The port catheter was accessed, blood was aspirated followed by saline and heparin flushes. Needle was removed. A dry sterile dressing was applied. IMPRESSION: Ultrasound and fluoroscopically guided right internal jugular single lumen power port catheter insertion. Tip in the SVC/RA junction. Catheter ready for use. Electronically Signed   By: Jerilynn Mages.  Shick M.D.   On: 09/24/2016 11:32   Ir Fluoro Guide Port Insertion Right  Result Date: 09/24/2016 CLINICAL DATA:  Tonsillar carcinoma EXAM: RIGHT INTERNAL JUGULAR SINGLE LUMEN POWER PORT CATHETER INSERTION Date:  8/17/20188/17/2018 10:57 am Radiologist:  M. Daryll Brod, MD Guidance:  Ultrasound and fluoroscopic MEDICATIONS: Ancef 2 g; The antibiotic was administered within an appropriate time interval prior to skin puncture. ANESTHESIA/SEDATION: Versed 2.0 mg IV; Fentanyl 100 mcg IV; Moderate Sedation Time:  31 minutes The patient was continuously monitored during the procedure by the interventional radiology nurse under my direct supervision. FLUOROSCOPY TIME:  36 seconds (7 mGy) COMPLICATIONS: None immediate. CONTRAST:  None. PROCEDURE: Informed consent was  obtained from the patient following explanation of the procedure, risks, benefits and alternatives. The patient understands, agrees and consents for the procedure. All questions were addressed. A time out was performed. Maximal barrier sterile technique utilized including caps, mask, sterile gowns, sterile gloves, large sterile drape, hand hygiene, and 2% chlorhexidine scrub. Under sterile conditions and local anesthesia, right internal jugular micropuncture venous access was performed. Access was performed with ultrasound. Images were obtained for documentation. A guide wire was inserted followed by a transitional dilator. This allowed insertion of a guide wire and catheter into the IVC. Measurements were obtained from the SVC / RA junction back to the right IJ venotomy site. In the right infraclavicular chest, a subcutaneous pocket was created over the second anterior rib. This was done under sterile conditions and local anesthesia. 1% lidocaine with epinephrine was utilized for this. A 2.5 cm incision was made in the skin. Blunt dissection was performed to create a subcutaneous pocket over the right pectoralis major muscle. The  pocket was flushed with saline vigorously. There was adequate hemostasis. The port catheter was assembled and checked for leakage. The port catheter was secured in the pocket with two retention sutures. The tubing was tunneled subcutaneously to the right venotomy site and inserted into the SVC/RA junction through a valved peel-away sheath. Position was confirmed with fluoroscopy. Images were obtained for documentation. The patient tolerated the procedure well. No immediate complications. Incisions were closed in a two layer fashion with 4 - 0 Vicryl suture. Dermabond was applied to the skin. The port catheter was accessed, blood was aspirated followed by saline and heparin flushes. Needle was removed. A dry sterile dressing was applied. IMPRESSION: Ultrasound and fluoroscopically guided  right internal jugular single lumen power port catheter insertion. Tip in the SVC/RA junction. Catheter ready for use. Electronically Signed   By: Jerilynn Mages.  Shick M.D.   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[facility]) The lymph node is still tender on palpation Continue close observation  Dysphagia He has significant dysphagia due to pain and disease He has seen speech and language therapist recently We discussed modified diet and I will switch his pain medicine to liquid morphine sulfate and fentanyl patch as needed  Other constipation He had recent constipation due to narcotic prescription, resolved Reinforced the importance of taking laxatives on a regular basis  Thrush, oral This has improved on recent nystatin Observe  Mild protein malnutrition ([place]) Weight loss has stabilized since he started nutritional supplement He will continue close follow-up with dietitian  Elevated liver enzymes This is due to recent chemotherapy I have verified with pharmacist that we do not need dose adjustment to chemotherapy next week   Orders Placed This Encounter  Procedures  . Comprehensive metabolic panel    Standing Status:   Future    Number of Occurrences:   1    Standing Expiration Date:   11/11/2017   All questions were answered. The patient knows to call the clinic with any problems, questions or concerns. No barriers to  learning was detected. I spent 25 minutes counseling the patient face to face. The total time spent in the appointment was 40 minutes and more than 50% was on counseling and review of test results     Heath Lark, MD 10/07/2016 4:37 PM

## 2016-10-07 NOTE — Assessment & Plan Note (Signed)
His pain is better controlled with liquid morphine sulfate I have contacted [facility] and finally were able to get him started on fentanyl patch I warned about risk of constipation

## 2016-10-07 NOTE — Assessment & Plan Note (Signed)
He has significant dysphagia due to pain and disease He has seen speech and language therapist recently We discussed modified diet and I will switch his pain medicine to liquid morphine sulfate and fentanyl patch as needed

## 2016-10-07 NOTE — Assessment & Plan Note (Signed)
This has improved on recent nystatin Observe

## 2016-10-07 NOTE — Assessment & Plan Note (Addendum)
Overall, he tolerated treatment well with expected side effects of treatment We will continue to monitor closely We will proceed with treatment tomorrow without dose adjustment He was started on prednisone recently I recommend completion of prednisone therapy by the end of the week

## 2016-10-08 ENCOUNTER — Telehealth: Payer: Self-pay

## 2016-10-08 ENCOUNTER — Ambulatory Visit (HOSPITAL_BASED_OUTPATIENT_CLINIC_OR_DEPARTMENT_OTHER): Payer: Medicare Other

## 2016-10-08 ENCOUNTER — Ambulatory Visit: Payer: Self-pay

## 2016-10-08 ENCOUNTER — Ambulatory Visit
Admission: RE | Admit: 2016-10-08 | Discharge: 2016-10-08 | Disposition: A | Discharge status: Home / Self Care | Payer: Medicare Other | Source: Ambulatory Visit | Attending: Radiation Oncology | Admitting: Radiation Oncology

## 2016-10-08 ENCOUNTER — Ambulatory Visit: Payer: Medicare Other | Admitting: Nutrition

## 2016-10-08 ENCOUNTER — Encounter: Payer: Self-pay | Admitting: Radiation Oncology

## 2016-10-08 ENCOUNTER — Telehealth: Payer: Self-pay | Admitting: Hematology and Oncology

## 2016-10-08 DIAGNOSIS — C01 Malignant neoplasm of base of tongue: Secondary | ICD-10-CM | POA: Diagnosis not present

## 2016-10-08 DIAGNOSIS — Z5111 Encounter for antineoplastic chemotherapy: Secondary | ICD-10-CM

## 2016-10-08 DIAGNOSIS — C770 Secondary and unspecified malignant neoplasm of lymph nodes of head, face and neck: Secondary | ICD-10-CM

## 2016-10-08 DIAGNOSIS — Z510 Encounter for antineoplastic radiation therapy: Secondary | ICD-10-CM | POA: Diagnosis not present

## 2016-10-08 MED ORDER — SODIUM CHLORIDE 0.9% FLUSH
10.0000 mL | INTRAVENOUS | Status: DC | PRN
  Administered 2016-10-08: 10 mL
  Filled 2016-10-08: qty 10

## 2016-10-08 MED ORDER — HEPARIN SOD (PORK) LOCK FLUSH 100 UNIT/ML IV SOLN
500.0000 [IU] | Freq: Once | INTRAVENOUS | Status: AC | PRN
  Administered 2016-10-08: 500 [IU]
  Filled 2016-10-08: qty 5

## 2016-10-08 MED ORDER — SODIUM CHLORIDE 0.9 % IV SOLN
20.0000 mg | Freq: Once | INTRAVENOUS | Status: AC
  Administered 2016-10-08: 20 mg via INTRAVENOUS
  Filled 2016-10-08: qty 2

## 2016-10-08 MED ORDER — FAMOTIDINE IN NACL 20-0.9 MG/50ML-% IV SOLN
INTRAVENOUS | Status: AC
  Filled 2016-10-08: qty 50

## 2016-10-08 MED ORDER — SODIUM CHLORIDE 0.9 % IV SOLN
45.0000 mg/m2 | Freq: Once | INTRAVENOUS | Status: AC
  Administered 2016-10-08: 90 mg via INTRAVENOUS
  Filled 2016-10-08: qty 15

## 2016-10-08 MED ORDER — DIPHENHYDRAMINE HCL 50 MG/ML IJ SOLN
50.0000 mg | Freq: Once | INTRAMUSCULAR | Status: AC
  Administered 2016-10-08: 50 mg via INTRAVENOUS

## 2016-10-08 MED ORDER — SODIUM CHLORIDE 0.9 % IV SOLN
Freq: Once | INTRAVENOUS | Status: AC
  Administered 2016-10-08: 12:00:00 via INTRAVENOUS

## 2016-10-08 MED ORDER — PALONOSETRON HCL INJECTION 0.25 MG/5ML
0.2500 mg | Freq: Once | INTRAVENOUS | Status: AC
  Administered 2016-10-08: 0.25 mg via INTRAVENOUS

## 2016-10-08 MED ORDER — DIPHENHYDRAMINE HCL 50 MG/ML IJ SOLN
INTRAMUSCULAR | Status: AC
  Filled 2016-10-08: qty 1

## 2016-10-08 MED ORDER — SODIUM CHLORIDE 0.9 % IV SOLN
210.0000 mg | Freq: Once | INTRAVENOUS | Status: AC
  Administered 2016-10-08: 210 mg via INTRAVENOUS
  Filled 2016-10-08: qty 21

## 2016-10-08 MED ORDER — PALONOSETRON HCL INJECTION 0.25 MG/5ML
INTRAVENOUS | Status: AC
  Filled 2016-10-08: qty 5

## 2016-10-08 MED ORDER — FAMOTIDINE IN NACL 20-0.9 MG/50ML-% IV SOLN
20.0000 mg | Freq: Once | INTRAVENOUS | Status: AC
  Administered 2016-10-08: 20 mg via INTRAVENOUS

## 2016-10-08 NOTE — Telephone Encounter (Signed)
Called patient regarding 9/6 appointment

## 2016-10-08 NOTE — Telephone Encounter (Signed)
Return for no orders. Per los

## 2016-10-08 NOTE — Progress Notes (Signed)
Follow up completed with patient during infusion for tongue cancer. Patient reports he just increased Osmolite 1.5 to 1.5 bottles 4 times daily as of this morning. Weight decreased to 169 pounds from 170.3 pounds. Patient not eating foods but can drink water. Labs noted: Na 134, Glucose 216, Albumin 3.2. Denies N,V,D, and C.  Estimated Nutrition Needs: 2200-2400 calories, 90-110 grams protein, 2.4 L fluid.  Nutrition Diagnosis: Inadequate oral intake continues.  Intervention: Encouraged patient to give 1.5 bottles of Osmolite 1.5 QID with 60 mL free water before and after bolus feeding. Will increase to 6.5 bottles daily once tolerance established. Will monitor weight and tolerance. Teach back method used.  Monitoring, Evaluation, Goals: Patient will tolerate TF to meet greater than 90% estimated needs.  Next Visit: Friday, Sept 7, during infusion.

## 2016-10-08 NOTE — Patient Instructions (Signed)
[facility] Discharge Instructions for Patients Receiving Chemotherapy  Today you received the following chemotherapy agents Taxol and Carboplatin  To help prevent nausea and vomiting after your treatment, we encourage you to take your nausea medication as prescribed by MD. **NO ZOFRAN FOR 3 DAYS AFTER TREATMENT. MAY TAKE ZOFRAN ON Monday**   If you develop nausea and vomiting that is not controlled by your nausea medication, call the clinic.   BELOW ARE SYMPTOMS THAT SHOULD BE REPORTED IMMEDIATELY:  *FEVER GREATER THAN 100.5 F  *CHILLS WITH OR WITHOUT FEVER  NAUSEA AND VOMITING THAT IS NOT CONTROLLED WITH YOUR NAUSEA MEDICATION  *UNUSUAL SHORTNESS OF BREATH  *UNUSUAL BRUISING OR BLEEDING  TENDERNESS IN MOUTH AND THROAT WITH OR WITHOUT PRESENCE OF ULCERS  *URINARY PROBLEMS  *BOWEL PROBLEMS  UNUSUAL RASH Items with * indicate a potential emergency and should be followed up as soon as possible.  Feel free to call the clinic you have any questions or concerns. The clinic phone number is [phone].  Please show the [facility] at check-in to the Emergency Department and triage nurse.   Carboplatin injection What is this medicine? CARBOPLATIN (KAR boe pla tin) is a chemotherapy drug. It targets fast dividing cells, like cancer cells, and causes these cells to die. This medicine is used to treat ovarian cancer and many other cancers. This medicine may be used for other purposes; ask your health care provider or pharmacist if you have questions. COMMON BRAND NAME(S): Paraplatin What should I tell my health care provider before I take this medicine? They need to know if you have any of these conditions: -blood disorders -hearing problems -kidney disease -recent or ongoing radiation therapy -an unusual or allergic reaction to carboplatin, cisplatin, other chemotherapy, other medicines, foods, dyes, or preservatives -pregnant or trying to get  pregnant -breast-feeding How should I use this medicine? This drug is usually given as an infusion into a vein. It is administered in a hospital or clinic by a specially trained health care professional. Talk to your pediatrician regarding the use of this medicine in children. Special care may be needed. Overdosage: If you think you have taken too much of this medicine contact a poison control center or emergency room at once. NOTE: This medicine is only for you. Do not share this medicine with others. What if I miss a dose? It is important not to miss a dose. Call your doctor or health care professional if you are unable to keep an appointment. What may interact with this medicine? -medicines for seizures -medicines to increase blood counts like filgrastim, pegfilgrastim, sargramostim -some antibiotics like amikacin, gentamicin, neomycin, streptomycin, tobramycin -vaccines Talk to your doctor or health care professional before taking any of these medicines: -acetaminophen -aspirin -ibuprofen -ketoprofen -naproxen This list may not describe all possible interactions. Give your health care provider a list of all the medicines, herbs, non-prescription drugs, or dietary supplements you use. Also tell them if you smoke, drink alcohol, or use illegal drugs. Some items may interact with your medicine. What should I watch for while using this medicine? Your condition will be monitored carefully while you are receiving this medicine. You will need important blood work done while you are taking this medicine. This drug may make you feel generally unwell. This is not uncommon, as chemotherapy can affect healthy cells as well as cancer cells. Report any side effects. Continue your course of treatment even though you feel ill unless your doctor tells you to stop.  In some cases, you may be given additional medicines to help with side effects. Follow all directions for their use. Call your doctor or  health care professional for advice if you get a fever, chills or sore throat, or other symptoms of a cold or flu. Do not treat yourself. This drug decreases your body's ability to fight infections. Try to avoid being around people who are sick. This medicine may increase your risk to bruise or bleed. Call your doctor or health care professional if you notice any unusual bleeding. Be careful brushing and flossing your teeth or using a toothpick because you may get an infection or bleed more easily. If you have any dental work done, tell your dentist you are receiving this medicine. Avoid taking products that contain aspirin, acetaminophen, ibuprofen, naproxen, or ketoprofen unless instructed by your doctor. These medicines may hide a fever. Do not become pregnant while taking this medicine. Women should inform their doctor if they wish to become pregnant or think they might be pregnant. There is a potential for serious side effects to an unborn child. Talk to your health care professional or pharmacist for more information. Do not breast-feed an infant while taking this medicine. What side effects may I notice from receiving this medicine? Side effects that you should report to your doctor or health care professional as soon as possible: -allergic reactions like skin rash, itching or hives, swelling of the face, lips, or tongue -signs of infection - fever or chills, cough, sore throat, pain or difficulty passing urine -signs of decreased platelets or bleeding - bruising, pinpoint red spots on the skin, black, tarry stools, nosebleeds -signs of decreased red blood cells - unusually weak or tired, fainting spells, lightheadedness -breathing problems -changes in hearing -changes in vision -chest pain -high blood pressure -low blood counts - This drug may decrease the number of white blood cells, red blood cells and platelets. You may be at increased risk for infections and bleeding. -nausea and  vomiting -pain, swelling, redness or irritation at the injection site -pain, tingling, numbness in the hands or feet -problems with balance, talking, walking -trouble passing urine or change in the amount of urine Side effects that usually do not require medical attention (report to your doctor or health care professional if they continue or are bothersome): -hair loss -loss of appetite -metallic taste in the mouth or changes in taste This list may not describe all possible side effects. Call your doctor for medical advice about side effects. You may report side effects to FDA at 1-800-FDA-1088. Where should I keep my medicine? This drug is given in a hospital or clinic and will not be stored at home. NOTE: This sheet is a summary. It may not cover all possible information. If you have questions about this medicine, talk to your doctor, pharmacist, or health care provider.  2018 Elsevier/Gold Standard (2007-05-02 14:38:05)   Paclitaxel injection What is this medicine? PACLITAXEL (PAK li TAX el) is a chemotherapy drug. It targets fast dividing cells, like cancer cells, and causes these cells to die. This medicine is used to treat ovarian cancer, breast cancer, and other cancers. This medicine may be used for other purposes; ask your health care provider or pharmacist if you have questions. COMMON BRAND NAME(S): Onxol, Taxol What should I tell my health care provider before I take this medicine? They need to know if you have any of these conditions: -blood disorders -irregular heartbeat -infection (especially a virus infection such as chickenpox, cold  sores, or herpes) -liver disease -previous or ongoing radiation therapy -an unusual or allergic reaction to paclitaxel, alcohol, polyoxyethylated castor oil, other chemotherapy agents, other medicines, foods, dyes, or preservatives -pregnant or trying to get pregnant -breast-feeding How should I use this medicine? This drug is given as an  infusion into a vein. It is administered in a hospital or clinic by a specially trained health care professional. Talk to your pediatrician regarding the use of this medicine in children. Special care may be needed. Overdosage: If you think you have taken too much of this medicine contact a poison control center or emergency room at once. NOTE: This medicine is only for you. Do not share this medicine with others. What if I miss a dose? It is important not to miss your dose. Call your doctor or health care professional if you are unable to keep an appointment. What may interact with this medicine? Do not take this medicine with any of the following medications: -disulfiram -metronidazole This medicine may also interact with the following medications: -cyclosporine -diazepam -ketoconazole -medicines to increase blood counts like filgrastim, pegfilgrastim, sargramostim -other chemotherapy drugs like cisplatin, doxorubicin, epirubicin, etoposide, teniposide, vincristine -quinidine -testosterone -vaccines -verapamil Talk to your doctor or health care professional before taking any of these medicines: -acetaminophen -aspirin -ibuprofen -ketoprofen -naproxen This list may not describe all possible interactions. Give your health care provider a list of all the medicines, herbs, non-prescription drugs, or dietary supplements you use. Also tell them if you smoke, drink alcohol, or use illegal drugs. Some items may interact with your medicine. What should I watch for while using this medicine? Your condition will be monitored carefully while you are receiving this medicine. You will need important blood work done while you are taking this medicine. This medicine can cause serious allergic reactions. To reduce your risk you will need to take other medicine(s) before treatment with this medicine. If you experience allergic reactions like skin rash, itching or hives, swelling of the face, lips, or  tongue, tell your doctor or health care professional right away. In some cases, you may be given additional medicines to help with side effects. Follow all directions for their use. This drug may make you feel generally unwell. This is not uncommon, as chemotherapy can affect healthy cells as well as cancer cells. Report any side effects. Continue your course of treatment even though you feel ill unless your doctor tells you to stop. Call your doctor or health care professional for advice if you get a fever, chills or sore throat, or other symptoms of a cold or flu. Do not treat yourself. This drug decreases your body's ability to fight infections. Try to avoid being around people who are sick. This medicine may increase your risk to bruise or bleed. Call your doctor or health care professional if you notice any unusual bleeding. Be careful brushing and flossing your teeth or using a toothpick because you may get an infection or bleed more easily. If you have any dental work done, tell your dentist you are receiving this medicine. Avoid taking products that contain aspirin, acetaminophen, ibuprofen, naproxen, or ketoprofen unless instructed by your doctor. These medicines may hide a fever. Do not become pregnant while taking this medicine. Women should inform their doctor if they wish to become pregnant or think they might be pregnant. There is a potential for serious side effects to an unborn child. Talk to your health care professional or pharmacist for more information. Do not breast-feed an  infant while taking this medicine. Men are advised not to father a child while receiving this medicine. This product may contain alcohol. Ask your pharmacist or healthcare provider if this medicine contains alcohol. Be sure to tell all healthcare providers you are taking this medicine. Certain medicines, like metronidazole and disulfiram, can cause an unpleasant reaction when taken with alcohol. The reaction includes  flushing, headache, nausea, vomiting, sweating, and increased thirst. The reaction can last from 30 minutes to several hours. What side effects may I notice from receiving this medicine? Side effects that you should report to your doctor or health care professional as soon as possible: -allergic reactions like skin rash, itching or hives, swelling of the face, lips, or tongue -low blood counts - This drug may decrease the number of white blood cells, red blood cells and platelets. You may be at increased risk for infections and bleeding. -signs of infection - fever or chills, cough, sore throat, pain or difficulty passing urine -signs of decreased platelets or bleeding - bruising, pinpoint red spots on the skin, black, tarry stools, nosebleeds -signs of decreased red blood cells - unusually weak or tired, fainting spells, lightheadedness -breathing problems -chest pain -high or low blood pressure -mouth sores -nausea and vomiting -pain, swelling, redness or irritation at the injection site -pain, tingling, numbness in the hands or feet -slow or irregular heartbeat -swelling of the ankle, feet, hands Side effects that usually do not require medical attention (report to your doctor or health care professional if they continue or are bothersome): -bone pain -complete hair loss including hair on your head, underarms, pubic hair, eyebrows, and eyelashes -changes in the color of fingernails -diarrhea -loosening of the fingernails -loss of appetite -muscle or joint pain -red flush to skin -sweating This list may not describe all possible side effects. Call your doctor for medical advice about side effects. You may report side effects to FDA at 1-800-FDA-1088. Where should I keep my medicine? This drug is given in a hospital or clinic and will not be stored at home. NOTE: This sheet is a summary. It may not cover all possible information. If you have questions about this medicine, talk to your  doctor, pharmacist, or health care provider.  2018 Elsevier/Gold Standard (2014-11-26 19:58:00)

## 2016-10-08 NOTE — Progress Notes (Signed)
Financial Counselor--Patient and daughter brought income verification--qualifies for 400.00 grant--scanned information into sharepoint and gave him a gas card

## 2016-10-11 ENCOUNTER — Encounter: Payer: Self-pay | Admitting: Hematology and Oncology

## 2016-10-12 ENCOUNTER — Other Ambulatory Visit: Payer: Self-pay | Admitting: Radiation Oncology

## 2016-10-12 ENCOUNTER — Telehealth: Payer: Self-pay | Admitting: *Deleted

## 2016-10-12 ENCOUNTER — Ambulatory Visit
Admission: RE | Admit: 2016-10-12 | Discharge: 2016-10-12 | Disposition: A | Discharge status: Home / Self Care | Payer: Medicare Other | Source: Ambulatory Visit | Attending: Radiation Oncology | Admitting: Radiation Oncology

## 2016-10-12 DIAGNOSIS — C01 Malignant neoplasm of base of tongue: Secondary | ICD-10-CM

## 2016-10-12 DIAGNOSIS — Z510 Encounter for antineoplastic radiation therapy: Secondary | ICD-10-CM | POA: Diagnosis not present

## 2016-10-12 MED ORDER — LORAZEPAM 0.5 MG PO TABS: ORAL_TABLET | ORAL | 0 refills | Status: DC

## 2016-10-12 NOTE — Telephone Encounter (Signed)
1) Nutrition  I will get La Dibella (my nurse) to contact the nutritionist related to the formula   Update:RN left a message for dietician.   2) Constipation  Does diarrhea means he started to have bowel movement? He may need to continue on laxatives until has started to have "good bowel movement".   Update: Bowels are moving now- using miralax and senna daily.   3) Pain  Has he started fentanyl patch? Does he find that helpful? If the pain medication every 2 hours enough to control his pain?   Update: Pt is NOT using fentanyl patch because it was too strong. Is using liquid morphine with relief. Hurts to swallow, but is drinking and taking in water through tube.  4) Anxiety attacks  He can take lorazepam every 4-6 hours as needed for anxiety.  Update: Is using lorazepam with good results.

## 2016-10-13 ENCOUNTER — Ambulatory Visit
Admission: RE | Admit: 2016-10-13 | Discharge: 2016-10-13 | Disposition: A | Discharge status: Home / Self Care | Payer: Medicare Other | Source: Ambulatory Visit | Attending: Radiation Oncology | Admitting: Radiation Oncology

## 2016-10-13 ENCOUNTER — Ambulatory Visit: Payer: Medicare Other | Admitting: Nutrition

## 2016-10-13 ENCOUNTER — Ambulatory Visit: Payer: Self-pay | Admitting: Hematology and Oncology

## 2016-10-13 ENCOUNTER — Telehealth: Payer: Self-pay | Admitting: Nutrition

## 2016-10-13 DIAGNOSIS — Z510 Encounter for antineoplastic radiation therapy: Secondary | ICD-10-CM | POA: Diagnosis not present

## 2016-10-13 NOTE — Telephone Encounter (Signed)
I spoke with patient and his daughter over telephone to address concerns regarding tube feeding. Patient reports he has had constipation and is just starting to have bowel movements. He feels that the Osmolite 1.5 caused his constipation. Patient denies other nutrition impact symptoms related to tube feeding. Reports good tolerance to formula. Patient reports he had a good bowel movement today after taking MiraLAX, and senna.  I expect constipation is related to nausea and pain medication. I have recommended patient try Osmolite 1.5 as originally educated. Patient educated to continue MiraLAX, and senna as prescribed by M.D. Encouraged plenty of fluids. I will follow-up with patient on Friday and make tube feeding changes as needed. Patient is agreeable to plan.  Teach back method was used.  **Disclaimer: This note was dictated with voice recognition software. Similar sounding words can inadvertently be transcribed and this note may contain transcription errors which may not have been corrected upon publication of note.**

## 2016-10-14 ENCOUNTER — Telehealth: Payer: Self-pay | Admitting: Hematology and Oncology

## 2016-10-14 ENCOUNTER — Encounter: Payer: Self-pay | Admitting: *Deleted

## 2016-10-14 ENCOUNTER — Ambulatory Visit (HOSPITAL_BASED_OUTPATIENT_CLINIC_OR_DEPARTMENT_OTHER): Payer: Medicare Other | Admitting: Hematology and Oncology

## 2016-10-14 ENCOUNTER — Ambulatory Visit
Admission: RE | Admit: 2016-10-14 | Discharge: 2016-10-14 | Disposition: A | Discharge status: Home / Self Care | Payer: Medicare Other | Source: Ambulatory Visit | Attending: Radiation Oncology | Admitting: Radiation Oncology

## 2016-10-14 ENCOUNTER — Ambulatory Visit (HOSPITAL_BASED_OUTPATIENT_CLINIC_OR_DEPARTMENT_OTHER): Payer: Medicare Other

## 2016-10-14 VITALS — BP 97/62 | HR 94 | Temp 99.6°F | Resp 18 | Ht 69.0 in | Wt 167.9 lb

## 2016-10-14 DIAGNOSIS — Z510 Encounter for antineoplastic radiation therapy: Secondary | ICD-10-CM | POA: Diagnosis not present

## 2016-10-14 DIAGNOSIS — C01 Malignant neoplasm of base of tongue: Secondary | ICD-10-CM | POA: Diagnosis not present

## 2016-10-14 DIAGNOSIS — C770 Secondary and unspecified malignant neoplasm of lymph nodes of head, face and neck: Secondary | ICD-10-CM | POA: Diagnosis not present

## 2016-10-14 DIAGNOSIS — R52 Pain, unspecified: Secondary | ICD-10-CM

## 2016-10-14 DIAGNOSIS — Z5111 Encounter for antineoplastic chemotherapy: Secondary | ICD-10-CM

## 2016-10-14 DIAGNOSIS — G893 Neoplasm related pain (acute) (chronic): Secondary | ICD-10-CM

## 2016-10-14 LAB — CBC WITH DIFFERENTIAL/PLATELET
BASO%: 0.4 % (ref 0.0–2.0)
BASOS ABS: 0 10*3/uL (ref 0.0–0.1)
EOS ABS: 0 10*3/uL (ref 0.0–0.5)
EOS%: 1.5 % (ref 0.0–7.0)
HCT: 37.2 % — ABNORMAL LOW (ref 38.4–49.9)
HGB: 12.3 g/dL — ABNORMAL LOW (ref 13.0–17.1)
LYMPH%: 8.6 % — AB (ref 14.0–49.0)
MCH: 30.6 pg (ref 27.2–33.4)
MCHC: 33 g/dL (ref 32.0–36.0)
MCV: 92.7 fL (ref 79.3–98.0)
MONO#: 0.2 10*3/uL (ref 0.1–0.9)
MONO%: 9.5 % (ref 0.0–14.0)
NEUT#: 2.1 10*3/uL (ref 1.5–6.5)
NEUT%: 80 % — AB (ref 39.0–75.0)
PLATELETS: 188 10*3/uL (ref 140–400)
RBC: 4.02 10*6/uL — ABNORMAL LOW (ref 4.20–5.82)
RDW: 12.9 % (ref 11.0–14.6)
WBC: 2.6 10*3/uL — ABNORMAL LOW (ref 4.0–10.3)
lymph#: 0.2 10*3/uL — ABNORMAL LOW (ref 0.9–3.3)

## 2016-10-14 LAB — COMPREHENSIVE METABOLIC PANEL
ALBUMIN: 2.9 g/dL — AB (ref 3.5–5.0)
ALK PHOS: 83 U/L (ref 40–150)
ALT: 84 U/L — AB (ref 0–55)
AST: 23 U/L (ref 5–34)
Anion Gap: 9 mEq/L (ref 3–11)
BUN: 11.8 mg/dL (ref 7.0–26.0)
CO2: 31 mEq/L — ABNORMAL HIGH (ref 22–29)
CREATININE: 0.7 mg/dL (ref 0.7–1.3)
Calcium: 9.2 mg/dL (ref 8.4–10.4)
Chloride: 96 mEq/L — ABNORMAL LOW (ref 98–109)
EGFR: >90 mL/min/{1.73_m2} (ref >90)
GLUCOSE: 102 mg/dL (ref 70–140)
POTASSIUM: 4.6 meq/L (ref 3.5–5.1)
SODIUM: 136 meq/L (ref 136–145)
Total Bilirubin: 0.42 mg/dL (ref 0.20–1.20)
Total Protein: 6.5 g/dL (ref 6.4–8.3)

## 2016-10-14 LAB — MAGNESIUM: MAGNESIUM: 2 mg/dL (ref 1.5–2.5)

## 2016-10-14 MED ORDER — MORPHINE SULFATE (CONCENTRATE) 20 MG/ML PO SOLN: 20.0000 mg | ORAL | 0 refills | Status: DC | PRN

## 2016-10-14 NOTE — Telephone Encounter (Signed)
Gave avs patient decline calendar for september

## 2016-10-15 ENCOUNTER — Ambulatory Visit
Admission: RE | Admit: 2016-10-15 | Discharge: 2016-10-15 | Disposition: A | Discharge status: Home / Self Care | Payer: Medicare Other | Source: Ambulatory Visit | Attending: Radiation Oncology | Admitting: Radiation Oncology

## 2016-10-15 ENCOUNTER — Ambulatory Visit: Payer: Medicare Other | Admitting: Nutrition

## 2016-10-15 ENCOUNTER — Ambulatory Visit (HOSPITAL_BASED_OUTPATIENT_CLINIC_OR_DEPARTMENT_OTHER): Payer: Medicare Other

## 2016-10-15 ENCOUNTER — Telehealth: Payer: Self-pay | Admitting: *Deleted

## 2016-10-15 VITALS — BP 122/74 | HR 90 | Temp 99.6°F | Resp 18

## 2016-10-15 DIAGNOSIS — Z510 Encounter for antineoplastic radiation therapy: Secondary | ICD-10-CM | POA: Diagnosis not present

## 2016-10-15 DIAGNOSIS — C770 Secondary and unspecified malignant neoplasm of lymph nodes of head, face and neck: Secondary | ICD-10-CM | POA: Diagnosis not present

## 2016-10-15 DIAGNOSIS — Z5111 Encounter for antineoplastic chemotherapy: Secondary | ICD-10-CM | POA: Diagnosis not present

## 2016-10-15 DIAGNOSIS — C01 Malignant neoplasm of base of tongue: Secondary | ICD-10-CM | POA: Diagnosis not present

## 2016-10-15 MED ORDER — DEXAMETHASONE SODIUM PHOSPHATE 100 MG/10ML IJ SOLN
20.0000 mg | Freq: Once | INTRAMUSCULAR | Status: AC
  Administered 2016-10-15: 20 mg via INTRAVENOUS
  Filled 2016-10-15: qty 2

## 2016-10-15 MED ORDER — DIPHENHYDRAMINE HCL 50 MG/ML IJ SOLN
25.0000 mg | Freq: Once | INTRAMUSCULAR | Status: AC
  Administered 2016-10-15: 25 mg via INTRAVENOUS

## 2016-10-15 MED ORDER — DIPHENHYDRAMINE HCL 50 MG/ML IJ SOLN
INTRAMUSCULAR | Status: AC
  Filled 2016-10-15: qty 1

## 2016-10-15 MED ORDER — FAMOTIDINE IN NACL 20-0.9 MG/50ML-% IV SOLN
20.0000 mg | Freq: Once | INTRAVENOUS | Status: AC
  Administered 2016-10-15: 20 mg via INTRAVENOUS

## 2016-10-15 MED ORDER — HEPARIN SOD (PORK) LOCK FLUSH 100 UNIT/ML IV SOLN
500.0000 [IU] | Freq: Once | INTRAVENOUS | Status: AC | PRN
  Administered 2016-10-15: 500 [IU]
  Filled 2016-10-15: qty 5

## 2016-10-15 MED ORDER — SODIUM CHLORIDE 0.9% FLUSH
10.0000 mL | INTRAVENOUS | Status: DC | PRN
  Administered 2016-10-15: 10 mL
  Filled 2016-10-15: qty 10

## 2016-10-15 MED ORDER — PALONOSETRON HCL INJECTION 0.25 MG/5ML
0.2500 mg | Freq: Once | INTRAVENOUS | Status: AC
  Administered 2016-10-15: 0.25 mg via INTRAVENOUS

## 2016-10-15 MED ORDER — SODIUM CHLORIDE 0.9 % IV SOLN
210.0000 mg | Freq: Once | INTRAVENOUS | Status: AC
  Administered 2016-10-15: 210 mg via INTRAVENOUS
  Filled 2016-10-15: qty 21

## 2016-10-15 MED ORDER — SODIUM CHLORIDE 0.9 % IV SOLN
Freq: Once | INTRAVENOUS | Status: AC
  Administered 2016-10-15: 12:00:00 via INTRAVENOUS

## 2016-10-15 MED ORDER — FAMOTIDINE IN NACL 20-0.9 MG/50ML-% IV SOLN
INTRAVENOUS | Status: AC
  Filled 2016-10-15: qty 50

## 2016-10-15 MED ORDER — PALONOSETRON HCL INJECTION 0.25 MG/5ML
INTRAVENOUS | Status: AC
Start: 2016-10-15 — End: 2016-10-15
  Filled 2016-10-15: qty 5

## 2016-10-15 MED ORDER — SODIUM CHLORIDE 0.9 % IV SOLN
45.0000 mg/m2 | Freq: Once | INTRAVENOUS | Status: AC
  Administered 2016-10-15: 90 mg via INTRAVENOUS
  Filled 2016-10-15: qty 15

## 2016-10-15 NOTE — Progress Notes (Signed)
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 Patient reports his constipation has resolved. He is tolerating Osmolite 1.5 and wonders if he can infuse 2 cans at 1 time - 3 times a day. He would like to be able to go to bed a little earlier. No nutrition impact symptoms. Reports he is drinking a lot of water.  Nutrition diagnosis: Inadequate oral intake continues.  Intervention: Educated patient to try to 2 bottles Osmolite 1.5 - 3 times a day with 60 mL free water for and after bolus feedings. Will continue to monitor weight and tolerance of tube feeding. Teach back method used.  Monitoring, evaluation, goals: Patient will tolerate tube feeding at goal rate to minimize weight loss.  Next visit: Friday, September 14 during infusion.  **Disclaimer: This note was dictated with voice recognition software. Similar sounding words can inadvertently be transcribed and this note may contain transcription errors which may not have been corrected upon publication of note.**

## 2016-10-15 NOTE — Telephone Encounter (Addendum)
Oncology Nurse Navigator Documentation  Per patient daughter request, faxed medical records collated by Melanee Spry [facility] to Nickolas Madrid, [facility].  Notification of successful fax transmissions received.  LVMM for dtr noting records successfully faxed.  Gayleen Orem, RN, BSN, [place] [facility] Nurse [place] [facility] [place] [phone]

## 2016-10-15 NOTE — Patient Instructions (Signed)
[facility] Discharge Instructions for Patients Receiving Chemotherapy  Today you received the following chemotherapy agents Taxol and Carboplatin  To help prevent nausea and vomiting after your treatment, we encourage you to take your nausea medication as prescribed by MD. **NO ZOFRAN FOR 3 DAYS AFTER TREATMENT. MAY TAKE ZOFRAN ON Monday**   If you develop nausea and vomiting that is not controlled by your nausea medication, call the clinic.   BELOW ARE SYMPTOMS THAT SHOULD BE REPORTED IMMEDIATELY:  *FEVER GREATER THAN 100.5 F  *CHILLS WITH OR WITHOUT FEVER  NAUSEA AND VOMITING THAT IS NOT CONTROLLED WITH YOUR NAUSEA MEDICATION  *UNUSUAL SHORTNESS OF BREATH  *UNUSUAL BRUISING OR BLEEDING  TENDERNESS IN MOUTH AND THROAT WITH OR WITHOUT PRESENCE OF ULCERS  *URINARY PROBLEMS  *BOWEL PROBLEMS  UNUSUAL RASH Items with * indicate a potential emergency and should be followed up as soon as possible.  Feel free to call the clinic you have any questions or concerns. The clinic phone number is [phone].  Please show the [facility] at check-in to the Emergency Department and triage nurse.   Carboplatin injection What is this medicine? CARBOPLATIN (KAR boe pla tin) is a chemotherapy drug. It targets fast dividing cells, like cancer cells, and causes these cells to die. This medicine is used to treat ovarian cancer and many other cancers. This medicine may be used for other purposes; ask your health care provider or pharmacist if you have questions. COMMON BRAND NAME(S): Paraplatin What should I tell my health care provider before I take this medicine? They need to know if you have any of these conditions: -blood disorders -hearing problems -kidney disease -recent or ongoing radiation therapy -an unusual or allergic reaction to carboplatin, cisplatin, other chemotherapy, other medicines, foods, dyes, or preservatives -pregnant or trying to get  pregnant -breast-feeding How should I use this medicine? This drug is usually given as an infusion into a vein. It is administered in a hospital or clinic by a specially trained health care professional. Talk to your pediatrician regarding the use of this medicine in children. Special care may be needed. Overdosage: If you think you have taken too much of this medicine contact a poison control center or emergency room at once. NOTE: This medicine is only for you. Do not share this medicine with others. What if I miss a dose? It is important not to miss a dose. Call your doctor or health care professional if you are unable to keep an appointment. What may interact with this medicine? -medicines for seizures -medicines to increase blood counts like filgrastim, pegfilgrastim, sargramostim -some antibiotics like amikacin, gentamicin, neomycin, streptomycin, tobramycin -vaccines Talk to your doctor or health care professional before taking any of these medicines: -acetaminophen -aspirin -ibuprofen -ketoprofen -naproxen This list may not describe all possible interactions. Give your health care provider a list of all the medicines, herbs, non-prescription drugs, or dietary supplements you use. Also tell them if you smoke, drink alcohol, or use illegal drugs. Some items may interact with your medicine. What should I watch for while using this medicine? Your condition will be monitored carefully while you are receiving this medicine. You will need important blood work done while you are taking this medicine. This drug may make you feel generally unwell. This is not uncommon, as chemotherapy can affect healthy cells as well as cancer cells. Report any side effects. Continue your course of treatment even though you feel ill unless your doctor tells you to stop.  In some cases, you may be given additional medicines to help with side effects. Follow all directions for their use. Call your doctor or  health care professional for advice if you get a fever, chills or sore throat, or other symptoms of a cold or flu. Do not treat yourself. This drug decreases your body's ability to fight infections. Try to avoid being around people who are sick. This medicine may increase your risk to bruise or bleed. Call your doctor or health care professional if you notice any unusual bleeding. Be careful brushing and flossing your teeth or using a toothpick because you may get an infection or bleed more easily. If you have any dental work done, tell your dentist you are receiving this medicine. Avoid taking products that contain aspirin, acetaminophen, ibuprofen, naproxen, or ketoprofen unless instructed by your doctor. These medicines may hide a fever. Do not become pregnant while taking this medicine. Women should inform their doctor if they wish to become pregnant or think they might be pregnant. There is a potential for serious side effects to an unborn child. Talk to your health care professional or pharmacist for more information. Do not breast-feed an infant while taking this medicine. What side effects may I notice from receiving this medicine? Side effects that you should report to your doctor or health care professional as soon as possible: -allergic reactions like skin rash, itching or hives, swelling of the face, lips, or tongue -signs of infection - fever or chills, cough, sore throat, pain or difficulty passing urine -signs of decreased platelets or bleeding - bruising, pinpoint red spots on the skin, black, tarry stools, nosebleeds -signs of decreased red blood cells - unusually weak or tired, fainting spells, lightheadedness -breathing problems -changes in hearing -changes in vision -chest pain -high blood pressure -low blood counts - This drug may decrease the number of white blood cells, red blood cells and platelets. You may be at increased risk for infections and bleeding. -nausea and  vomiting -pain, swelling, redness or irritation at the injection site -pain, tingling, numbness in the hands or feet -problems with balance, talking, walking -trouble passing urine or change in the amount of urine Side effects that usually do not require medical attention (report to your doctor or health care professional if they continue or are bothersome): -hair loss -loss of appetite -metallic taste in the mouth or changes in taste This list may not describe all possible side effects. Call your doctor for medical advice about side effects. You may report side effects to FDA at 1-800-FDA-1088. Where should I keep my medicine? This drug is given in a hospital or clinic and will not be stored at home. NOTE: This sheet is a summary. It may not cover all possible information. If you have questions about this medicine, talk to your doctor, pharmacist, or health care provider.  2018 Elsevier/Gold Standard (2007-05-02 14:38:05)   Paclitaxel injection What is this medicine? PACLITAXEL (PAK li TAX el) is a chemotherapy drug. It targets fast dividing cells, like cancer cells, and causes these cells to die. This medicine is used to treat ovarian cancer, breast cancer, and other cancers. This medicine may be used for other purposes; ask your health care provider or pharmacist if you have questions. COMMON BRAND NAME(S): Onxol, Taxol What should I tell my health care provider before I take this medicine? They need to know if you have any of these conditions: -blood disorders -irregular heartbeat -infection (especially a virus infection such as chickenpox, cold  sores, or herpes) -liver disease -previous or ongoing radiation therapy -an unusual or allergic reaction to paclitaxel, alcohol, polyoxyethylated castor oil, other chemotherapy agents, other medicines, foods, dyes, or preservatives -pregnant or trying to get pregnant -breast-feeding How should I use this medicine? This drug is given as an  infusion into a vein. It is administered in a hospital or clinic by a specially trained health care professional. Talk to your pediatrician regarding the use of this medicine in children. Special care may be needed. Overdosage: If you think you have taken too much of this medicine contact a poison control center or emergency room at once. NOTE: This medicine is only for you. Do not share this medicine with others. What if I miss a dose? It is important not to miss your dose. Call your doctor or health care professional if you are unable to keep an appointment. What may interact with this medicine? Do not take this medicine with any of the following medications: -disulfiram -metronidazole This medicine may also interact with the following medications: -cyclosporine -diazepam -ketoconazole -medicines to increase blood counts like filgrastim, pegfilgrastim, sargramostim -other chemotherapy drugs like cisplatin, doxorubicin, epirubicin, etoposide, teniposide, vincristine -quinidine -testosterone -vaccines -verapamil Talk to your doctor or health care professional before taking any of these medicines: -acetaminophen -aspirin -ibuprofen -ketoprofen -naproxen This list may not describe all possible interactions. Give your health care provider a list of all the medicines, herbs, non-prescription drugs, or dietary supplements you use. Also tell them if you smoke, drink alcohol, or use illegal drugs. Some items may interact with your medicine. What should I watch for while using this medicine? Your condition will be monitored carefully while you are receiving this medicine. You will need important blood work done while you are taking this medicine. This medicine can cause serious allergic reactions. To reduce your risk you will need to take other medicine(s) before treatment with this medicine. If you experience allergic reactions like skin rash, itching or hives, swelling of the face, lips, or  tongue, tell your doctor or health care professional right away. In some cases, you may be given additional medicines to help with side effects. Follow all directions for their use. This drug may make you feel generally unwell. This is not uncommon, as chemotherapy can affect healthy cells as well as cancer cells. Report any side effects. Continue your course of treatment even though you feel ill unless your doctor tells you to stop. Call your doctor or health care professional for advice if you get a fever, chills or sore throat, or other symptoms of a cold or flu. Do not treat yourself. This drug decreases your body's ability to fight infections. Try to avoid being around people who are sick. This medicine may increase your risk to bruise or bleed. Call your doctor or health care professional if you notice any unusual bleeding. Be careful brushing and flossing your teeth or using a toothpick because you may get an infection or bleed more easily. If you have any dental work done, tell your dentist you are receiving this medicine. Avoid taking products that contain aspirin, acetaminophen, ibuprofen, naproxen, or ketoprofen unless instructed by your doctor. These medicines may hide a fever. Do not become pregnant while taking this medicine. Women should inform their doctor if they wish to become pregnant or think they might be pregnant. There is a potential for serious side effects to an unborn child. Talk to your health care professional or pharmacist for more information. Do not breast-feed an  infant while taking this medicine. Men are advised not to father a child while receiving this medicine. This product may contain alcohol. Ask your pharmacist or healthcare provider if this medicine contains alcohol. Be sure to tell all healthcare providers you are taking this medicine. Certain medicines, like metronidazole and disulfiram, can cause an unpleasant reaction when taken with alcohol. The reaction includes  flushing, headache, nausea, vomiting, sweating, and increased thirst. The reaction can last from 30 minutes to several hours. What side effects may I notice from receiving this medicine? Side effects that you should report to your doctor or health care professional as soon as possible: -allergic reactions like skin rash, itching or hives, swelling of the face, lips, or tongue -low blood counts - This drug may decrease the number of white blood cells, red blood cells and platelets. You may be at increased risk for infections and bleeding. -signs of infection - fever or chills, cough, sore throat, pain or difficulty passing urine -signs of decreased platelets or bleeding - bruising, pinpoint red spots on the skin, black, tarry stools, nosebleeds -signs of decreased red blood cells - unusually weak or tired, fainting spells, lightheadedness -breathing problems -chest pain -high or low blood pressure -mouth sores -nausea and vomiting -pain, swelling, redness or irritation at the injection site -pain, tingling, numbness in the hands or feet -slow or irregular heartbeat -swelling of the ankle, feet, hands Side effects that usually do not require medical attention (report to your doctor or health care professional if they continue or are bothersome): -bone pain -complete hair loss including hair on your head, underarms, pubic hair, eyebrows, and eyelashes -changes in the color of fingernails -diarrhea -loosening of the fingernails -loss of appetite -muscle or joint pain -red flush to skin -sweating This list may not describe all possible side effects. Call your doctor for medical advice about side effects. You may report side effects to FDA at 1-800-FDA-1088. Where should I keep my medicine? This drug is given in a hospital or clinic and will not be stored at home. NOTE: This sheet is a summary. It may not cover all possible information. If you have questions about this medicine, talk to your  doctor, pharmacist, or health care provider.  2018 Elsevier/Gold Standard (2014-11-26 19:58:00)

## 2016-10-15 NOTE — Progress Notes (Signed)
Oncology Nurse Navigator Documentation  To provide support, encouragement and care continuity, met with Mr. Stein during Est Pt appt with Dr. Lebron Conners.  He was accompanied by his dtr, [name]. He reported:  Throat pain controlled w/ MSIR and Roxanol.  He was encouraged to include viscous lidocaine for additional relief.  Ongoing anxiety re tmt, taking Ativan q6 hrs.  Per Dr. Lebron Conners, he was guided to increase to q4 hrs as tolerated with analgesics.  Denied using Sonafine as previously directed.  I explained importance of starting BID application to treatment area to help maintain skin integrity, he voiced understanding. To further address anxiety, I provided further discussion of treatment regime, emphasized SEs he is experiencing are WNL at this point in tmt.  Dr. Lebron Conners provided further discussion of timeframe of re-staging PET.  Dr. Lebron Conners further addressed patient and dtr anxiety re tmt efficacy by noting palpable and visual decrease in size of R neck lymph node. Dtr indicated they had requested about 2 weeks ago medical records be sent to [facility], signed medical information release form to facilitate, "spoke with Maudie Mercury."  She noted [facility].  I indicated I would follow-up on her behalf.  Gayleen Orem, RN, BSN, [facility] Nurse [place] [place] [phone]

## 2016-10-18 ENCOUNTER — Ambulatory Visit
Admission: RE | Admit: 2016-10-18 | Discharge: 2016-10-18 | Disposition: A | Discharge status: Home / Self Care | Payer: Medicare Other | Source: Ambulatory Visit | Attending: Radiation Oncology | Admitting: Radiation Oncology

## 2016-10-18 ENCOUNTER — Other Ambulatory Visit (HOSPITAL_BASED_OUTPATIENT_CLINIC_OR_DEPARTMENT_OTHER): Payer: Medicare Other

## 2016-10-18 DIAGNOSIS — C01 Malignant neoplasm of base of tongue: Secondary | ICD-10-CM

## 2016-10-18 DIAGNOSIS — C770 Secondary and unspecified malignant neoplasm of lymph nodes of head, face and neck: Secondary | ICD-10-CM

## 2016-10-18 DIAGNOSIS — Z510 Encounter for antineoplastic radiation therapy: Secondary | ICD-10-CM | POA: Diagnosis not present

## 2016-10-18 LAB — COMPREHENSIVE METABOLIC PANEL
ALBUMIN: 2.9 g/dL — AB (ref 3.5–5.0)
ALK PHOS: 90 U/L (ref 40–150)
ALT: 77 U/L — ABNORMAL HIGH (ref 0–55)
AST: 23 U/L (ref 5–34)
Anion Gap: 8 mEq/L (ref 3–11)
BILIRUBIN TOTAL: 0.41 mg/dL (ref 0.20–1.20)
BUN: 12.7 mg/dL (ref 7.0–26.0)
CALCIUM: 9.1 mg/dL (ref 8.4–10.4)
CO2: 30 mEq/L — ABNORMAL HIGH (ref 22–29)
CREATININE: 0.6 mg/dL — AB (ref 0.7–1.3)
Chloride: 97 mEq/L — ABNORMAL LOW (ref 98–109)
EGFR: >90 mL/min/{1.73_m2} (ref >90)
GLUCOSE: 115 mg/dL (ref 70–140)
POTASSIUM: 4.1 meq/L (ref 3.5–5.1)
SODIUM: 136 meq/L (ref 136–145)
TOTAL PROTEIN: 6.6 g/dL (ref 6.4–8.3)

## 2016-10-18 LAB — CBC WITH DIFFERENTIAL/PLATELET
BASO%: 0.3 % (ref 0.0–2.0)
BASOS ABS: 0 10*3/uL (ref 0.0–0.1)
EOS%: 0.7 % (ref 0.0–7.0)
Eosinophils Absolute: 0 10*3/uL (ref 0.0–0.5)
HCT: 35.8 % — ABNORMAL LOW (ref 38.4–49.9)
HEMOGLOBIN: 11.7 g/dL — AB (ref 13.0–17.1)
LYMPH%: 7.8 % — AB (ref 14.0–49.0)
MCH: 30.2 pg (ref 27.2–33.4)
MCHC: 32.7 g/dL (ref 32.0–36.0)
MCV: 92.5 fL (ref 79.3–98.0)
MONO#: 0.2 10*3/uL (ref 0.1–0.9)
MONO%: 6.2 % (ref 0.0–14.0)
NEUT#: 2.6 10*3/uL (ref 1.5–6.5)
NEUT%: 85 % — ABNORMAL HIGH (ref 39.0–75.0)
Platelets: 154 10*3/uL (ref 140–400)
RBC: 3.87 10*6/uL — AB (ref 4.20–5.82)
RDW: 13.1 % (ref 11.0–14.6)
WBC: 3.1 10*3/uL — ABNORMAL LOW (ref 4.0–10.3)
lymph#: 0.2 10*3/uL — ABNORMAL LOW (ref 0.9–3.3)

## 2016-10-19 ENCOUNTER — Ambulatory Visit
Admission: RE | Admit: 2016-10-19 | Discharge: 2016-10-19 | Disposition: A | Discharge status: Home / Self Care | Payer: Medicare Other | Source: Ambulatory Visit | Attending: Radiation Oncology | Admitting: Radiation Oncology

## 2016-10-19 DIAGNOSIS — Z510 Encounter for antineoplastic radiation therapy: Secondary | ICD-10-CM | POA: Diagnosis not present

## 2016-10-19 NOTE — Assessment & Plan Note (Signed)
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 No evidence of distal metastatic disease at this time based on the initial PET/CT. Patient was not a candidate for cisplatin-based chemotherapy due to pre-existing hearing loss requiring hearing aids.   Patient was started on systemic chemotherapy with combination of weekly carboplatin and paclitaxel for 10/01/16. The course has been complicated by oral thrush, pharyngeal pain requiring opioid medications, opioid-induced constipation so far. At the present time, constipation is under good control. Liquid morphine appears to be controlling pain well, and the patient did not tolerate transdermal fentanyl due to CNS side effects.  Clinical evaluation and lab work are permissive to proceed with the third week of systemic chemotherapy.  Plan: --proceed with the third dose of systemic therapy with carboplatin and paclitaxel --RTC 1 week: labs, clinic visit for continued chemoradiotherapy and toxicity monitoring

## 2016-10-19 NOTE — Progress Notes (Signed)
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 No evidence of distal metastatic disease at this time based on the initial PET/CT. Patient was not a candidate for cisplatin-based chemotherapy due to pre-existing hearing loss requiring hearing aids.   Patient was started on systemic chemotherapy with combination of weekly carboplatin and paclitaxel for 10/01/16. The course has been complicated by oral thrush, pharyngeal pain requiring opioid medications, opioid-induced constipation so far. At the present time, constipation is under good control. Liquid morphine appears to be controlling pain well, and the patient did not tolerate transdermal fentanyl due to CNS side effects.  Clinical evaluation and lab work are permissive to proceed with the third week of systemic chemotherapy.  Plan: --proceed with the third dose of systemic therapy with carboplatin and paclitaxel --RTC 1 week: labs, clinic visit for continued chemoradiotherapy and toxicity monitoring   Voice recognition software was used and creation of this note. Despite my best effort at editing the text, some misspelling/errors may have occurred.  Orders Placed This Encounter  Procedures  . CBC with Differential    Standing Status:   Future    Number of Occurrences:   1    Standing Expiration Date:   10/14/2017  . Comprehensive metabolic panel    Standing Status:   Future    Number of Occurrences:   1    Standing Expiration Date:   10/14/2017  . Magnesium    Standing Status:   Future    Number of Occurrences:   1    Standing Expiration Date:   10/14/2017    Cancer Staging Cancer of base of tongue ([place]) Staging form: Pharynx -  HPV-Mediated Oropharynx, AJCC 8th Edition - Clinical: Stage II (cT2, cN2, cM0, p16: Positive) - Signed by Eppie Gibson, MD on 09/14/2016   All questions were answered.  . The patient knows to call the clinic with any problems, questions or concerns.  This note was electronically signed.    History of Presenting Illness Joel Walton is a 71 y.o. 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 Patient is currently undergoing curative-intent treatment with chemoradiotherapy. He presents to the clinic for possible third week of carboplatin and paclitaxel therapy administered concurrently with radiation. In the interim, has been having significant problems with throat pain. He has required initiation of opioid medications. Due to rapid progression of the pain, he was started on transdermal fentanyl patch 25 g, but the patches cause significant amount of somnolence and confusion and patient has self discontinued that after the initial attempt.  Patient reports using tube, but still continuing to drink liquids by mouth. His nutrition is entirely administered by PEG tube at this point in time. Denies any interval fevers, chills or night sweats. No loss of sensation or minimal dexterity.  Oncological/hematological History:   Cancer of base of tongue ([place])   08/26/2016 Pathology Results    Tongue biopsy confirmed squamous cell carcinoma      08/27/2016 Initial Diagnosis    Cancer of base of tongue [facility]): positive for p16      09/01/2016 Imaging    CT Neck: Large necrotic an ulcerating lesion at the  right base of the tongue measuring 3.9 cm. Pathological lymphadenopathy in the right neck noted.      09/07/2016 Imaging    PET-CT: Large hypermetabolic mass at the right base of the tongue with extension across the midline. Significant hypermetabolic level II right-sided lymphadenopathy, possible pathological  lymphadenopathy on the left side at level II.      09/24/2016 Procedure    Ultrasound and fluoroscopically guided right internal jugular single lumen power port catheter insertion. Tip in the SVC/RA junction. Catheter ready for use      09/24/2016 Procedure    Fluoroscopic insertion of a 20-French "pull-through" gastrostomy.      09/28/2016 -  Radiation Therapy           10/01/2016 -  Chemotherapy    Carboplatin AUC2 QWk + Paclitaxel '45mg'$ /m2 QWk concurrent with radiation therapy --Wk #1, 10/01/16: --Wk #2, 10/08/16: --Wk #3, 10/14/16:        Medical History: Past Medical History:  Diagnosis Date  . Hyperlipidemia   . Hypertension     Surgical History: Past Surgical History:  Procedure Laterality Date  . ELBOW SURGERY Right   . IR FLUORO GUIDE PORT INSERTION RIGHT  09/24/2016  . IR GASTROSTOMY TUBE MOD SED  09/24/2016  . IR US GUIDE VASC ACCESS RIGHT  09/24/2016  . KNEE SURGERY Bilateral    Not total knee replacements  . SHOULDER ARTHROSCOPY Right     Family History: No family history on file.  Social History: Social History   Social History  . Marital status: Married    Spouse name: N/A  . Number of children: 2  . Years of education: N/A   Occupational History  . Retired      Personal assistant   Social History Main Topics  . Smoking status: Former Smoker    Packs/day: 0.50  . Smokeless tobacco: Never Used     Comment: Quit in 1978  . Alcohol use No     Comment: 3-4 beers daily  . Drug use: No  . Sexual activity: Not Currently   Other Topics Concern  . Not on file   Social History Narrative  . No narrative on file    Allergies: No Known Allergies  Medications:  Current Outpatient Prescriptions  Medication Sig Dispense Refill  . aspirin 81 MG chewable tablet Chew by mouth daily.    Marland Kitchen lidocaine (XYLOCAINE) 2 % solution Patient: Mix 1part 2% viscous lidocaine, 1part H20. Swish & swallow 4m of diluted mixture, 366m before meals and at  bedtime, up to QID 100 mL 5  . lidocaine-prilocaine (EMLA) cream Apply to affected area once 30 g 3  . LORazepam (ATIVAN) 0.5 MG tablet Take 1 tablet every 6 hours as needed for anxiety, prn. Up to QID, PRN. 80 tablet 0  . morphine (MSIR) 15 MG tablet Take 1 tablet (15 mg total) by mouth every 4 (four) hours as needed for severe pain. 30 tablet 0  . morphine (ROXANOL) 20 MG/ML concentrated solution Take 1 mL (20 mg total) by mouth every 2 (two) hours as needed for severe pain. 120 mL 0  . Multiple Vitamin (MULTIVITAMIN) tablet Take 1 tablet by mouth daily.    . Nutritional Supplements (FEEDING SUPPLEMENT, OSMOLITE 1.5 CAL,) LIQD Give 1.5 bottles Osmolite 1.5 QID with 60 mL free water before and after via PEG. Increase to 2 bottles once daily and continue 1.5 bottles TID as tolerated. Drink or flush PEG with additional 240 mL free water TID. Send supplies  and formula. Call daughter, [name], at [phone] to arrange delivery for patient. 1541 mL 0  . nystatin (MYCOSTATIN) 100000 UNIT/ML suspension Take 5 mLs (500,000 Units total) by mouth 4 (four) times daily. Swish and swallow if possible 473 mL 0  . omeprazole (PRILOSEC) 20 MG capsule   0  . ondansetron (ZOFRAN) 8 MG tablet Take 1 tablet (8 mg total) by mouth 2 (two) times daily as needed for refractory nausea / vomiting. Start on day 3 after chemo. 30 tablet 1  . predniSONE (DELTASONE) 20 MG tablet 3 tabs poqday 1-2, 2 tabs poqday 3-4, 1 tab poqday 5-6 12 tablet 0  . prochlorperazine (COMPAZINE) 10 MG tablet Take 1 tablet (10 mg total) by mouth every 6 (six) hours as needed (Nausea or vomiting). 30 tablet 1   No current facility-administered medications for this visit.     Review of Systems: Review of Systems  All other systems reviewed and are negative.    PHYSICAL EXAMINATION Blood pressure 97/62, pulse 94, temperature 99.6 F (37.6 C), temperature source Oral, resp. rate 18, height '5\' 9"'$  (1.753 m), weight 167 lb 14.4 oz (76.2 kg), SpO2  99 %.  ECOG PERFORMANCE STATUS: 1 - Symptomatic but completely ambulatory  Physical Exam  Constitutional: He is oriented to person, place, and time and well-developed, well-nourished, and in no distress. No distress.  HENT:  Mouth/Throat: Oropharynx is clear and moist and mucous membranes are normal. No dental abscesses.  Edentulous, wearing dentures. No visible lesion noted no anterior oral ulcerations, leukoplakia. Visible oropharynx without abnormality.  Eyes: Pupils are equal, round, and reactive to light. Conjunctivae and EOM are normal. No scleral icterus.  Neck:  Palpable enlarged and hard lymph nodes in the right neck. He did not appear to be too other soft tissues.  Cardiovascular: Normal rate and regular rhythm.   No murmur heard. Pulmonary/Chest: Effort normal and breath sounds normal. No respiratory distress. He has no wheezes. He has no rales.  Abdominal: Soft. Bowel sounds are normal. He exhibits no distension and no mass. There is no tenderness.  Musculoskeletal: Normal range of motion. He exhibits no edema.  Neurological: He is alert and oriented to person, place, and time. He has normal reflexes. No cranial nerve deficit. Coordination normal.  Skin: Skin is warm and dry. He is not diaphoretic. There is erythema.  Into starting to develop erythema over bilateral upper neck consistent with radiation changes     LABORATORY DATA: I have personally reviewed the data as listed: Appointment on 10/14/2016  Component Date Value Ref Range Status  . WBC 10/14/2016 2.6* 4.0 - 10.3 10e3/uL Final  . NEUT# 10/14/2016 2.1  1.5 - 6.5 10e3/uL Final  . HGB 10/14/2016 12.3* 13.0 - 17.1 g/dL Final  . HCT 10/14/2016 37.2* 38.4 - 49.9 % Final  . Platelets 10/14/2016 188  140 - 400 10e3/uL Final  . MCV 10/14/2016 92.7  79.3 - 98.0 fL Final  . MCH 10/14/2016 30.6  27.2 - 33.4 pg Final  . MCHC 10/14/2016 33.0  32.0 - 36.0 g/dL Final  . RBC 10/14/2016 4.02* 4.20 - 5.82 10e6/uL Final  . RDW  10/14/2016 12.9  11.0 - 14.6 % Final  . lymph# 10/14/2016 0.2* 0.9 - 3.3 10e3/uL Final  . MONO# 10/14/2016 0.2  0.1 - 0.9 10e3/uL Final  . Eosinophils Absolute 10/14/2016 0.0  0.0 - 0.5 10e3/uL Final  . Basophils Absolute 10/14/2016 0.0  0.0 - 0.1 10e3/uL Final  . NEUT% 10/14/2016 80.0* 39.0 - 75.0 %  Final  . LYMPH% 10/14/2016 8.6* 14.0 - 49.0 % Final  . MONO% 10/14/2016 9.5  0.0 - 14.0 % Final  . EOS% 10/14/2016 1.5  0.0 - 7.0 % Final  . BASO% 10/14/2016 0.4  0.0 - 2.0 % Final  . Sodium 10/14/2016 136  136 - 145 mEq/L Final  . Potassium 10/14/2016 4.6  3.5 - 5.1 mEq/L Final  . Chloride 10/14/2016 96* 98 - 109 mEq/L Final  . CO2 10/14/2016 31* 22 - 29 mEq/L Final  . Glucose 10/14/2016 102  70 - 140 mg/dl Final   Glucose reference range is for nonfasting patients. Fasting glucose reference range is 70- 100.  Marland Kitchen BUN 10/14/2016 11.8  7.0 - 26.0 mg/dL Final  . Creatinine 10/14/2016 0.7  0.7 - 1.3 mg/dL Final  . Total Bilirubin 10/14/2016 0.42  0.20 - 1.20 mg/dL Final  . Alkaline Phosphatase 10/14/2016 83  40 - 150 U/L Final  . AST 10/14/2016 23  5 - 34 U/L Final  . ALT 10/14/2016 84* 0 - 55 U/L Final  . Total Protein 10/14/2016 6.5  6.4 - 8.3 g/dL Final  . Albumin 10/14/2016 2.9* 3.5 - 5.0 g/dL Final  . Calcium 10/14/2016 9.2  8.4 - 10.4 mg/dL Final  . Anion Gap 10/14/2016 9  3 - 11 mEq/L Final  . EGFR 10/14/2016 >90  >90 ml/min/1.73 m2 Final   eGFR is calculated using the CKD-EPI Creatinine Equation (2009)  . Magnesium 10/14/2016 2.0  1.5 - 2.5 mg/dl Final       Ardath Sax, MD

## 2016-10-20 ENCOUNTER — Other Ambulatory Visit: Payer: Self-pay | Admitting: Hematology and Oncology

## 2016-10-20 ENCOUNTER — Telehealth: Payer: Self-pay | Admitting: *Deleted

## 2016-10-20 ENCOUNTER — Ambulatory Visit
Admission: RE | Admit: 2016-10-20 | Discharge: 2016-10-20 | Disposition: A | Discharge status: Home / Self Care | Payer: Medicare Other | Source: Ambulatory Visit | Attending: Radiation Oncology | Admitting: Radiation Oncology

## 2016-10-20 DIAGNOSIS — Z510 Encounter for antineoplastic radiation therapy: Secondary | ICD-10-CM | POA: Diagnosis not present

## 2016-10-20 DIAGNOSIS — C01 Malignant neoplasm of base of tongue: Secondary | ICD-10-CM

## 2016-10-20 DIAGNOSIS — E441 Mild protein-calorie malnutrition: Secondary | ICD-10-CM

## 2016-10-20 MED ORDER — MORPHINE SULFATE 15 MG PO TABS: 15.0000 mg | ORAL_TABLET | ORAL | 0 refills | Status: DC | PRN

## 2016-10-20 NOTE — Telephone Encounter (Signed)
Oncology Nurse Navigator Documentation  Received call from patient's dtr indicating he needs refill Rx for morphine (MSIR) 15 mg tablet.  Dr. Lebron Conners and his RN notified.  Gayleen Orem, RN, BSN, [place] Oncology Nurse [place] at [place] [phone]

## 2016-10-21 ENCOUNTER — Ambulatory Visit
Admission: RE | Admit: 2016-10-21 | Discharge: 2016-10-21 | Disposition: A | Discharge status: Home / Self Care | Payer: Medicare Other | Source: Ambulatory Visit | Attending: Radiation Oncology | Admitting: Radiation Oncology

## 2016-10-21 DIAGNOSIS — Z510 Encounter for antineoplastic radiation therapy: Secondary | ICD-10-CM | POA: Diagnosis not present

## 2016-10-22 ENCOUNTER — Ambulatory Visit (HOSPITAL_BASED_OUTPATIENT_CLINIC_OR_DEPARTMENT_OTHER): Payer: Medicare Other | Admitting: Hematology and Oncology

## 2016-10-22 ENCOUNTER — Ambulatory Visit (HOSPITAL_BASED_OUTPATIENT_CLINIC_OR_DEPARTMENT_OTHER): Payer: Medicare Other

## 2016-10-22 ENCOUNTER — Ambulatory Visit: Payer: Medicare Other | Admitting: Nutrition

## 2016-10-22 ENCOUNTER — Ambulatory Visit
Admission: RE | Admit: 2016-10-22 | Discharge: 2016-10-22 | Disposition: A | Discharge status: Home / Self Care | Payer: Medicare Other | Source: Ambulatory Visit | Attending: Radiation Oncology | Admitting: Radiation Oncology

## 2016-10-22 ENCOUNTER — Encounter: Payer: Self-pay | Admitting: *Deleted

## 2016-10-22 DIAGNOSIS — Z510 Encounter for antineoplastic radiation therapy: Secondary | ICD-10-CM | POA: Diagnosis not present

## 2016-10-22 DIAGNOSIS — C01 Malignant neoplasm of base of tongue: Secondary | ICD-10-CM | POA: Diagnosis not present

## 2016-10-22 DIAGNOSIS — Z5111 Encounter for antineoplastic chemotherapy: Secondary | ICD-10-CM

## 2016-10-22 DIAGNOSIS — C770 Secondary and unspecified malignant neoplasm of lymph nodes of head, face and neck: Secondary | ICD-10-CM

## 2016-10-22 MED ORDER — DIPHENHYDRAMINE HCL 50 MG/ML IJ SOLN
INTRAMUSCULAR | Status: AC
  Filled 2016-10-22: qty 1

## 2016-10-22 MED ORDER — SODIUM CHLORIDE 0.9 % IV SOLN
Freq: Once | INTRAVENOUS | Status: AC
  Administered 2016-10-22: 13:00:00 via INTRAVENOUS

## 2016-10-22 MED ORDER — SODIUM CHLORIDE 0.9 % IV SOLN
210.0000 mg | Freq: Once | INTRAVENOUS | Status: AC
  Administered 2016-10-22: 210 mg via INTRAVENOUS
  Filled 2016-10-22: qty 21

## 2016-10-22 MED ORDER — PALONOSETRON HCL INJECTION 0.25 MG/5ML
0.2500 mg | Freq: Once | INTRAVENOUS | Status: AC
  Administered 2016-10-22: 0.25 mg via INTRAVENOUS

## 2016-10-22 MED ORDER — PACLITAXEL CHEMO INJECTION 300 MG/50ML
45.0000 mg/m2 | Freq: Once | INTRAVENOUS | Status: AC
  Administered 2016-10-22: 90 mg via INTRAVENOUS
  Filled 2016-10-22: qty 15

## 2016-10-22 MED ORDER — HEPARIN SOD (PORK) LOCK FLUSH 100 UNIT/ML IV SOLN
500.0000 [IU] | Freq: Once | INTRAVENOUS | Status: AC | PRN
  Administered 2016-10-22: 500 [IU]
  Filled 2016-10-22: qty 5

## 2016-10-22 MED ORDER — DIPHENHYDRAMINE HCL 50 MG/ML IJ SOLN
25.0000 mg | Freq: Once | INTRAMUSCULAR | Status: AC
  Administered 2016-10-22: 25 mg via INTRAVENOUS

## 2016-10-22 MED ORDER — FAMOTIDINE IN NACL 20-0.9 MG/50ML-% IV SOLN
20.0000 mg | Freq: Once | INTRAVENOUS | Status: AC
  Administered 2016-10-22: 20 mg via INTRAVENOUS

## 2016-10-22 MED ORDER — SODIUM CHLORIDE 0.9% FLUSH
10.0000 mL | INTRAVENOUS | Status: DC | PRN
  Administered 2016-10-22: 10 mL
  Filled 2016-10-22: qty 10

## 2016-10-22 MED ORDER — LORAZEPAM 1 MG PO TABS: 1.0000 mg | ORAL_TABLET | Freq: Four times a day (QID) | ORAL | 0 refills | Status: DC | PRN

## 2016-10-22 MED ORDER — FAMOTIDINE IN NACL 20-0.9 MG/50ML-% IV SOLN
INTRAVENOUS | Status: AC
  Filled 2016-10-22: qty 50

## 2016-10-22 MED ORDER — SODIUM CHLORIDE 0.9 % IV SOLN
20.0000 mg | Freq: Once | INTRAVENOUS | Status: AC
  Administered 2016-10-22: 20 mg via INTRAVENOUS
  Filled 2016-10-22: qty 2

## 2016-10-22 MED ORDER — PALONOSETRON HCL INJECTION 0.25 MG/5ML
INTRAVENOUS | Status: AC
  Filled 2016-10-22: qty 5

## 2016-10-22 NOTE — Progress Notes (Signed)
Nutrition follow-up completed with patient who receives treatment for tonsil cancer. Patient states he is tolerating Osmolite 1.5, 6 bottles daily. He continues to flush his feeding tube with water. He is having a daily stool. He reports vomiting after used baking soda/saltwater rinses. States he will try just saltwater. Weight stable and documented as 167.5 pounds September 14. Patient states he is able to drink water by mouth.  Nutrition diagnosis: Inadequate oral intake continues.  Estimated nutrition needs: 2200-2400 calories, 90-110 grams protein, 2.4 L fluid.  Intervention: Patient educated to continue strategies for 2 bottles Osmolite 1.5, 3 times a day with 60 mL free water before and after bolus feeding. 6 bottles Osmolite 1.5 provides 2130 cal, 89.4 g protein, 1086 mL free water Patient will consider adding one half bottle daily to promote weight gain. Will continue to monitor weight tolerance of tube feeding. Teach back method used.  Monitoring, evaluation, goals: Patient will tolerate tube feeding at goal rate to minimize weight loss.  Next visit: Thursday, September 20 after radiation therapy.  **Disclaimer: This note was dictated with voice recognition software. Similar sounding words can inadvertently be transcribed and this note may contain transcription errors which may not have been corrected upon publication of note.**

## 2016-10-22 NOTE — Progress Notes (Signed)
[place] Work  Clinical Social Work was referred by radiation oncologist to follow up regarding patient's anxiety. Clinical Social Worker met with patient in infusion room to offer support and assess for needs.  Joel Walton reported his anxiety stemmed from feeling claustrophobic when wearing mask for radiation.  He stated he begins to feel anxiety several hours before his treatment daily, but feels the ativan prescribed has already started alleviating his anxiety.  CSW discussed other options for managing anxiety, patient no interested at this time but agreed to follow up with CSW if he continues to feel anxious or for more extended periods of time.  Maryjean Morn, MSW, LCSW, OSW-C Clinical Social Worker [facility] [phone]

## 2016-10-22 NOTE — Progress Notes (Signed)
Per Dr Irene Limbo - use labs obtained on 10/18/2016 for chemo treatment today.

## 2016-10-25 ENCOUNTER — Ambulatory Visit
Admission: RE | Admit: 2016-10-25 | Discharge: 2016-10-25 | Disposition: A | Discharge status: Home / Self Care | Payer: Medicare Other | Source: Ambulatory Visit | Attending: Radiation Oncology | Admitting: Radiation Oncology

## 2016-10-25 ENCOUNTER — Other Ambulatory Visit (HOSPITAL_BASED_OUTPATIENT_CLINIC_OR_DEPARTMENT_OTHER): Payer: Medicare Other

## 2016-10-25 ENCOUNTER — Encounter: Payer: Self-pay | Admitting: Radiation Oncology

## 2016-10-25 ENCOUNTER — Telehealth: Payer: Self-pay | Admitting: Hematology and Oncology

## 2016-10-25 DIAGNOSIS — C01 Malignant neoplasm of base of tongue: Secondary | ICD-10-CM

## 2016-10-25 DIAGNOSIS — Z510 Encounter for antineoplastic radiation therapy: Secondary | ICD-10-CM | POA: Diagnosis not present

## 2016-10-25 DIAGNOSIS — C770 Secondary and unspecified malignant neoplasm of lymph nodes of head, face and neck: Secondary | ICD-10-CM

## 2016-10-25 LAB — CBC WITH DIFFERENTIAL/PLATELET
BASO%: 0.7 % (ref 0.0–2.0)
Basophils Absolute: 0 10*3/uL (ref 0.0–0.1)
EOS%: 0.4 % (ref 0.0–7.0)
Eosinophils Absolute: 0 10*3/uL (ref 0.0–0.5)
HEMATOCRIT: 34.2 % — AB (ref 38.4–49.9)
HGB: 11.5 g/dL — ABNORMAL LOW (ref 13.0–17.1)
LYMPH#: 0.1 10*3/uL — AB (ref 0.9–3.3)
LYMPH%: 7.7 % — ABNORMAL LOW (ref 14.0–49.0)
MCH: 30.6 pg (ref 27.2–33.4)
MCHC: 33.5 g/dL (ref 32.0–36.0)
MCV: 91.1 fL (ref 79.3–98.0)
MONO#: 0.2 10*3/uL (ref 0.1–0.9)
MONO%: 10 % (ref 0.0–14.0)
NEUT#: 1.4 10*3/uL — ABNORMAL LOW (ref 1.5–6.5)
NEUT%: 81.2 % — AB (ref 39.0–75.0)
Platelets: 134 10*3/uL — ABNORMAL LOW (ref 140–400)
RBC: 3.75 10*6/uL — AB (ref 4.20–5.82)
RDW: 13.6 % (ref 11.0–14.6)
WBC: 1.8 10*3/uL — ABNORMAL LOW (ref 4.0–10.3)

## 2016-10-25 LAB — COMPREHENSIVE METABOLIC PANEL
ALT: 63 U/L — AB (ref 0–55)
AST: 23 U/L (ref 5–34)
Albumin: 2.9 g/dL — ABNORMAL LOW (ref 3.5–5.0)
Alkaline Phosphatase: 95 U/L (ref 40–150)
Anion Gap: 8 mEq/L (ref 3–11)
BUN: 12.4 mg/dL (ref 7.0–26.0)
CHLORIDE: 96 meq/L — AB (ref 98–109)
CO2: 31 meq/L — AB (ref 22–29)
CREATININE: 0.6 mg/dL — AB (ref 0.7–1.3)
Calcium: 9 mg/dL (ref 8.4–10.4)
EGFR: >90 mL/min/{1.73_m2} (ref >90)
GLUCOSE: 143 mg/dL — AB (ref 70–140)
Potassium: 4.3 mEq/L (ref 3.5–5.1)
SODIUM: 135 meq/L — AB (ref 136–145)
Total Bilirubin: 0.44 mg/dL (ref 0.20–1.20)
Total Protein: 6.5 g/dL (ref 6.4–8.3)

## 2016-10-25 NOTE — Telephone Encounter (Signed)
Scheduled appt per 9/14 los - left message with appt date and time.

## 2016-10-26 ENCOUNTER — Ambulatory Visit
Admission: RE | Admit: 2016-10-26 | Discharge: 2016-10-26 | Disposition: A | Discharge status: Home / Self Care | Payer: Medicare Other | Source: Ambulatory Visit | Attending: Radiation Oncology | Admitting: Radiation Oncology

## 2016-10-26 ENCOUNTER — Encounter: Payer: Self-pay | Admitting: *Deleted

## 2016-10-26 DIAGNOSIS — Z510 Encounter for antineoplastic radiation therapy: Secondary | ICD-10-CM | POA: Diagnosis not present

## 2016-10-26 NOTE — Progress Notes (Signed)
Oncology Nurse Navigator Documentation  Met with Mr. Hinz in Infusion where he was receiving  third dose of carboplatin and paclitaxel. He stated current Ativan regime helping with RT anxiety. He reported tolerance of current chemo tmt, denied needs/concerns. He voiced understanding I can be contacted as needed.  Gayleen Orem, RN, BSN, [place] [facility] Oncology Nurse [place] at [place] [phone]

## 2016-10-27 ENCOUNTER — Ambulatory Visit
Admission: RE | Admit: 2016-10-27 | Discharge: 2016-10-27 | Disposition: A | Discharge status: Home / Self Care | Payer: Medicare Other | Source: Ambulatory Visit | Attending: Radiation Oncology | Admitting: Radiation Oncology

## 2016-10-27 ENCOUNTER — Encounter: Payer: Self-pay | Admitting: Hematology and Oncology

## 2016-10-27 DIAGNOSIS — Z510 Encounter for antineoplastic radiation therapy: Secondary | ICD-10-CM | POA: Diagnosis not present

## 2016-10-27 NOTE — Progress Notes (Signed)
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 No evidence of distal metastatic disease at this time based on the initial PET/CT. Patient was not a candidate for cisplatin-based chemotherapy due to pre-existing hearing loss requiring hearing aids.   Patient was started on systemic chemotherapy with combination of weekly carboplatin and paclitaxel for 10/01/16. The course has been complicated by oral thrush, pharyngeal pain requiring opioid medications, opioid-induced constipation so far. At the present time, constipation is under good control. Liquid morphine appears to be controlling pain well, and the patient did not tolerate transdermal fentanyl due to CNS side effects.  Clinical evaluation and lab work are permissive to proceed with the third week of systemic chemotherapy.  Plan: --Increase Lorazepam to '1mg'$  PO Q6hrs PRN --proceed with the 4th dose of systemic therapy with carboplatin and paclitaxel --RTC 1 week: labs, clinic visit for continued chemoradiotherapy and toxicity monitoring   Voice recognition software was used and creation of this note. Despite my best effort at editing the text, some misspelling/errors may have occurred.  No orders of the defined types were placed in this encounter.   Cancer Staging Cancer of base of tongue ([place]) Staging form: Pharynx - HPV-Mediated Oropharynx, AJCC 8th Edition - Clinical: Stage II (cT2, cN2, cM0, p16: Positive) - Signed by Eppie Gibson, MD on 09/14/2016   All questions were answered.  . The patient knows to call the clinic with any problems, questions or concerns.  This note was electronically signed.    History of  Presenting Illness Joel Walton is a 71 y.o. 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 Patient is currently undergoing curative-intent treatment with chemoradiotherapy. He presents to the clinic for possible 4th week of carboplatin and paclitaxel therapy administered concurrently with radiation. The present time, patient is requiring oral opioid medications for pain control due to radiation-induced mucositis as well as lorazepam for control of anxiety. Pain control is adequate, anxiety is somewhat still out of control on the current dosing of medication. Patient reports using PEG tube, but still continuing to drink liquids by mouth. His nutrition is entirely administered by PEG tube at this point in time. Denies any interval fevers, chills or night sweats. No loss of sensation or minimal dexterity.  Oncological/hematological History:   Cancer of base of tongue ([place])   08/26/2016 Pathology Results    Tongue biopsy confirmed squamous cell carcinoma      08/27/2016 Initial Diagnosis    Cancer of base of tongue ([place]): positive for p16      09/01/2016 Imaging    CT Neck: Large necrotic an ulcerating lesion at the right base of the tongue measuring 3.9 cm. Pathological lymphadenopathy in the right neck noted.      09/07/2016 Imaging    PET-CT: Large hypermetabolic mass at the right base of the tongue with extension across the midline. Significant hypermetabolic level II right-sided lymphadenopathy, possible pathological lymphadenopathy on the left side at level II.      09/24/2016 Procedure    Ultrasound and fluoroscopically guided right internal jugular single lumen power port catheter insertion. Tip in the SVC/RA junction. Catheter ready for use  09/24/2016 Procedure    Fluoroscopic insertion of a 20-French "pull-through" gastrostomy.      09/28/2016 -  Radiation Therapy           10/01/2016 -   Chemotherapy    Carboplatin AUC2 QWk + Paclitaxel '45mg'$ /m2 QWk concurrent with radiation therapy --Wk #1, 10/01/16: --Wk #2, 10/08/16: --Wk #3, 10/14/16:        Medical History: Past Medical History:  Diagnosis Date  . Hyperlipidemia   . Hypertension     Surgical History: Past Surgical History:  Procedure Laterality Date  . ELBOW SURGERY Right   . IR FLUORO GUIDE PORT INSERTION RIGHT  09/24/2016  . IR GASTROSTOMY TUBE MOD SED  09/24/2016  . IR US GUIDE VASC ACCESS RIGHT  09/24/2016  . KNEE SURGERY Bilateral    Not total knee replacements  . SHOULDER ARTHROSCOPY Right     Family History: No family history on file.  Social History: Social History   Social History  . Marital status: Married    Spouse name: N/A  . Number of children: 2  . Years of education: N/A   Occupational History  . Retired      Personal assistant   Social History Main Topics  . Smoking status: Former Smoker    Packs/day: 0.50  . Smokeless tobacco: Never Used     Comment: Quit in 1978  . Alcohol use No     Comment: 3-4 beers daily  . Drug use: No  . Sexual activity: Not Currently   Other Topics Concern  . Not on file   Social History Narrative  . No narrative on file    Allergies: No Known Allergies  Medications:  Current Outpatient Prescriptions  Medication Sig Dispense Refill  . aspirin 81 MG chewable tablet Chew by mouth daily.    Marland Kitchen lidocaine (XYLOCAINE) 2 % solution Patient: Mix 1part 2% viscous lidocaine, 1part H20. Swish & swallow 23m of diluted mixture, 328m before meals and at bedtime, up to QID 100 mL 5  . lidocaine-prilocaine (EMLA) cream Apply to affected area once 30 g 3  . LORazepam (ATIVAN) 1 MG tablet Take 1 tablet (1 mg total) by mouth every 6 (six) hours as needed for anxiety. Take 1 tablet every 6 hours as needed for anxiety, prn. Up to QID, PRN. 50 tablet 0  . morphine (MSIR) 15 MG tablet Take 1 tablet (15 mg total) by mouth every 4 (four) hours as needed for  severe pain. 30 tablet 0  . morphine (ROXANOL) 20 MG/ML concentrated solution Take 1 mL (20 mg total) by mouth every 2 (two) hours as needed for severe pain. 120 mL 0  . Multiple Vitamin (MULTIVITAMIN) tablet Take 1 tablet by mouth daily.    . Nutritional Supplements (FEEDING SUPPLEMENT, OSMOLITE 1.5 CAL,) LIQD Give 1.5 bottles Osmolite 1.5 QID with 60 mL free water before and after via PEG. Increase to 2 bottles once daily and continue 1.5 bottles TID as tolerated. Drink or flush PEG with additional 240 mL free water TID. Send supplies and formula. Call daughter, JaRoselyn Reefat [phone] arrange delivery for patient. 1541 mL 0  . nystatin (MYCOSTATIN) 100000 UNIT/ML suspension Take 5 mLs (500,000 Units total) by mouth 4 (four) times daily. Swish and swallow if possible 473 mL 0  . omeprazole (PRILOSEC) 20 MG capsule   0  . ondansetron (ZOFRAN) 8 MG tablet Take 1 tablet (8 mg total) by mouth 2 (two) times daily as needed for refractory nausea / vomiting.  Start on day 3 after chemo. 30 tablet 1  . predniSONE (DELTASONE) 20 MG tablet 3 tabs poqday 1-2, 2 tabs poqday 3-4, 1 tab poqday 5-6 12 tablet 0  . prochlorperazine (COMPAZINE) 10 MG tablet Take 1 tablet (10 mg total) by mouth every 6 (six) hours as needed (Nausea or vomiting). 30 tablet 1   No current facility-administered medications for this visit.     Review of Systems: Review of Systems  All other systems reviewed and are negative.    PHYSICAL EXAMINATION Blood pressure 120/76, pulse 91, temperature 99.3 F (37.4 C), temperature source Oral, resp. rate 18, height '5\' 9"'$  (1.753 m), weight 167 lb 8 oz (76 kg), SpO2 100 %.  ECOG PERFORMANCE STATUS: 1 - Symptomatic but completely ambulatory  Physical Exam  Constitutional: He is oriented to person, place, and time and well-developed, well-nourished, and in no distress. No distress.  HENT:  Mouth/Throat: Oropharynx is clear and moist and mucous membranes are normal. No dental abscesses.   Edentulous, wearing dentures. No visible lesion noted no anterior oral ulcerations, leukoplakia. Visible oropharynx without abnormality.  Eyes: Pupils are equal, round, and reactive to light. Conjunctivae and EOM are normal. No scleral icterus.  Neck:  Palpable enlarged and hard lymph nodes in the right neck. He did not appear to be too other soft tissues.  Cardiovascular: Normal rate and regular rhythm.   No murmur heard. Pulmonary/Chest: Effort normal and breath sounds normal. No respiratory distress. He has no wheezes. He has no rales.  Abdominal: Soft. Bowel sounds are normal. He exhibits no distension and no mass. There is no tenderness.  Musculoskeletal: Normal range of motion. He exhibits no edema.  Neurological: He is alert and oriented to person, place, and time. He has normal reflexes. No cranial nerve deficit. Coordination normal.  Skin: Skin is warm and dry. He is not diaphoretic. There is erythema.  Into starting to develop erythema over bilateral upper neck consistent with radiation changes     LABORATORY DATA: I have personally reviewed the data as listed: Appointment on 10/18/2016  Component Date Value Ref Range Status  . WBC 10/18/2016 3.1* 4.0 - 10.3 10e3/uL Final  . NEUT# 10/18/2016 2.6  1.5 - 6.5 10e3/uL Final  . HGB 10/18/2016 11.7* 13.0 - 17.1 g/dL Final  . HCT 10/18/2016 35.8* 38.4 - 49.9 % Final  . Platelets 10/18/2016 154  140 - 400 10e3/uL Final  . MCV 10/18/2016 92.5  79.3 - 98.0 fL Final  . MCH 10/18/2016 30.2  27.2 - 33.4 pg Final  . MCHC 10/18/2016 32.7  32.0 - 36.0 g/dL Final  . RBC 10/18/2016 3.87* 4.20 - 5.82 10e6/uL Final  . RDW 10/18/2016 13.1  11.0 - 14.6 % Final  . lymph# 10/18/2016 0.2* 0.9 - 3.3 10e3/uL Final  . MONO# 10/18/2016 0.2  0.1 - 0.9 10e3/uL Final  . Eosinophils Absolute 10/18/2016 0.0  0.0 - 0.5 10e3/uL Final  . Basophils Absolute 10/18/2016 0.0  0.0 - 0.1 10e3/uL Final  . NEUT% 10/18/2016 85.0* 39.0 - 75.0 % Final  . LYMPH%  10/18/2016 7.8* 14.0 - 49.0 % Final  . MONO% 10/18/2016 6.2  0.0 - 14.0 % Final  . EOS% 10/18/2016 0.7  0.0 - 7.0 % Final  . BASO% 10/18/2016 0.3  0.0 - 2.0 % Final  . Sodium 10/18/2016 136  136 - 145 mEq/L Final  . Potassium 10/18/2016 4.1  3.5 - 5.1 mEq/L Final  . Chloride 10/18/2016 97* 98 - 109 mEq/L Final  .  CO2 10/18/2016 30* 22 - 29 mEq/L Final  . Glucose 10/18/2016 115  70 - 140 mg/dl Final   Glucose reference range is for nonfasting patients. Fasting glucose reference range is 70- 100.  Marland Kitchen BUN 10/18/2016 12.7  7.0 - 26.0 mg/dL Final  . Creatinine 10/18/2016 0.6* 0.7 - 1.3 mg/dL Final  . Total Bilirubin 10/18/2016 0.41  0.20 - 1.20 mg/dL Final  . Alkaline Phosphatase 10/18/2016 90  40 - 150 U/L Final  . AST 10/18/2016 23  5 - 34 U/L Final  . ALT 10/18/2016 77* 0 - 55 U/L Final  . Total Protein 10/18/2016 6.6  6.4 - 8.3 g/dL Final  . Albumin 10/18/2016 2.9* 3.5 - 5.0 g/dL Final  . Calcium 10/18/2016 9.1  8.4 - 10.4 mg/dL Final  . Anion Gap 10/18/2016 8  3 - 11 mEq/L Final  . EGFR 10/18/2016 >90  >90 ml/min/1.73 m2 Final   eGFR is calculated using the CKD-EPI Creatinine Equation (2009)       Ardath Sax, MD

## 2016-10-27 NOTE — Assessment & Plan Note (Signed)
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 No evidence of distal metastatic disease at this time based on the initial PET/CT. Patient was not a candidate for cisplatin-based chemotherapy due to pre-existing hearing loss requiring hearing aids.   Patient was started on systemic chemotherapy with combination of weekly carboplatin and paclitaxel for 10/01/16. The course has been complicated by oral thrush, pharyngeal pain requiring opioid medications, opioid-induced constipation so far. At the present time, constipation is under good control. Liquid morphine appears to be controlling pain well, and the patient did not tolerate transdermal fentanyl due to CNS side effects.  Clinical evaluation and lab work are permissive to proceed with the third week of systemic chemotherapy.  Plan: --Increase Lorazepam to 1mg  PO Q6hrs PRN --proceed with the 4th dose of systemic therapy with carboplatin and paclitaxel --RTC 1 week: labs, clinic visit for continued chemoradiotherapy and toxicity monitoring

## 2016-10-28 ENCOUNTER — Encounter: Payer: Self-pay | Admitting: *Deleted

## 2016-10-28 ENCOUNTER — Ambulatory Visit (HOSPITAL_BASED_OUTPATIENT_CLINIC_OR_DEPARTMENT_OTHER): Payer: Medicare Other | Admitting: Hematology and Oncology

## 2016-10-28 ENCOUNTER — Ambulatory Visit
Admission: RE | Admit: 2016-10-28 | Discharge: 2016-10-28 | Disposition: A | Discharge status: Home / Self Care | Payer: Medicare Other | Source: Ambulatory Visit | Attending: Radiation Oncology | Admitting: Radiation Oncology

## 2016-10-28 ENCOUNTER — Ambulatory Visit: Payer: Medicare Other | Admitting: Nutrition

## 2016-10-28 VITALS — BP 112/82 | HR 95 | Temp 99.3°F | Resp 18 | Ht 69.0 in | Wt 167.7 lb

## 2016-10-28 DIAGNOSIS — C01 Malignant neoplasm of base of tongue: Secondary | ICD-10-CM

## 2016-10-28 DIAGNOSIS — Z510 Encounter for antineoplastic radiation therapy: Secondary | ICD-10-CM | POA: Diagnosis not present

## 2016-10-28 DIAGNOSIS — C770 Secondary and unspecified malignant neoplasm of lymph nodes of head, face and neck: Secondary | ICD-10-CM

## 2016-10-28 DIAGNOSIS — G893 Neoplasm related pain (acute) (chronic): Secondary | ICD-10-CM

## 2016-10-28 DIAGNOSIS — Z5111 Encounter for antineoplastic chemotherapy: Secondary | ICD-10-CM

## 2016-10-28 MED ORDER — DIPHENHYDRAMINE HCL 12.5 MG/5ML PO SYRP: 6.2500 mg | ORAL_SOLUTION | Freq: Every evening | ORAL | 0 refills | Status: DC | PRN

## 2016-10-28 MED ORDER — MORPHINE SULFATE (CONCENTRATE) 20 MG/ML PO SOLN: 20.0000 mg | ORAL | 0 refills | Status: AC | PRN

## 2016-10-28 MED ORDER — FLUCONAZOLE 10 MG/ML PO SUSR: 100.0000 mg | Freq: Every day | ORAL | 0 refills | Status: AC

## 2016-10-28 MED ORDER — LORAZEPAM 2 MG/ML PO CONC: 1.0000 mg | ORAL | 0 refills | Status: DC | PRN

## 2016-10-28 MED FILL — MORPHINE SULF 100 MG/5 ML S: 100 | 12 days supply | Qty: 120 | Fill #0

## 2016-10-28 MED FILL — LORazepam 2 MG/ML CONC: 2 | 10 days supply | Qty: 30 | Fill #0

## 2016-10-28 NOTE — Progress Notes (Signed)
Nutrition Follow-up:  Nutrition follow-up completed with patient who receives treatment for tongue cancer. Spoke with patient and patient's daughter. Patient states he is tolerating Osmolite 1.5, 6 bottles daily. Patient states he is willing to add another half bottle of formula as long as he can do so before bedtime and it does not feel "bloated". Patient states he is able to drink water by mouth (2-3 bottles/day) and eats 1 scoop of ice cream up to 2 times per day. Patient also continues to flush his feeding tube with water.  Patient not willing to do saltwater mouth rinse at this time because the last attempt made him vomit (early last week).  Pt reports no nutrition impact symptoms at this time. Reports having a bowel movement daily and if constipation arises is controlled via MiraLAX.  Medications: Prednisone, Compazine, Zofran  Labs: reviewed  Anthropometrics:  Pt maintained a stable weight of ~167 lbs since last two RD visits documented September 14 and September 7.   Estimated Energy Needs  Kcals: 2200-2400 Protein: 90-110 grams Fluid: 2.4 L/d  NUTRITION DIAGNOSIS: Inadequate oral intake continues  INTERVENTION:  Discussed patient's tube feeding regimen.  6 bottles Osmolite 1.5 provides 2130 kcal, 89.4 grams protein and 1088 mL free water. Promoted adding one half bottle daily to promote weight gain.  Encouraged fluid intake by mouth and tube.  RD to continue to monitor for tolerance and weight trends  MONITORING, EVALUATION, GOAL:  Patient will tolerate tube feeding at goal rate to minimize weight loss   NEXT VISIT: To be scheduled with treatments  Parks Ranger, MS, RDN, LDN 10/28/2016 4:47 PM

## 2016-10-29 ENCOUNTER — Ambulatory Visit
Admission: RE | Admit: 2016-10-29 | Discharge: 2016-10-29 | Disposition: A | Discharge status: Home / Self Care | Payer: Medicare Other | Source: Ambulatory Visit | Attending: Radiation Oncology | Admitting: Radiation Oncology

## 2016-10-29 ENCOUNTER — Ambulatory Visit (HOSPITAL_BASED_OUTPATIENT_CLINIC_OR_DEPARTMENT_OTHER): Payer: Medicare Other

## 2016-10-29 VITALS — BP 103/71 | HR 99 | Temp 99.0°F | Resp 18

## 2016-10-29 DIAGNOSIS — C01 Malignant neoplasm of base of tongue: Secondary | ICD-10-CM | POA: Diagnosis not present

## 2016-10-29 DIAGNOSIS — Z5111 Encounter for antineoplastic chemotherapy: Secondary | ICD-10-CM | POA: Diagnosis not present

## 2016-10-29 DIAGNOSIS — C770 Secondary and unspecified malignant neoplasm of lymph nodes of head, face and neck: Secondary | ICD-10-CM

## 2016-10-29 DIAGNOSIS — Z510 Encounter for antineoplastic radiation therapy: Secondary | ICD-10-CM | POA: Diagnosis not present

## 2016-10-29 MED ORDER — DIPHENHYDRAMINE HCL 50 MG/ML IJ SOLN
INTRAMUSCULAR | Status: AC
  Filled 2016-10-29: qty 1

## 2016-10-29 MED ORDER — PALONOSETRON HCL INJECTION 0.25 MG/5ML
0.2500 mg | Freq: Once | INTRAVENOUS | Status: AC
  Administered 2016-10-29: 0.25 mg via INTRAVENOUS

## 2016-10-29 MED ORDER — HEPARIN SOD (PORK) LOCK FLUSH 100 UNIT/ML IV SOLN
500.0000 [IU] | Freq: Once | INTRAVENOUS | Status: AC | PRN
  Administered 2016-10-29: 500 [IU]
  Filled 2016-10-29: qty 5

## 2016-10-29 MED ORDER — PACLITAXEL CHEMO INJECTION 300 MG/50ML
45.0000 mg/m2 | Freq: Once | INTRAVENOUS | Status: AC
  Administered 2016-10-29: 90 mg via INTRAVENOUS
  Filled 2016-10-29: qty 15

## 2016-10-29 MED ORDER — SODIUM CHLORIDE 0.9 % IV SOLN
210.0000 mg | Freq: Once | INTRAVENOUS | Status: AC
  Administered 2016-10-29: 210 mg via INTRAVENOUS
  Filled 2016-10-29: qty 21

## 2016-10-29 MED ORDER — SODIUM CHLORIDE 0.9 % IV SOLN
20.0000 mg | Freq: Once | INTRAVENOUS | Status: AC
  Administered 2016-10-29: 20 mg via INTRAVENOUS
  Filled 2016-10-29: qty 2

## 2016-10-29 MED ORDER — FAMOTIDINE IN NACL 20-0.9 MG/50ML-% IV SOLN
INTRAVENOUS | Status: AC
  Filled 2016-10-29: qty 50

## 2016-10-29 MED ORDER — SODIUM CHLORIDE 0.9% FLUSH
10.0000 mL | INTRAVENOUS | Status: DC | PRN
  Administered 2016-10-29: 10 mL
  Filled 2016-10-29: qty 10

## 2016-10-29 MED ORDER — SODIUM CHLORIDE 0.9 % IV SOLN
Freq: Once | INTRAVENOUS | Status: AC
  Administered 2016-10-29: 12:00:00 via INTRAVENOUS

## 2016-10-29 MED ORDER — PALONOSETRON HCL INJECTION 0.25 MG/5ML
INTRAVENOUS | Status: AC
  Filled 2016-10-29: qty 5

## 2016-10-29 MED ORDER — DIPHENHYDRAMINE HCL 50 MG/ML IJ SOLN
25.0000 mg | Freq: Once | INTRAMUSCULAR | Status: AC
  Administered 2016-10-29: 25 mg via INTRAVENOUS

## 2016-10-29 MED ORDER — FAMOTIDINE IN NACL 20-0.9 MG/50ML-% IV SOLN
20.0000 mg | Freq: Once | INTRAVENOUS | Status: AC
  Administered 2016-10-29: 20 mg via INTRAVENOUS

## 2016-10-29 NOTE — Patient Instructions (Signed)
[facility] Discharge Instructions for Patients Receiving Chemotherapy  Today you received the following chemotherapy agents Taxol and Carboplatin  To help prevent nausea and vomiting after your treatment, we encourage you to take your nausea medication as prescribed by MD. **NO ZOFRAN FOR 3 DAYS AFTER TREATMENT. MAY TAKE ZOFRAN ON Monday**   If you develop nausea and vomiting that is not controlled by your nausea medication, call the clinic.   BELOW ARE SYMPTOMS THAT SHOULD BE REPORTED IMMEDIATELY:  *FEVER GREATER THAN 100.5 F  *CHILLS WITH OR WITHOUT FEVER  NAUSEA AND VOMITING THAT IS NOT CONTROLLED WITH YOUR NAUSEA MEDICATION  *UNUSUAL SHORTNESS OF BREATH  *UNUSUAL BRUISING OR BLEEDING  TENDERNESS IN MOUTH AND THROAT WITH OR WITHOUT PRESENCE OF ULCERS  *URINARY PROBLEMS  *BOWEL PROBLEMS  UNUSUAL RASH Items with * indicate a potential emergency and should be followed up as soon as possible.  Feel free to call the clinic you have any questions or concerns. The clinic phone number is [phone].  Please show the [place] at check-in to the Emergency Department and triage nurse.

## 2016-10-31 NOTE — Progress Notes (Addendum)
Oncology Nurse Navigator Documentation  To provide support, encouragement and care continuity, met with Joel Walton and his dtr during Est Pt appt with Dr. Lebron Conners. He reported increasing throat soreness, finding it more difficult to swallow.  They voiced understanding all Rx will be changed to liquid form to facilitate PEG administration. He was encouraged to conduct HEP as able, drink liquids as able to insure swallowing integrity.  He voiced understanding. I reviewed timeframe of post-tmt PET. They voiced understanding he will proceed with tomorrow's carbo/tazol but future infusions to be evaluated. We discussed his attendance at next Tuesday morning's H&[facility] MDC, dtr indicated she is unable to bring him d/t job obligation.  She agreed 10/9 [place] workable. I encouraged them to call me with questions/concerns.  Gayleen Orem, RN, BSN, [place] [facility] Oncology Nurse [place] at [place] [phone]

## 2016-11-01 ENCOUNTER — Telehealth: Payer: Self-pay

## 2016-11-01 ENCOUNTER — Other Ambulatory Visit (HOSPITAL_BASED_OUTPATIENT_CLINIC_OR_DEPARTMENT_OTHER): Payer: Medicare Other

## 2016-11-01 ENCOUNTER — Other Ambulatory Visit: Payer: Self-pay | Admitting: Radiation Oncology

## 2016-11-01 ENCOUNTER — Other Ambulatory Visit: Payer: Self-pay

## 2016-11-01 ENCOUNTER — Ambulatory Visit
Admission: RE | Admit: 2016-11-01 | Discharge: 2016-11-01 | Disposition: A | Discharge status: Home / Self Care | Payer: Medicare Other | Source: Ambulatory Visit | Attending: Radiation Oncology | Admitting: Radiation Oncology

## 2016-11-01 DIAGNOSIS — C01 Malignant neoplasm of base of tongue: Secondary | ICD-10-CM

## 2016-11-01 DIAGNOSIS — Z510 Encounter for antineoplastic radiation therapy: Secondary | ICD-10-CM | POA: Diagnosis not present

## 2016-11-01 DIAGNOSIS — C770 Secondary and unspecified malignant neoplasm of lymph nodes of head, face and neck: Secondary | ICD-10-CM

## 2016-11-01 LAB — CBC WITH DIFFERENTIAL/PLATELET
BASO%: 0.6 % (ref 0.0–2.0)
BASOS ABS: 0 10*3/uL (ref 0.0–0.1)
EOS ABS: 0 10*3/uL (ref 0.0–0.5)
EOS%: 1.9 % (ref 0.0–7.0)
HCT: 33.5 % — ABNORMAL LOW (ref 38.4–49.9)
HEMOGLOBIN: 11.1 g/dL — AB (ref 13.0–17.1)
LYMPH#: 0.2 10*3/uL — AB (ref 0.9–3.3)
LYMPH%: 12.3 % — ABNORMAL LOW (ref 14.0–49.0)
MCH: 30.7 pg (ref 27.2–33.4)
MCHC: 33.1 g/dL (ref 32.0–36.0)
MCV: 92.8 fL (ref 79.3–98.0)
MONO#: 0.2 10*3/uL (ref 0.1–0.9)
MONO%: 14.3 % — ABNORMAL HIGH (ref 0.0–14.0)
NEUT#: 1.1 10*3/uL — ABNORMAL LOW (ref 1.5–6.5)
NEUT%: 70.9 % (ref 39.0–75.0)
Platelets: 130 10*3/uL — ABNORMAL LOW (ref 140–400)
RBC: 3.61 10*6/uL — ABNORMAL LOW (ref 4.20–5.82)
RDW: 14.1 % (ref 11.0–14.6)
WBC: 1.5 10*3/uL — ABNORMAL LOW (ref 4.0–10.3)

## 2016-11-01 LAB — COMPREHENSIVE METABOLIC PANEL
ALBUMIN: 3 g/dL — AB (ref 3.5–5.0)
ALT: 48 U/L (ref 0–55)
AST: 18 U/L (ref 5–34)
Alkaline Phosphatase: 100 U/L (ref 40–150)
Anion Gap: 8 mEq/L (ref 3–11)
BUN: 13.9 mg/dL (ref 7.0–26.0)
CHLORIDE: 96 meq/L — AB (ref 98–109)
CO2: 31 meq/L — AB (ref 22–29)
Calcium: 9.3 mg/dL (ref 8.4–10.4)
Creatinine: 0.6 mg/dL — ABNORMAL LOW (ref 0.7–1.3)
GLUCOSE: 89 mg/dL (ref 70–140)
POTASSIUM: 4.4 meq/L (ref 3.5–5.1)
SODIUM: 135 meq/L — AB (ref 136–145)
Total Bilirubin: 0.43 mg/dL (ref 0.20–1.20)
Total Protein: 6.7 g/dL (ref 6.4–8.3)

## 2016-11-01 MED ORDER — LORAZEPAM 2 MG/ML PO CONC: 1.0000 mg | ORAL | 0 refills | Status: DC | PRN

## 2016-11-01 NOTE — Telephone Encounter (Signed)
Liquid ativan not available at [facility]. Verbal order placed at [facility]. Message left on pt voicemail to pick up hard copy script at [facility] and take to [facility] to fill it tomorrow afternoon. Medication being sent in and should arrive at the pharmacy some time tomorrow.

## 2016-11-02 ENCOUNTER — Ambulatory Visit
Admission: RE | Admit: 2016-11-02 | Discharge: 2016-11-02 | Disposition: A | Discharge status: Home / Self Care | Payer: Medicare Other | Source: Ambulatory Visit | Attending: Radiation Oncology | Admitting: Radiation Oncology

## 2016-11-02 DIAGNOSIS — Z510 Encounter for antineoplastic radiation therapy: Secondary | ICD-10-CM | POA: Diagnosis not present

## 2016-11-03 ENCOUNTER — Other Ambulatory Visit: Payer: Self-pay | Admitting: Radiation Oncology

## 2016-11-03 ENCOUNTER — Ambulatory Visit
Admission: RE | Admit: 2016-11-03 | Discharge: 2016-11-03 | Disposition: A | Discharge status: Home / Self Care | Payer: Medicare Other | Source: Ambulatory Visit | Attending: Radiation Oncology | Admitting: Radiation Oncology

## 2016-11-03 ENCOUNTER — Telehealth: Payer: Self-pay | Admitting: *Deleted

## 2016-11-03 DIAGNOSIS — Z510 Encounter for antineoplastic radiation therapy: Secondary | ICD-10-CM | POA: Diagnosis not present

## 2016-11-03 DIAGNOSIS — C01 Malignant neoplasm of base of tongue: Secondary | ICD-10-CM

## 2016-11-03 MED ORDER — DEXAMETHASONE 0.5 MG/5ML PO SOLN: ORAL | 0 refills | Status: DC

## 2016-11-03 MED FILL — DEXAMETHASONE 0.5 MG/5 ML L: 0.5 | 12 days supply | Qty: 500 | Fill #0

## 2016-11-03 NOTE — Telephone Encounter (Signed)
Oncology Nurse Navigator Documentation  Spoke with patient's dtr, informed her per Dr. Lebron Conners Friday's chemotherapy is cancelled d/t low blood counts.  I noted Nutritionist Dory Peru will be notified of cancellation so she can reschedule Nutrition appt.  Gayleen Orem, RN, BSN, [place] [facility] Oncology Nurse [place] at [place] [phone]

## 2016-11-03 NOTE — Telephone Encounter (Signed)
Oncology Nurse Navigator Documentation  Received call from Mr. Persley dtr, [name].  She requested confirmation of chemo scheduled for this Friday's 1145 is still valid in context of Monday's labs.  She voiced understanding either I or Dr. Clydene Laming RN will call her to confirm.  Gayleen Orem, RN, BSN, [place] [facility] Oncology Nurse [place] at [place] [phone]

## 2016-11-03 NOTE — Telephone Encounter (Signed)
His neutrophils are too low for chemo this week.

## 2016-11-03 NOTE — Assessment & Plan Note (Signed)
71 y.o. 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 No evidence of distal metastatic disease at this time based on the initial PET/CT. Patient was not a candidate for cisplatin-based chemotherapy due to pre-existing hearing loss requiring hearing aids.   Patient was started on systemic chemotherapy with combination of weekly carboplatin and paclitaxel for 10/01/16. The course has been complicated by oral thrush, pharyngeal pain requiring opioid medications, opioid-induced constipation so far. At the present time, constipation is under good control. Liquid morphine appears to be controlling pain well, and the patient did not tolerate transdermal fentanyl due to CNS side effects. Anxiety is well controlled on current dose of lorazepam.  Clinical evaluation and lab work are permissive to proceed with the next week of systemic chemotherapy.  Plan: --Increase Lorazepam to 1mg  PO Q6hrs PRN --proceed with the 5th dose of systemic therapy with carboplatin and paclitaxel --RTC 1 week: labs, clinic visit for continued chemoradiotherapy and toxicity monitoring

## 2016-11-03 NOTE — Progress Notes (Signed)
Joel Walton Follow-up Visit:  Assessment: Walton of base of tongue ([place]) 71 y.o. 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 No evidence of distal metastatic disease at this time based on the initial PET/CT. Patient was not a candidate for cisplatin-based chemotherapy due to pre-existing hearing loss requiring hearing aids.   Patient was started on systemic chemotherapy with combination of weekly carboplatin and paclitaxel for 10/01/16. The course has been complicated by oral thrush, pharyngeal pain requiring opioid medications, opioid-induced constipation so far. At the present time, constipation is under good control. Liquid morphine appears to be controlling pain well, and the patient did not tolerate transdermal fentanyl due to CNS side effects. Anxiety is well controlled on current dose of lorazepam.  Clinical evaluation and lab work are permissive to proceed with the next week of systemic chemotherapy.  Plan: --Increase Lorazepam to '1mg'$  PO Q6hrs PRN --proceed with the 5th dose of systemic therapy with carboplatin and paclitaxel --RTC 1 week: labs, clinic visit for continued chemoradiotherapy and toxicity monitoring   Voice recognition software was used and creation of this note. Despite my best effort at editing the text, some misspelling/errors may have occurred.  No orders of the defined types were placed in this encounter.   Walton Staging Walton of base of tongue ([place]) Staging form: Pharynx - HPV-Mediated Oropharynx, AJCC 8th Edition - Clinical: Stage II (cT2, cN2, cM0, p16: Positive) - Signed by Eppie Gibson, MD on 09/14/2016   All questions were answered.  . The patient knows to call the clinic with any problems, questions or concerns.  This note  was electronically signed.    History of Presenting Illness Joel Walton is a 71 y.o. 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 Patient is currently undergoing curative-intent treatment with chemoradiotherapy. He presents to the clinic for possible 4th week of carboplatin and paclitaxel therapy administered concurrently with radiation.  At the present time, patient is requiring oral opioid medications for pain control due to radiation-induced mucositis as well as lorazepam for control of anxiety. Pain control is adequate, anxiety control has improved. Patient reports using PEG tube, but still continuing to drink liquids by mouth. His nutrition is entirely administered by PEG tube at this point in time. Denies any interval fevers, chills or night sweats. No loss of sensation or minimal dexterity.  Oncological/hematological History:   Walton of base of tongue ([place])   08/26/2016 Pathology Results    Tongue biopsy confirmed squamous cell carcinoma      08/27/2016 Initial Diagnosis    Walton of base of tongue ([place]): positive for p16      09/01/2016 Imaging    CT Neck: Large necrotic an ulcerating lesion at the right base of the tongue measuring 3.9 cm. Pathological lymphadenopathy in the right neck noted.      09/07/2016 Imaging    PET-CT: Large hypermetabolic mass at the right base of the tongue with extension across the midline. Significant hypermetabolic level II right-sided lymphadenopathy, possible pathological lymphadenopathy on the left side at level II.      09/24/2016 Procedure    Ultrasound and fluoroscopically guided right internal jugular single lumen power port catheter insertion. Tip in the SVC/RA junction. Catheter ready for use  09/24/2016 Procedure    Fluoroscopic insertion of a 20-French "pull-through" gastrostomy.      09/28/2016 -  Radiation Therapy           10/01/2016 -   Chemotherapy    Carboplatin AUC2 QWk + Paclitaxel '45mg'$ /m2 QWk concurrent with radiation therapy --Wk #1, 10/01/16: --Wk #2, 10/08/16: --Wk #3, 10/14/16: --Wk #4, 10/22/16:       Medical History: Past Medical History:  Diagnosis Date  . Hyperlipidemia   . Hypertension     Surgical History: Past Surgical History:  Procedure Laterality Date  . ELBOW SURGERY Right   . IR FLUORO GUIDE PORT INSERTION RIGHT  09/24/2016  . IR GASTROSTOMY TUBE MOD SED  09/24/2016  . IR US GUIDE VASC ACCESS RIGHT  09/24/2016  . KNEE SURGERY Bilateral    Not total knee replacements  . SHOULDER ARTHROSCOPY Right     Family History: No family history on file.  Social History: Social History   Social History  . Marital status: Married    Spouse name: N/A  . Number of children: 2  . Years of education: N/A   Occupational History  . Retired      Personal assistant   Social History Main Topics  . Smoking status: Former Smoker    Packs/day: 0.50  . Smokeless tobacco: Never Used     Comment: Quit in 1978  . Alcohol use No     Comment: 3-4 beers daily  . Drug use: No  . Sexual activity: Not Currently   Other Topics Concern  . Not on file   Social History Narrative  . No narrative on file    Allergies: No Known Allergies  Medications:  Current Outpatient Prescriptions  Medication Sig Dispense Refill  . diphenhydrAMINE (BENYLIN) 12.5 MG/5ML syrup Take 2.5 mLs (6.25 mg total) by mouth at bedtime as needed for allergies. 120 mL 0  . fluconazole (DIFLUCAN) 10 MG/ML suspension Place 10 mLs (100 mg total) into feeding tube daily. 35 mL 0  . lidocaine (XYLOCAINE) 2 % solution Patient: Mix 1part 2% viscous lidocaine, 1part H20. Swish & swallow 11m of diluted mixture, 39m before meals and at bedtime, up to QID 100 mL 5  . lidocaine-prilocaine (EMLA) cream Apply to affected area once 30 g 3  . LORazepam (LORAZEPAM INTENSOL) 2 MG/ML concentrated solution Take 0.5 mLs (1 mg total) by mouth  every 4 (four) hours as needed for anxiety (nausea). 30 mL 0  . morphine (ROXANOL) 20 MG/ML concentrated solution Take 1 mL (20 mg total) by mouth every 2 (two) hours as needed for severe pain. 120 mL 0  . Multiple Vitamin (MULTIVITAMIN) tablet Take 1 tablet by mouth daily.    . Nutritional Supplements (FEEDING SUPPLEMENT, OSMOLITE 1.5 CAL,) LIQD Give 1.5 bottles Osmolite 1.5 QID with 60 mL free water before and after via PEG. Increase to 2 bottles once daily and continue 1.5 bottles TID as tolerated. Drink or flush PEG with additional 240 mL free water TID. Send supplies and formula. Call daughter, JaRoselyn Reefat [phone] arrange delivery for patient. 1541 mL 0  . omeprazole (PRILOSEC) 20 MG capsule   0  . ondansetron (ZOFRAN) 8 MG tablet Take 1 tablet (8 mg total) by mouth 2 (two) times daily as needed for refractory nausea / vomiting. Start on day 3 after chemo. 30 tablet 1  . predniSONE (DELTASONE) 20 MG tablet 3 tabs poqday 1-2, 2 tabs poqday 3-4, 1 tab poqday 5-6 12 tablet 0  .  prochlorperazine (COMPAZINE) 10 MG tablet Take 1 tablet (10 mg total) by mouth every 6 (six) hours as needed (Nausea or vomiting). 30 tablet 1   No current facility-administered medications for this visit.     Review of Systems: Review of Systems  All other systems reviewed and are negative.    PHYSICAL EXAMINATION Blood pressure 112/82, pulse 95, temperature 99.3 F (37.4 C), temperature source Oral, resp. rate 18, height 5\' 9"  (1.753 m), weight 167 lb 11.2 oz (76.1 kg), SpO2 100 %.  ECOG PERFORMANCE STATUS: 1 - Symptomatic but completely ambulatory  Physical Exam  Constitutional: He is oriented to person, place, and time and well-developed, well-nourished, and in no distress. No distress.  HENT:  Mouth/Throat: Oropharynx is clear and moist and mucous membranes are normal. No dental abscesses.  Edentulous, wearing dentures. No visible lesion noted no anterior oral ulcerations, leukoplakia. Visible  oropharynx without abnormality.  Eyes: Pupils are equal, round, and reactive to light. Conjunctivae and EOM are normal. No scleral icterus.  Neck:  Palpable enlarged and hard lymph nodes in the right neck. He did not appear to be too other soft tissues.  Cardiovascular: Normal rate and regular rhythm.   No murmur heard. Pulmonary/Chest: Effort normal and breath sounds normal. No respiratory distress. He has no wheezes. He has no rales.  Abdominal: Soft. Bowel sounds are normal. He exhibits no distension and no mass. There is no tenderness.  Musculoskeletal: Normal range of motion. He exhibits no edema.  Neurological: He is alert and oriented to person, place, and time. He has normal reflexes. No cranial nerve deficit. Coordination normal.  Skin: Skin is warm and dry. He is not diaphoretic. There is erythema.  Into starting to develop erythema over bilateral upper neck consistent with radiation changes     LABORATORY DATA: I have personally reviewed the data as listed: Appointment on 10/25/2016  Component Date Value Ref Range Status  . WBC 10/25/2016 1.8* 4.0 - 10.3 10e3/uL Final  . NEUT# 10/25/2016 1.4* 1.5 - 6.5 10e3/uL Final  . HGB 10/25/2016 11.5* 13.0 - 17.1 g/dL Final  . HCT 10/27/2016 34.2* 38.4 - 49.9 % Final  . Platelets 10/25/2016 134* 140 - 400 10e3/uL Final  . MCV 10/25/2016 91.1  79.3 - 98.0 fL Final  . MCH 10/25/2016 30.6  27.2 - 33.4 pg Final  . MCHC 10/25/2016 33.5  32.0 - 36.0 g/dL Final  . RBC 10/27/2016 3.75* 4.20 - 5.82 10e6/uL Final  . RDW 10/25/2016 13.6  11.0 - 14.6 % Final  . lymph# 10/25/2016 0.1* 0.9 - 3.3 10e3/uL Final  . MONO# 10/25/2016 0.2  0.1 - 0.9 10e3/uL Final  . Eosinophils Absolute 10/25/2016 0.0  0.0 - 0.5 10e3/uL Final  . Basophils Absolute 10/25/2016 0.0  0.0 - 0.1 10e3/uL Final  . NEUT% 10/25/2016 81.2* 39.0 - 75.0 % Final  . LYMPH% 10/25/2016 7.7* 14.0 - 49.0 % Final  . MONO% 10/25/2016 10.0  0.0 - 14.0 % Final  . EOS% 10/25/2016 0.4  0.0 -  7.0 % Final  . BASO% 10/25/2016 0.7  0.0 - 2.0 % Final  . Sodium 10/25/2016 135* 136 - 145 mEq/L Final  . Potassium 10/25/2016 4.3  3.5 - 5.1 mEq/L Final  . Chloride 10/25/2016 96* 98 - 109 mEq/L Final  . CO2 10/25/2016 31* 22 - 29 mEq/L Final  . Glucose 10/25/2016 143* 70 - 140 mg/dl Final   Glucose reference range is for nonfasting patients. Fasting glucose reference range is 70- 100.  10/27/2016  BUN 10/25/2016 12.4  7.0 - 26.0 mg/dL Final  . Creatinine 10/25/2016 0.6* 0.7 - 1.3 mg/dL Final  . Total Bilirubin 10/25/2016 0.44  0.20 - 1.20 mg/dL Final  . Alkaline Phosphatase 10/25/2016 95  40 - 150 U/L Final  . AST 10/25/2016 23  5 - 34 U/L Final  . ALT 10/25/2016 63* 0 - 55 U/L Final  . Total Protein 10/25/2016 6.5  6.4 - 8.3 g/dL Final  . Albumin 10/25/2016 2.9* 3.5 - 5.0 g/dL Final  . Calcium 10/25/2016 9.0  8.4 - 10.4 mg/dL Final  . Anion Gap 10/25/2016 8  3 - 11 mEq/L Final  . EGFR 10/25/2016 >90  >90 ml/min/1.73 m2 Final   eGFR is calculated using the CKD-EPI Creatinine Equation (2009)       Ardath Sax, MD

## 2016-11-04 ENCOUNTER — Ambulatory Visit
Admission: RE | Admit: 2016-11-04 | Discharge: 2016-11-04 | Disposition: A | Discharge status: Home / Self Care | Payer: Medicare Other | Source: Ambulatory Visit | Attending: Radiation Oncology | Admitting: Radiation Oncology

## 2016-11-04 DIAGNOSIS — Z510 Encounter for antineoplastic radiation therapy: Secondary | ICD-10-CM | POA: Diagnosis not present

## 2016-11-05 ENCOUNTER — Encounter: Payer: Self-pay | Admitting: Nutrition

## 2016-11-05 ENCOUNTER — Ambulatory Visit: Payer: Self-pay

## 2016-11-05 ENCOUNTER — Ambulatory Visit
Admission: RE | Admit: 2016-11-05 | Discharge: 2016-11-05 | Disposition: A | Discharge status: Home / Self Care | Payer: Medicare Other | Source: Ambulatory Visit | Attending: Radiation Oncology | Admitting: Radiation Oncology

## 2016-11-05 ENCOUNTER — Telehealth: Payer: Self-pay | Admitting: *Deleted

## 2016-11-05 DIAGNOSIS — Z510 Encounter for antineoplastic radiation therapy: Secondary | ICD-10-CM | POA: Diagnosis not present

## 2016-11-05 NOTE — Telephone Encounter (Signed)
Called patient to answer question, lvm for a return call 

## 2016-11-08 ENCOUNTER — Other Ambulatory Visit (HOSPITAL_BASED_OUTPATIENT_CLINIC_OR_DEPARTMENT_OTHER): Payer: Medicare Other

## 2016-11-08 ENCOUNTER — Ambulatory Visit
Admission: RE | Admit: 2016-11-08 | Discharge: 2016-11-08 | Disposition: A | Discharge status: Home / Self Care | Payer: Medicare Other | Source: Ambulatory Visit | Attending: Radiation Oncology | Admitting: Radiation Oncology

## 2016-11-08 ENCOUNTER — Ambulatory Visit: Payer: Medicare Other | Attending: Radiation Oncology

## 2016-11-08 ENCOUNTER — Other Ambulatory Visit: Payer: Self-pay | Admitting: Radiation Oncology

## 2016-11-08 DIAGNOSIS — C01 Malignant neoplasm of base of tongue: Secondary | ICD-10-CM

## 2016-11-08 DIAGNOSIS — R1310 Dysphagia, unspecified: Secondary | ICD-10-CM | POA: Insufficient documentation

## 2016-11-08 DIAGNOSIS — C770 Secondary and unspecified malignant neoplasm of lymph nodes of head, face and neck: Secondary | ICD-10-CM

## 2016-11-08 DIAGNOSIS — Z510 Encounter for antineoplastic radiation therapy: Secondary | ICD-10-CM | POA: Diagnosis not present

## 2016-11-08 LAB — COMPREHENSIVE METABOLIC PANEL
ALT: 53 U/L (ref 0–55)
ANION GAP: 9 meq/L (ref 3–11)
AST: 22 U/L (ref 5–34)
Albumin: 3 g/dL — ABNORMAL LOW (ref 3.5–5.0)
Alkaline Phosphatase: 92 U/L (ref 40–150)
BUN: 15.3 mg/dL (ref 7.0–26.0)
CHLORIDE: 98 meq/L (ref 98–109)
CO2: 30 meq/L — AB (ref 22–29)
Calcium: 9.3 mg/dL (ref 8.4–10.4)
Creatinine: 0.6 mg/dL — ABNORMAL LOW (ref 0.7–1.3)
Glucose: 109 mg/dl (ref 70–140)
POTASSIUM: 3.5 meq/L (ref 3.5–5.1)
Sodium: 137 mEq/L (ref 136–145)
Total Bilirubin: 0.29 mg/dL (ref 0.20–1.20)
Total Protein: 6.6 g/dL (ref 6.4–8.3)

## 2016-11-08 LAB — CBC WITH DIFFERENTIAL/PLATELET
BASO%: 0.1 % (ref 0.0–2.0)
BASOS ABS: 0 10*3/uL (ref 0.0–0.1)
EOS ABS: 0 10*3/uL (ref 0.0–0.5)
EOS%: 0.3 % (ref 0.0–7.0)
HEMATOCRIT: 33.7 % — AB (ref 38.4–49.9)
HEMOGLOBIN: 11.3 g/dL — AB (ref 13.0–17.1)
LYMPH#: 0.2 10*3/uL — AB (ref 0.9–3.3)
LYMPH%: 7.3 % — ABNORMAL LOW (ref 14.0–49.0)
MCH: 31.3 pg (ref 27.2–33.4)
MCHC: 33.6 g/dL (ref 32.0–36.0)
MCV: 93.1 fL (ref 79.3–98.0)
MONO#: 0.5 10*3/uL (ref 0.1–0.9)
MONO%: 15.9 % — ABNORMAL HIGH (ref 0.0–14.0)
NEUT%: 76.4 % — ABNORMAL HIGH (ref 39.0–75.0)
NEUTROS ABS: 2.3 10*3/uL (ref 1.5–6.5)
Platelets: 240 10*3/uL (ref 140–400)
RBC: 3.62 10*6/uL — ABNORMAL LOW (ref 4.20–5.82)
RDW: 15.1 % — AB (ref 11.0–14.6)
WBC: 3 10*3/uL — ABNORMAL LOW (ref 4.0–10.3)

## 2016-11-08 MED ORDER — SCOPOLAMINE 1 MG/3DAYS TD PT72: 1.0000 | MEDICATED_PATCH | TRANSDERMAL | 4 refills | Status: DC

## 2016-11-08 MED ORDER — LORAZEPAM 2 MG/ML PO CONC: 1.0000 mg | ORAL | 0 refills | Status: DC | PRN

## 2016-11-08 MED FILL — LORazepam 2 MG/ML CONC: 2 | 10 days supply | Qty: 30 | Fill #0

## 2016-11-08 MED FILL — TRANSDERM-SCOP 1.5 MG/3 DAY: 1 | 12 days supply | Qty: 4 | Fill #0

## 2016-11-09 ENCOUNTER — Ambulatory Visit
Admission: RE | Admit: 2016-11-09 | Discharge: 2016-11-09 | Disposition: A | Discharge status: Home / Self Care | Payer: Medicare Other | Source: Ambulatory Visit | Attending: Radiation Oncology | Admitting: Radiation Oncology

## 2016-11-09 DIAGNOSIS — Z510 Encounter for antineoplastic radiation therapy: Secondary | ICD-10-CM | POA: Diagnosis not present

## 2016-11-10 ENCOUNTER — Ambulatory Visit: Payer: Medicare Other

## 2016-11-11 ENCOUNTER — Telehealth: Payer: Self-pay

## 2016-11-11 ENCOUNTER — Ambulatory Visit
Admission: RE | Admit: 2016-11-11 | Discharge: 2016-11-11 | Disposition: A | Discharge status: Home / Self Care | Payer: Medicare Other | Source: Ambulatory Visit | Attending: Radiation Oncology | Admitting: Radiation Oncology

## 2016-11-11 DIAGNOSIS — Z510 Encounter for antineoplastic radiation therapy: Secondary | ICD-10-CM | POA: Diagnosis not present

## 2016-11-11 NOTE — Telephone Encounter (Signed)
Spoke with patient concerning his newly scheduled appointment with est. Per 10/4 sch message.

## 2016-11-12 ENCOUNTER — Ambulatory Visit: Payer: Medicare Other | Admitting: Nutrition

## 2016-11-12 ENCOUNTER — Ambulatory Visit
Admission: RE | Admit: 2016-11-12 | Discharge: 2016-11-12 | Disposition: A | Discharge status: Home / Self Care | Payer: Medicare Other | Source: Ambulatory Visit | Attending: Radiation Oncology | Admitting: Radiation Oncology

## 2016-11-12 ENCOUNTER — Ambulatory Visit (HOSPITAL_BASED_OUTPATIENT_CLINIC_OR_DEPARTMENT_OTHER): Payer: Medicare Other | Admitting: Hematology and Oncology

## 2016-11-12 ENCOUNTER — Telehealth: Payer: Self-pay | Admitting: Hematology and Oncology

## 2016-11-12 ENCOUNTER — Ambulatory Visit (HOSPITAL_BASED_OUTPATIENT_CLINIC_OR_DEPARTMENT_OTHER): Payer: Medicare Other

## 2016-11-12 VITALS — BP 115/75 | HR 103 | Temp 99.2°F | Resp 20 | Ht 69.0 in | Wt 167.1 lb

## 2016-11-12 VITALS — HR 82

## 2016-11-12 DIAGNOSIS — C01 Malignant neoplasm of base of tongue: Secondary | ICD-10-CM

## 2016-11-12 DIAGNOSIS — Z5111 Encounter for antineoplastic chemotherapy: Secondary | ICD-10-CM

## 2016-11-12 DIAGNOSIS — C770 Secondary and unspecified malignant neoplasm of lymph nodes of head, face and neck: Secondary | ICD-10-CM

## 2016-11-12 DIAGNOSIS — Z510 Encounter for antineoplastic radiation therapy: Secondary | ICD-10-CM | POA: Diagnosis not present

## 2016-11-12 MED ORDER — DIPHENHYDRAMINE HCL 50 MG/ML IJ SOLN
INTRAMUSCULAR | Status: AC
  Filled 2016-11-12: qty 1

## 2016-11-12 MED ORDER — MORPHINE SULFATE (CONCENTRATE) 10 MG /0.5 ML PO SOLN: 10.0000 mg | ORAL | 0 refills | Status: DC | PRN

## 2016-11-12 MED ORDER — SODIUM CHLORIDE 0.9% FLUSH
10.0000 mL | INTRAVENOUS | Status: DC | PRN
  Administered 2016-11-12: 10 mL
  Filled 2016-11-12: qty 10

## 2016-11-12 MED ORDER — SODIUM CHLORIDE 0.9 % IV SOLN
Freq: Once | INTRAVENOUS | Status: AC
  Administered 2016-11-12: 13:00:00 via INTRAVENOUS

## 2016-11-12 MED ORDER — SODIUM CHLORIDE 0.9 % IV SOLN
210.0000 mg | Freq: Once | INTRAVENOUS | Status: AC
  Administered 2016-11-12: 210 mg via INTRAVENOUS
  Filled 2016-11-12: qty 21

## 2016-11-12 MED ORDER — SODIUM CHLORIDE 0.9 % IV SOLN
20.0000 mg | Freq: Once | INTRAVENOUS | Status: AC
  Administered 2016-11-12: 20 mg via INTRAVENOUS
  Filled 2016-11-12: qty 2

## 2016-11-12 MED ORDER — PALONOSETRON HCL INJECTION 0.25 MG/5ML
INTRAVENOUS | Status: AC
  Filled 2016-11-12: qty 5

## 2016-11-12 MED ORDER — SODIUM CHLORIDE 0.9 % IV SOLN
45.0000 mg/m2 | Freq: Once | INTRAVENOUS | Status: AC
  Administered 2016-11-12: 90 mg via INTRAVENOUS
  Filled 2016-11-12: qty 15

## 2016-11-12 MED ORDER — SCOPOLAMINE 1 MG/3DAYS TD PT72: 1.0000 | MEDICATED_PATCH | TRANSDERMAL | 4 refills | Status: DC

## 2016-11-12 MED ORDER — FAMOTIDINE IN NACL 20-0.9 MG/50ML-% IV SOLN
20.0000 mg | Freq: Once | INTRAVENOUS | Status: AC
  Administered 2016-11-12: 20 mg via INTRAVENOUS

## 2016-11-12 MED ORDER — DIPHENHYDRAMINE HCL 50 MG/ML IJ SOLN
25.0000 mg | Freq: Once | INTRAMUSCULAR | Status: AC
  Administered 2016-11-12: 25 mg via INTRAVENOUS

## 2016-11-12 MED ORDER — PALONOSETRON HCL INJECTION 0.25 MG/5ML
0.2500 mg | Freq: Once | INTRAVENOUS | Status: AC
  Administered 2016-11-12: 0.25 mg via INTRAVENOUS

## 2016-11-12 MED ORDER — FAMOTIDINE IN NACL 20-0.9 MG/50ML-% IV SOLN
INTRAVENOUS | Status: AC
Start: 2016-11-12 — End: 2016-11-12
  Filled 2016-11-12: qty 50

## 2016-11-12 MED ORDER — HEPARIN SOD (PORK) LOCK FLUSH 100 UNIT/ML IV SOLN
500.0000 [IU] | Freq: Once | INTRAVENOUS | Status: AC | PRN
  Administered 2016-11-12: 500 [IU]
  Filled 2016-11-12: qty 5

## 2016-11-12 MED FILL — MORPHINE SULF 100 MG/5 ML S: 100 | 40 days supply | Qty: 120 | Fill #0

## 2016-11-12 MED FILL — TRANSDERM-SCOP 1.5 MG/3 DAY: 1 | 12 days supply | Qty: 4 | Fill #0

## 2016-11-12 NOTE — Patient Instructions (Signed)
[facility] Discharge Instructions for Patients Receiving Chemotherapy  Today you received the following chemotherapy agents Taxol and Carboplatin  To help prevent nausea and vomiting after your treatment, we encourage you to take your nausea medication as prescribed by MD. **NO ZOFRAN FOR 3 DAYS AFTER TREATMENT. MAY TAKE ZOFRAN ON Monday**   If you develop nausea and vomiting that is not controlled by your nausea medication, call the clinic.   BELOW ARE SYMPTOMS THAT SHOULD BE REPORTED IMMEDIATELY:  *FEVER GREATER THAN 100.5 F  *CHILLS WITH OR WITHOUT FEVER  NAUSEA AND VOMITING THAT IS NOT CONTROLLED WITH YOUR NAUSEA MEDICATION  *UNUSUAL SHORTNESS OF BREATH  *UNUSUAL BRUISING OR BLEEDING  TENDERNESS IN MOUTH AND THROAT WITH OR WITHOUT PRESENCE OF ULCERS  *URINARY PROBLEMS  *BOWEL PROBLEMS  UNUSUAL RASH Items with * indicate a potential emergency and should be followed up as soon as possible.  Feel free to call the clinic you have any questions or concerns. The clinic phone number is [phone].  Please show the [facility] at check-in to the Emergency Department and triage nurse.   Carboplatin injection What is this medicine? CARBOPLATIN (KAR boe pla tin) is a chemotherapy drug. It targets fast dividing cells, like cancer cells, and causes these cells to die. This medicine is used to treat ovarian cancer and many other cancers. This medicine may be used for other purposes; ask your health care provider or pharmacist if you have questions. COMMON BRAND NAME(S): Paraplatin What should I tell my health care provider before I take this medicine? They need to know if you have any of these conditions: -blood disorders -hearing problems -kidney disease -recent or ongoing radiation therapy -an unusual or allergic reaction to carboplatin, cisplatin, other chemotherapy, other medicines, foods, dyes, or preservatives -pregnant or trying to get  pregnant -breast-feeding How should I use this medicine? This drug is usually given as an infusion into a vein. It is administered in a hospital or clinic by a specially trained health care professional. Talk to your pediatrician regarding the use of this medicine in children. Special care may be needed. Overdosage: If you think you have taken too much of this medicine contact a poison control center or emergency room at once. NOTE: This medicine is only for you. Do not share this medicine with others. What if I miss a dose? It is important not to miss a dose. Call your doctor or health care professional if you are unable to keep an appointment. What may interact with this medicine? -medicines for seizures -medicines to increase blood counts like filgrastim, pegfilgrastim, sargramostim -some antibiotics like amikacin, gentamicin, neomycin, streptomycin, tobramycin -vaccines Talk to your doctor or health care professional before taking any of these medicines: -acetaminophen -aspirin -ibuprofen -ketoprofen -naproxen This list may not describe all possible interactions. Give your health care provider a list of all the medicines, herbs, non-prescription drugs, or dietary supplements you use. Also tell them if you smoke, drink alcohol, or use illegal drugs. Some items may interact with your medicine. What should I watch for while using this medicine? Your condition will be monitored carefully while you are receiving this medicine. You will need important blood work done while you are taking this medicine. This drug may make you feel generally unwell. This is not uncommon, as chemotherapy can affect healthy cells as well as cancer cells. Report any side effects. Continue your course of treatment even though you feel ill unless your doctor tells you to stop.  In some cases, you may be given additional medicines to help with side effects. Follow all directions for their use. Call your doctor or  health care professional for advice if you get a fever, chills or sore throat, or other symptoms of a cold or flu. Do not treat yourself. This drug decreases your body's ability to fight infections. Try to avoid being around people who are sick. This medicine may increase your risk to bruise or bleed. Call your doctor or health care professional if you notice any unusual bleeding. Be careful brushing and flossing your teeth or using a toothpick because you may get an infection or bleed more easily. If you have any dental work done, tell your dentist you are receiving this medicine. Avoid taking products that contain aspirin, acetaminophen, ibuprofen, naproxen, or ketoprofen unless instructed by your doctor. These medicines may hide a fever. Do not become pregnant while taking this medicine. Women should inform their doctor if they wish to become pregnant or think they might be pregnant. There is a potential for serious side effects to an unborn child. Talk to your health care professional or pharmacist for more information. Do not breast-feed an infant while taking this medicine. What side effects may I notice from receiving this medicine? Side effects that you should report to your doctor or health care professional as soon as possible: -allergic reactions like skin rash, itching or hives, swelling of the face, lips, or tongue -signs of infection - fever or chills, cough, sore throat, pain or difficulty passing urine -signs of decreased platelets or bleeding - bruising, pinpoint red spots on the skin, black, tarry stools, nosebleeds -signs of decreased red blood cells - unusually weak or tired, fainting spells, lightheadedness -breathing problems -changes in hearing -changes in vision -chest pain -high blood pressure -low blood counts - This drug may decrease the number of white blood cells, red blood cells and platelets. You may be at increased risk for infections and bleeding. -nausea and  vomiting -pain, swelling, redness or irritation at the injection site -pain, tingling, numbness in the hands or feet -problems with balance, talking, walking -trouble passing urine or change in the amount of urine Side effects that usually do not require medical attention (report to your doctor or health care professional if they continue or are bothersome): -hair loss -loss of appetite -metallic taste in the mouth or changes in taste This list may not describe all possible side effects. Call your doctor for medical advice about side effects. You may report side effects to FDA at 1-800-FDA-1088. Where should I keep my medicine? This drug is given in a hospital or clinic and will not be stored at home. NOTE: This sheet is a summary. It may not cover all possible information. If you have questions about this medicine, talk to your doctor, pharmacist, or health care provider.  2018 [employer]vier/Gold Standard (2007-05-02 14:38:05)   Paclitaxel injection What is this medicine? PACLITAXEL (PAK li TAX el) is a chemotherapy drug. It targets fast dividing cells, like cancer cells, and causes these cells to die. This medicine is used to treat ovarian cancer, breast cancer, and other cancers. This medicine may be used for other purposes; ask your health care provider or pharmacist if you have questions. COMMON BRAND NAME(S): Onxol, Taxol What should I tell my health care provider before I take this medicine? They need to know if you have any of these conditions: -blood disorders -irregular heartbeat -infection (especially a virus infection such as chickenpox, cold  sores, or herpes) -liver disease -previous or ongoing radiation therapy -an unusual or allergic reaction to paclitaxel, alcohol, polyoxyethylated castor oil, other chemotherapy agents, other medicines, foods, dyes, or preservatives -pregnant or trying to get pregnant -breast-feeding How should I use this medicine? This drug is given as an  infusion into a vein. It is administered in a hospital or clinic by a specially trained health care professional. Talk to your pediatrician regarding the use of this medicine in children. Special care may be needed. Overdosage: If you think you have taken too much of this medicine contact a poison control center or emergency room at once. NOTE: This medicine is only for you. Do not share this medicine with others. What if I miss a dose? It is important not to miss your dose. Call your doctor or health care professional if you are unable to keep an appointment. What may interact with this medicine? Do not take this medicine with any of the following medications: -disulfiram -metronidazole This medicine may also interact with the following medications: -cyclosporine -diazepam -ketoconazole -medicines to increase blood counts like filgrastim, pegfilgrastim, sargramostim -other chemotherapy drugs like cisplatin, doxorubicin, epirubicin, etoposide, teniposide, vincristine -quinidine -testosterone -vaccines -verapamil Talk to your doctor or health care professional before taking any of these medicines: -acetaminophen -aspirin -ibuprofen -ketoprofen -naproxen This list may not describe all possible interactions. Give your health care provider a list of all the medicines, herbs, non-prescription drugs, or dietary supplements you use. Also tell them if you smoke, drink alcohol, or use illegal drugs. Some items may interact with your medicine. What should I watch for while using this medicine? Your condition will be monitored carefully while you are receiving this medicine. You will need important blood work done while you are taking this medicine. This medicine can cause serious allergic reactions. To reduce your risk you will need to take other medicine(s) before treatment with this medicine. If you experience allergic reactions like skin rash, itching or hives, swelling of the face, lips, or  tongue, tell your doctor or health care professional right away. In some cases, you may be given additional medicines to help with side effects. Follow all directions for their use. This drug may make you feel generally unwell. This is not uncommon, as chemotherapy can affect healthy cells as well as cancer cells. Report any side effects. Continue your course of treatment even though you feel ill unless your doctor tells you to stop. Call your doctor or health care professional for advice if you get a fever, chills or sore throat, or other symptoms of a cold or flu. Do not treat yourself. This drug decreases your body's ability to fight infections. Try to avoid being around people who are sick. This medicine may increase your risk to bruise or bleed. Call your doctor or health care professional if you notice any unusual bleeding. Be careful brushing and flossing your teeth or using a toothpick because you may get an infection or bleed more easily. If you have any dental work done, tell your dentist you are receiving this medicine. Avoid taking products that contain aspirin, acetaminophen, ibuprofen, naproxen, or ketoprofen unless instructed by your doctor. These medicines may hide a fever. Do not become pregnant while taking this medicine. Women should inform their doctor if they wish to become pregnant or think they might be pregnant. There is a potential for serious side effects to an unborn child. Talk to your health care professional or pharmacist for more information. Do not breast-feed an  infant while taking this medicine. Men are advised not to father a child while receiving this medicine. This product may contain alcohol. Ask your pharmacist or healthcare provider if this medicine contains alcohol. Be sure to tell all healthcare providers you are taking this medicine. Certain medicines, like metronidazole and disulfiram, can cause an unpleasant reaction when taken with alcohol. The reaction includes  flushing, headache, nausea, vomiting, sweating, and increased thirst. The reaction can last from 30 minutes to several hours. What side effects may I notice from receiving this medicine? Side effects that you should report to your doctor or health care professional as soon as possible: -allergic reactions like skin rash, itching or hives, swelling of the face, lips, or tongue -low blood counts - This drug may decrease the number of white blood cells, red blood cells and platelets. You may be at increased risk for infections and bleeding. -signs of infection - fever or chills, cough, sore throat, pain or difficulty passing urine -signs of decreased platelets or bleeding - bruising, pinpoint red spots on the skin, black, tarry stools, nosebleeds -signs of decreased red blood cells - unusually weak or tired, fainting spells, lightheadedness -breathing problems -chest pain -high or low blood pressure -mouth sores -nausea and vomiting -pain, swelling, redness or irritation at the injection site -pain, tingling, numbness in the hands or feet -slow or irregular heartbeat -swelling of the ankle, feet, hands Side effects that usually do not require medical attention (report to your doctor or health care professional if they continue or are bothersome): -bone pain -complete hair loss including hair on your head, underarms, pubic hair, eyebrows, and eyelashes -changes in the color of fingernails -diarrhea -loosening of the fingernails -loss of appetite -muscle or joint pain -red flush to skin -sweating This list may not describe all possible side effects. Call your doctor for medical advice about side effects. You may report side effects to FDA at 1-800-FDA-1088. Where should I keep my medicine? This drug is given in a hospital or clinic and will not be stored at home. NOTE: This sheet is a summary. It may not cover all possible information. If you have questions about this medicine, talk to your  doctor, pharmacist, or health care provider.  2018 [employer]vier/Gold Standard (2014-11-26 19:58:00)

## 2016-11-12 NOTE — Progress Notes (Signed)
Nutrition follow-up completed with patient receiving chemoradiation therapy for tongue cancer. Today's weight documented as 167.8 pounds which is stable. Patient is excited that his treatment will soon be finished. Patient tolerating 6 bottles Osmolite 1.5 daily with free water flushes before and after bolus feeding. Patient able to drink water throughout the day. Patient beginning to complain regarding thick saliva. He denies other nutrition impact symptoms.  Estimated energy needs: 2200-2400 calories, 90-110 grams protein, 2.4 L fluid.  6 bottles Osmolite 1.5 provides 2130 cal, 89.4 g protein, and 1080 mL free water.  Nutrition diagnosis: Inadequate oral intake continues.  Intervention: Patient educated to continue current tube feeding regimen providing 90% of estimated nutrition needs. Encouraged patient to add one half bottle Osmolite 1.5 for additional calories and protein. Also encouraged patient to try protein powders by mouth. Samples were provided. Questions answered.  Teach back method used.  Monitoring, evaluation, goals: Patient will tolerate tube feeding plus oral intake to minimize weight loss.  Next visit: Monday, October 15.  **Disclaimer: This note was dictated with voice recognition software. Similar sounding words can inadvertently be transcribed and this note may contain transcription errors which may not have been corrected upon publication of note.**

## 2016-11-12 NOTE — Telephone Encounter (Signed)
Scheduled appt per 10/5 los - [facility] booked on 10/11 - per MD okay to schedule for 10/10 - left message with appt date and time.

## 2016-11-15 ENCOUNTER — Other Ambulatory Visit: Payer: Self-pay | Admitting: Radiation Oncology

## 2016-11-15 ENCOUNTER — Ambulatory Visit
Admission: RE | Admit: 2016-11-15 | Discharge: 2016-11-15 | Disposition: A | Discharge status: Home / Self Care | Payer: Medicare Other | Source: Ambulatory Visit | Attending: Radiation Oncology | Admitting: Radiation Oncology

## 2016-11-15 DIAGNOSIS — Z510 Encounter for antineoplastic radiation therapy: Secondary | ICD-10-CM | POA: Diagnosis not present

## 2016-11-16 ENCOUNTER — Encounter: Payer: Self-pay | Admitting: Hematology and Oncology

## 2016-11-16 ENCOUNTER — Ambulatory Visit: Payer: Medicare Other

## 2016-11-16 ENCOUNTER — Ambulatory Visit
Admission: RE | Admit: 2016-11-16 | Discharge: 2016-11-16 | Disposition: A | Discharge status: Home / Self Care | Payer: Medicare Other | Source: Ambulatory Visit | Attending: Radiation Oncology | Admitting: Radiation Oncology

## 2016-11-16 DIAGNOSIS — Z510 Encounter for antineoplastic radiation therapy: Secondary | ICD-10-CM | POA: Diagnosis not present

## 2016-11-16 NOTE — Progress Notes (Signed)
[facility] Follow-up Visit:  Assessment: Cancer of base of tongue ([place]) 71 y.o. 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 No evidence of distal metastatic disease at this time based on the initial PET/CT. Patient was not a candidate for cisplatin-based chemotherapy due to pre-existing hearing loss requiring hearing aids.   Patient was started on systemic chemotherapy with combination of weekly carboplatin and paclitaxel for 10/01/16. The course has been complicated by oral thrush, pharyngeal pain requiring opioid medications, opioid-induced constipation so far. At the present time, constipation is under good control. Liquid morphine appears to be controlling pain well, and the patient did not tolerate transdermal fentanyl due to CNS side effects. Anxiety is well controlled on current dose of lorazepam.  Clinical evaluation and lab work are permissive to proceed with the next week of systemic chemotherapy.  Plan: --Continue Lorazepam to 70m PO Q6hrs PRN --proceed with the 6th dose of systemic therapy with carboplatin and paclitaxel --RTC 1 week: labs, clinic visit for toxicity and recovering monitoring  Voice recognition software was used and creation of this note. Despite my best effort at editing the text, some misspelling/errors may have occurred.  Orders Placed This Encounter  Procedures  . CBC with Differential    Standing Status:   Future    Standing Expiration Date:   11/16/2017  . Comprehensive metabolic panel    Standing Status:   Future    Standing Expiration Date:   11/16/2017  . Magnesium    Standing Status:   Future    Standing Expiration Date:   11/16/2017    Cancer Staging Cancer of base of tongue ([place] Staging form: Pharynx - HPV-Mediated  Oropharynx, AJCC 8th Edition - Clinical: Stage II (cT2, cN2, cM0, p16: Positive) - Signed by SEppie Gibson MD on 09/14/2016   All questions were answered.  . The patient knows to call the clinic with any problems, questions or concerns.  This note was electronically signed.    History of Presenting Illness Joel Walton is a 71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 Patient is currently undergoing curative-intent treatment with chemoradiotherapy. He presents to the clinic for possible 4th week of carboplatin and paclitaxel therapy administered concurrently with radiation.  At the present time, patient is requiring oral opioid medications for pain control due to radiation-induced mucositis as well as lorazepam for control of anxiety. Pain control is adequate, anxiety control has improved. Due to progressive mucositis, patient is currently completely dependent on PEG tube for nutrition. Reports increased soreness in the throat, and mouth. No oral bleeding. Patient is scheduled to complete his radiation therapy on Thursday next week.  Oncological/hematological History:   [place]   08/26/2016 Pathology Results    Tongue biopsy confirmed squamous cell carcinoma      08/27/2016 Initial Diagnosis    [place] of [place] of tongue ([place]: positive for p16      09/01/2016 Imaging    CT Neck: Large necrotic an ulcerating lesion at the right base of the tongue measuring 3.9 cm. Pathological lymphadenopathy in the right neck noted.      09/07/2016 Imaging    PET-CT: Large hypermetabolic mass at the right base of the tongue  with extension across the midline. Significant hypermetabolic level II right-sided lymphadenopathy, possible pathological lymphadenopathy on the left side at level II.      09/24/2016 Procedure    Ultrasound and fluoroscopically guided right internal jugular single  lumen power port catheter insertion. Tip in the SVC/RA junction. Catheter ready for use      09/24/2016 Procedure    Fluoroscopic insertion of a 20-French "pull-through" gastrostomy.      09/28/2016 -  Radiation Therapy           10/01/2016 -  Chemotherapy    Carboplatin AUC2 QWk + Paclitaxel 34m/m2 QWk concurrent with radiation therapy --Wk #1, 10/01/16: --Wk #2, 10/08/16: --Wk #3, 10/14/16: --Wk #4, 10/22/16: --Wk #5, 10/29/16: --Wk #6, 11/05/16: skipped due to neutropenia --Wk #7, 11/12/16:       Medical History: Past Medical History:  Diagnosis Date  . Hyperlipidemia   . Hypertension     Surgical History: Past Surgical History:  Procedure Laterality Date  . ELBOW SURGERY Right   . IR FLUORO GUIDE PORT INSERTION RIGHT  09/24/2016  . IR GASTROSTOMY TUBE MOD SED  09/24/2016  . IR [place] V[place] ACCESS RIGHT  09/24/2016  . KNEE SURGERY Bilateral    Not total knee replacements  . SHOULDER ARTHROSCOPY Right     Family History: No family history on file.  Social History: Social History   Social History  . Marital status: Married    Spouse name: N/A  . Number of children: 2  . Years of education: N/A   Occupational History  . Retired      GPersonal assistant  Social History Main Topics  . Smoking status: Former Smoker    Packs/day: 0.50  . Smokeless tobacco: Never Used     Comment: Quit in 1978  . Alcohol use No     Comment: 3-4 beers daily  . Drug use: No  . Sexual activity: Not Currently   Other Topics Concern  . Not on file   Social History Narrative  . No narrative on file    Allergies: No Known Allergies  Medications:  Current Outpatient Prescriptions  Medication Sig Dispense Refill  . dexamethasone (DECADRON) 0.5 MG/5ML solution Take 829m(8088mper tube with food daily through Sept 28, then 4mg17m0mL27mily through Oct 2, then 2mg (74mL) 61my through Oct 6. 500 mL 0  . diphenhydrAMINE (BENYLIN) 12.5 MG/5ML syrup Take 2.5 mLs (6.25 mg  total) by mouth at bedtime as needed for allergies. 120 mL 0  . lidocaine (XYLOCAINE) 2 % solution Patient: Mix 1part 2% viscous lidocaine, 1part H20. Swish & swallow 10mL of75muted mixture, 30min be35m meals and at bedtime, up to QID 100 mL 5  . lidocaine-prilocaine (EMLA) cream Apply to affected area once 30 g 3  . LORazepam (LORAZEPAM INTENSOL) 2 MG/ML concentrated solution Take 0.5 mLs (1 mg total) by mouth every 4 (four) hours as needed for anxiety (nausea). 30 mL 0  . Morphine Sulfate (MORPHINE CONCENTRATE) 10 mg / 0.5 ml concentrated solution Take 0.5 mLs (10 mg total) by mouth every 4 (four) hours as needed for severe pain. 1 Bottle 0  . Multiple Vitamin (MULTIVITAMIN) tablet Take 1 tablet by mouth daily.    . Nutritional Supplements (FEEDING SUPPLEMENT, OSMOLITE 1.5 CAL,) LIQD Give 1.5 bottles Osmolite 1.5 QID with 60 mL free water before and after via PEG. Increase to 2 bottles once daily and continue 1.5 bottles TID as tolerated. Drink or flush PEG with additional  240 mL free water TID. Send supplies and formula. Call daughter, [name], at [phone] to arrange delivery for patient. 1541 mL 0  . omeprazole (PRILOSEC) 20 MG capsule   0  . ondansetron (ZOFRAN) 8 MG tablet Take 1 tablet (8 mg total) by mouth 2 (two) times daily as needed for refractory nausea / vomiting. Start on day 3 after chemo. 30 tablet 1  . predniSONE (DELTASONE) 20 MG tablet 3 tabs poqday 1-2, 2 tabs poqday 3-4, 1 tab poqday 5-6 12 tablet 0  . prochlorperazine (COMPAZINE) 10 MG tablet Take 1 tablet (10 mg total) by mouth every 6 (six) hours as needed (Nausea or vomiting). 30 tablet 1  . scopolamine (TRANSDERM-SCOP) 1 MG/3DAYS Place 1 patch (1.5 mg total) onto the skin every 3 (three) days. For thick secretions. 4 patch 4   No current facility-administered medications for this visit.     Review of Systems: Review of Systems  HENT:   Positive for mouth sores, sore throat and trouble swallowing.   All other systems  reviewed and are negative.    PHYSICAL EXAMINATION Blood pressure 115/75, pulse (!) 103, temperature 99.2 F (37.3 C), temperature source Oral, resp. rate 20, height _0  (1.753 m), weight 167 lb 1.6 oz (75.8 kg), SpO2 99 %.  ECOG PERFORMANCE STATUS: 1 - Symptomatic but completely ambulatory  Physical Exam  Constitutional: He is oriented to person, place, and time and well-developed, well-nourished, and in no distress. No distress.  HENT:  Mouth/Throat: Oropharynx is clear and moist and mucous membranes are normal. No dental abscesses.  Edentulous, wearing dentures. No visible lesion noted no anterior oral ulcerations, leukoplakia. Visible oropharynx without abnormality.  Eyes: Pupils are equal, round, and reactive to light. Conjunctivae and EOM are normal. No scleral icterus.  Neck:  Palpable enlarged and hard lymph nodes in the right neck. He did not appear to be too other soft tissues.  Cardiovascular: Normal rate and regular rhythm.   No murmur heard. Pulmonary/Chest: Effort normal and breath sounds normal. No respiratory distress. He has no wheezes. He has no rales.  Abdominal: Soft. Bowel sounds are normal. He exhibits no distension and no mass. There is no tenderness.  Musculoskeletal: Normal range of motion. He exhibits no edema.  Neurological: He is alert and oriented to person, place, and time. He has normal reflexes. No cranial nerve deficit. Coordination normal.  Skin: Skin is warm and dry. He is not diaphoretic. There is erythema.  Into starting to develop erythema over bilateral upper neck consistent with radiation changes     LABORATORY DATA: I have personally reviewed the data as listed: Appointment on 11/08/2016  Component Date Value Ref Range Status  . Sodium 11/08/2016 137  136 - 145 mEq/L Final  . Potassium 11/08/2016 3.5  3.5 - 5.1 mEq/L Final  . Chloride 11/08/2016 98  98 - 109 mEq/L Final  . CO2 11/08/2016 30* 22 - 29 mEq/L Final  . Glucose 11/08/2016 109   70 - 140 mg/dl Final   Glucose reference range is for nonfasting patients. Fasting glucose reference range is 70- 100.  Marland Kitchen BUN 11/08/2016 15.3  7.0 - 26.0 mg/dL Final  . Creatinine 11/08/2016 0.6* 0.7 - 1.3 mg/dL Final  . Total Bilirubin 11/08/2016 0.29  0.20 - 1.20 mg/dL Final  . Alkaline Phosphatase 11/08/2016 92  40 - 150 U/L Final  . AST 11/08/2016 22  5 - 34 U/L Final  . ALT 11/08/2016 53  0 - 55 U/L Final  . Total  Protein 11/08/2016 6.6  6.4 - 8.3 g/dL Final  . Albumin 11/08/2016 3.0* 3.5 - 5.0 g/dL Final  . Calcium 11/08/2016 9.3  8.4 - 10.4 mg/dL Final  . Anion Gap 11/08/2016 9  3 - 11 mEq/L Final  . EGFR 11/08/2016 >90  >90 ml/min/1.73 m2 Final   eGFR is calculated using the CKD-EPI Creatinine Equation (2009)  . WBC 11/08/2016 3.0* 4.0 - 10.3 10e3/uL Final  . NEUT# 11/08/2016 2.3  1.5 - 6.5 10e3/uL Final  . HGB 11/08/2016 11.3* 13.0 - 17.1 g/dL Final  . HCT 11/08/2016 33.7* 38.4 - 49.9 % Final  . Platelets 11/08/2016 240  140 - 400 10e3/uL Final  . MCV 11/08/2016 93.1  79.3 - 98.0 fL Final  . MCH 11/08/2016 31.3  27.2 - 33.4 pg Final  . MCHC 11/08/2016 33.6  32.0 - 36.0 g/dL Final  . RBC 11/08/2016 3.62* 4.20 - 5.82 10e6/uL Final  . RDW 11/08/2016 15.1* 11.0 - 14.6 % Final  . lymph# 11/08/2016 0.2* 0.9 - 3.3 10e3/uL Final  . MONO# 11/08/2016 0.5  0.1 - 0.9 10e3/uL Final  . Eosinophils Absolute 11/08/2016 0.0  0.0 - 0.5 10e3/uL Final  . Basophils Absolute 11/08/2016 0.0  0.0 - 0.1 10e3/uL Final  . NEUT% 11/08/2016 76.4* 39.0 - 75.0 % Final  . LYMPH% 11/08/2016 7.3* 14.0 - 49.0 % Final  . MONO% 11/08/2016 15.9* 0.0 - 14.0 % Final  . EOS% 11/08/2016 0.3  0.0 - 7.0 % Final  . BASO% 11/08/2016 0.1  0.0 - 2.0 % Final       Ardath Sax, MD

## 2016-11-16 NOTE — Assessment & Plan Note (Signed)
71 y.o. 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 No evidence of distal metastatic disease at this time based on the initial PET/CT. Patient was not a candidate for cisplatin-based chemotherapy due to pre-existing hearing loss requiring hearing aids.   Patient was started on systemic chemotherapy with combination of weekly carboplatin and paclitaxel for 10/01/16. The course has been complicated by oral thrush, pharyngeal pain requiring opioid medications, opioid-induced constipation so far. At the present time, constipation is under good control. Liquid morphine appears to be controlling pain well, and the patient did not tolerate transdermal fentanyl due to CNS side effects. Anxiety is well controlled on current dose of lorazepam.  Clinical evaluation and lab work are permissive to proceed with the next week of systemic chemotherapy.  Plan: --Continue Lorazepam to 1mg  PO Q6hrs PRN --proceed with the 6th dose of systemic therapy with carboplatin and paclitaxel --RTC 1 week: labs, clinic visit for toxicity and recovering monitoring

## 2016-11-17 ENCOUNTER — Ambulatory Visit (HOSPITAL_BASED_OUTPATIENT_CLINIC_OR_DEPARTMENT_OTHER): Payer: Medicare Other

## 2016-11-17 ENCOUNTER — Encounter: Payer: Self-pay | Admitting: Hematology and Oncology

## 2016-11-17 ENCOUNTER — Telehealth: Payer: Self-pay | Admitting: Hematology and Oncology

## 2016-11-17 ENCOUNTER — Ambulatory Visit: Payer: Medicare Other

## 2016-11-17 ENCOUNTER — Telehealth: Payer: Self-pay | Admitting: *Deleted

## 2016-11-17 ENCOUNTER — Ambulatory Visit (HOSPITAL_BASED_OUTPATIENT_CLINIC_OR_DEPARTMENT_OTHER): Payer: Medicare Other | Admitting: Hematology and Oncology

## 2016-11-17 ENCOUNTER — Ambulatory Visit
Admission: RE | Admit: 2016-11-17 | Discharge: 2016-11-17 | Disposition: A | Discharge status: Home / Self Care | Payer: Medicare Other | Source: Ambulatory Visit | Attending: Radiation Oncology | Admitting: Radiation Oncology

## 2016-11-17 VITALS — BP 102/70 | HR 89 | Temp 98.8°F | Resp 18 | Ht 69.0 in | Wt 168.3 lb

## 2016-11-17 DIAGNOSIS — C770 Secondary and unspecified malignant neoplasm of lymph nodes of head, face and neck: Secondary | ICD-10-CM

## 2016-11-17 DIAGNOSIS — C01 Malignant neoplasm of base of tongue: Secondary | ICD-10-CM | POA: Diagnosis not present

## 2016-11-17 DIAGNOSIS — Z510 Encounter for antineoplastic radiation therapy: Secondary | ICD-10-CM | POA: Diagnosis not present

## 2016-11-17 LAB — CBC WITH DIFFERENTIAL/PLATELET
BASO%: 0.4 % (ref 0.0–2.0)
Basophils Absolute: 0 10e3/uL (ref 0.0–0.1)
EOS%: 0.4 % (ref 0.0–7.0)
Eosinophils Absolute: 0 10e3/uL (ref 0.0–0.5)
HCT: 31.4 % — ABNORMAL LOW (ref 38.4–49.9)
HGB: 10.5 g/dL — ABNORMAL LOW (ref 13.0–17.1)
LYMPH%: 13.2 % — ABNORMAL LOW (ref 14.0–49.0)
MCH: 31.6 pg (ref 27.2–33.4)
MCHC: 33.4 g/dL (ref 32.0–36.0)
MCV: 94.6 fL (ref 79.3–98.0)
MONO#: 0.1 10e3/uL (ref 0.1–0.9)
MONO%: 3.9 % (ref 0.0–14.0)
NEUT#: 2.1 10e3/uL (ref 1.5–6.5)
NEUT%: 82.1 % — ABNORMAL HIGH (ref 39.0–75.0)
Platelets: 121 10e3/uL — ABNORMAL LOW (ref 140–400)
RBC: 3.32 10e6/uL — ABNORMAL LOW (ref 4.20–5.82)
RDW: 16 % — ABNORMAL HIGH (ref 11.0–14.6)
WBC: 2.6 10e3/uL — ABNORMAL LOW (ref 4.0–10.3)
lymph#: 0.3 10e3/uL — ABNORMAL LOW (ref 0.9–3.3)

## 2016-11-17 LAB — COMPREHENSIVE METABOLIC PANEL WITH GFR
ALT: 33 U/L (ref 0–55)
AST: 20 U/L (ref 5–34)
Albumin: 3 g/dL — ABNORMAL LOW (ref 3.5–5.0)
Alkaline Phosphatase: 83 U/L (ref 40–150)
Anion Gap: 9 meq/L (ref 3–11)
BUN: 11 mg/dL (ref 7.0–26.0)
CO2: 32 meq/L — ABNORMAL HIGH (ref 22–29)
Calcium: 9.3 mg/dL (ref 8.4–10.4)
Chloride: 96 meq/L — ABNORMAL LOW (ref 98–109)
Creatinine: 0.6 mg/dL — ABNORMAL LOW (ref 0.7–1.3)
EGFR: >60 ml/min/1.73 m2
Glucose: 107 mg/dL (ref 70–140)
Potassium: 4.4 meq/L (ref 3.5–5.1)
Sodium: 137 meq/L (ref 136–145)
Total Bilirubin: 0.54 mg/dL (ref 0.20–1.20)
Total Protein: 6.6 g/dL (ref 6.4–8.3)

## 2016-11-17 LAB — MAGNESIUM: Magnesium: 2.1 mg/dL (ref 1.5–2.5)

## 2016-11-17 MED ORDER — LORAZEPAM 2 MG/ML PO CONC: 1.0000 mg | ORAL | 0 refills | Status: DC | PRN

## 2016-11-17 NOTE — Telephone Encounter (Signed)
Gave avs and calendar for October  °

## 2016-11-17 NOTE — Telephone Encounter (Signed)
Oncology Nurse Navigator Documentation  Received call from patient's dtr asking if today's RT and Dr. Lebron Conners appts could be rescheduled for Friday.  She explained he did not sleep well last HS b/c he has been experiencing nausea and "dry heaves" d/t thickened saliva, has persisted through the day.  He is concerned he will not be able to lie on RT table without gagging.  I encouraged keeping both appts, to bring saltwater rinse to use immediately prior to RT.  If unable to complete RT, he should keep appt with Dr. Lebron Conners.  I informed Tomo RTT and Dr. Clydene Laming RN of situation.  Gayleen Orem, RN, BSN, [place] [facility] Nurse [place] at [place] [phone]

## 2016-11-18 ENCOUNTER — Ambulatory Visit: Admission: RE | Admit: 2016-11-18 | Payer: Medicare Other | Source: Ambulatory Visit

## 2016-11-18 LAB — PHOSPHORUS: Phosphorus, Ser: 4.1 mg/dL (ref 2.5–4.5)

## 2016-11-19 ENCOUNTER — Encounter: Payer: Self-pay | Admitting: *Deleted

## 2016-11-19 ENCOUNTER — Ambulatory Visit
Admission: RE | Admit: 2016-11-19 | Discharge: 2016-11-19 | Disposition: A | Discharge status: Home / Self Care | Payer: Medicare Other | Source: Ambulatory Visit | Attending: Radiation Oncology | Admitting: Radiation Oncology

## 2016-11-19 ENCOUNTER — Telehealth: Payer: Self-pay | Admitting: *Deleted

## 2016-11-19 DIAGNOSIS — Z510 Encounter for antineoplastic radiation therapy: Secondary | ICD-10-CM | POA: Diagnosis not present

## 2016-11-19 NOTE — Telephone Encounter (Signed)
Oncology Nurse Navigator Documentation  Spoke with pt's dtr, informed today's RT scheduled for 1145.  She voiced understanding.  Gayleen Orem, RN, BSN, [place] [facility] Nurse [name] at [employer] [phone]

## 2016-11-19 NOTE — Progress Notes (Signed)
Oncology Nurse Navigator Documentation  Met with pt after final RT to offer support and to celebrate end of radiation treatment.  He was accompanied by his dtr. I provided dtr with a Certificate of Recognition for her supportive care. I provided verbal/written post-RT guidance:  Importance of keeping all follow-up appts, especially those with Nutrition and SLP.  Importance of protecting treatment area from sun.  Continuation of Sonafine application 2-3 times daily until supply exhausted after which transition to OTC lotion with vitamin E. I explained that my role as navigator will continue for several more months and that I will be calling and/or joining him during follow-up visits.   I encouraged them to call me with needs/concerns.   Patient and dtr verbalized understanding of information provided.  Gayleen Orem, RN, BSN, [place] at [place] [phone]

## 2016-11-22 ENCOUNTER — Encounter: Payer: Self-pay | Admitting: Nutrition

## 2016-11-23 NOTE — Assessment & Plan Note (Signed)
71 y.o. 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 No evidence of distal metastatic disease at this time based on the initial PET/CT. Patient was not a candidate for cisplatin-based chemotherapy due to pre-existing hearing loss requiring hearing aids.   Patient was started on systemic chemotherapy with combination of weekly carboplatin and paclitaxel for 10/01/16. Patient has received the last dose of systemic chemotherapy on 11/12/16. He is nearing completion of his radiation therapy over the last treatment scheduled for tomorrow. End of the protracted chemoradiotherapy, patient has significant amount of mucositis as well as dermatitis attributable to the therapy. He is currently reasonably well controlled, but patient is having trouble with nausea and vomiting likely due to combination of pain, pain medications, and effects of the radiation/chemotherapy.   Plan: --Continue Lorazepam to 1mg  PO Q6hrs PRN --continue regular lab work monitoring and supportive care --RTC 1 week: labs, clinic visit for toxicity and recovering monitoring

## 2016-11-23 NOTE — Progress Notes (Signed)
Joel Walton Cancer Follow-up Visit:  Assessment: Cancer of base of tongue ([place]) 71 y.o. 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 No evidence of distal metastatic disease at this time based on the initial PET/CT. Patient was not a candidate for cisplatin-based chemotherapy due to pre-existing hearing loss requiring hearing aids.   Patient was started on systemic chemotherapy with combination of weekly carboplatin and paclitaxel for 10/01/16. Patient has received the last dose of systemic chemotherapy on 11/12/16. He is nearing completion of his radiation therapy over the last treatment scheduled for tomorrow. End of the protracted chemoradiotherapy, patient has significant amount of mucositis as well as dermatitis attributable to the therapy. He is currently reasonably well controlled, but patient is having trouble with nausea and vomiting likely due to combination of pain, pain medications, and effects of the radiation/chemotherapy.   Plan: --Continue Lorazepam to '1mg'$  PO Q6hrs PRN --continue regular lab work monitoring and supportive care --RTC 1 week: labs, clinic visit for toxicity and recovering monitoring  Voice recognition software was used and creation of this note. Despite my best effort at editing the text, some misspelling/errors may have occurred.  Orders Placed This Encounter  Procedures  . CBC with Differential    Standing Status:   Future    Number of Occurrences:   1    Standing Expiration Date:   11/17/2017  . Comprehensive metabolic panel    Standing Status:   Future    Number of Occurrences:   1    Standing Expiration Date:   11/17/2017  . Magnesium    Standing Status:   Future    Number of Occurrences:   1    Standing Expiration Date:    11/17/2017  . Phosphorus    Standing Status:   Future    Number of Occurrences:   1    Standing Expiration Date:   11/17/2017  . CBC with Differential    Standing Status:   Future    Standing Expiration Date:   11/17/2017  . Magnesium    Standing Status:   Future    Standing Expiration Date:   11/17/2017  . Phosphorus    Standing Status:   Future    Standing Expiration Date:   11/17/2017  . Comprehensive metabolic panel    Standing Status:   Future    Standing Expiration Date:   11/17/2017    Cancer Staging Cancer of base of tongue ([place]) Staging form: Pharynx - HPV-Mediated Oropharynx, AJCC 8th Edition - Clinical: Stage II (cT2, cN2, cM0, p16: Positive) - Signed by Eppie Gibson, MD on 09/14/2016   All questions were answered.  . The patient knows to call the clinic with any problems, questions or concerns.  This note was electronically signed.    History of Presenting Illness Joel Walton is a 71 y.o. 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 Patient is currently undergoing curative-intent treatment with chemoradiotherapy. He presents to the clinic for possible 4th week of carboplatin and paclitaxel therapy administered concurrently with radiation.  Patient presents to the clinic after completing his last chemotherapy treatment last week. His last radiation therapy scheduled for tomorrow. Patient continues to have nausea and intermittent vomiting. Continues to have significant pain  in the neck and mouth. No oral bleeding denies any interval fevers, chills, night sweats.   Oncological/hematological History:   Cancer of base of tongue ([place])   08/26/2016 Pathology Results    Tongue biopsy confirmed squamous cell carcinoma      08/27/2016 Initial Diagnosis    Cancer of base of tongue ([place]): positive for p16      09/01/2016 Imaging    CT Neck: Large necrotic an ulcerating lesion at the  right base of the tongue measuring 3.9 cm. Pathological lymphadenopathy in the right neck noted.      09/07/2016 Imaging    PET-CT: Large hypermetabolic mass at the right base of the tongue with extension across the midline. Significant hypermetabolic level II right-sided lymphadenopathy, possible pathological lymphadenopathy on the left side at level II.      09/24/2016 Procedure    Ultrasound and fluoroscopically guided right internal jugular single lumen power port catheter insertion. Tip in the SVC/RA junction. Catheter ready for use      09/24/2016 Procedure    Fluoroscopic insertion of a 20-French "pull-through" gastrostomy.      09/28/2016 -  Radiation Therapy           10/01/2016 -  Chemotherapy    Carboplatin AUC2 QWk + Paclitaxel '45mg'$ /m2 QWk concurrent with radiation therapy --Wk #1, 10/01/16: --Wk #2, 10/08/16: --Wk #3, 10/14/16: --Wk #4, 10/22/16: --Wk #5, 10/29/16: --Wk #6, 11/05/16: skipped due to neutropenia --Wk #7, 11/12/16:       Medical History: Past Medical History:  Diagnosis Date  . Hyperlipidemia   . Hypertension     Surgical History: Past Surgical History:  Procedure Laterality Date  . ELBOW SURGERY Right   . IR FLUORO GUIDE PORT INSERTION RIGHT  09/24/2016  . IR GASTROSTOMY TUBE MOD SED  09/24/2016  . IR US GUIDE VASC ACCESS RIGHT  09/24/2016  . KNEE SURGERY Bilateral    Not total knee replacements  . SHOULDER ARTHROSCOPY Right     Family History: History reviewed. No pertinent family history.  Social History: Social History   Social History  . Marital status: Married    Spouse name: N/A  . Number of children: 2  . Years of education: N/A   Occupational History  . Retired      Personal assistant   Social History Main Topics  . Smoking status: Former Smoker    Packs/day: 0.50  . Smokeless tobacco: Never Used     Comment: Quit in 1978  . Alcohol use No     Comment: 3-4 beers daily  . Drug use: No  . Sexual activity: Not  Currently   Other Topics Concern  . Not on file   Social History Narrative  . No narrative on file    Allergies: No Known Allergies  Medications:  Current Outpatient Prescriptions  Medication Sig Dispense Refill  . dexamethasone (DECADRON) 0.5 MG/5ML solution Take '8mg'$  (14m) per tube with food daily through Sept 28, then '4mg'$  (46m daily through Oct 2, then '2mg'$  (2038mdaily through Oct 6. 500 mL 0  . diphenhydrAMINE (BENYLIN) 12.5 MG/5ML syrup Take 2.5 mLs (6.25 mg total) by mouth at bedtime as needed for allergies. 120 mL 0  . lidocaine (XYLOCAINE) 2 % solution Patient: Mix 1part 2% viscous lidocaine, 1part H20. Swish & swallow 75m34m diluted mixture, 30mi39mfore meals and at bedtime, up to QID 100 mL 5  . lidocaine-prilocaine (EMLA) cream Apply to affected area once 30 g 3  . LORazepam (LORAZEPAM  INTENSOL) 2 MG/ML concentrated solution Take 0.5 mLs (1 mg total) by mouth every 4 (four) hours as needed for anxiety (nausea). 30 mL 0  . Morphine Sulfate (MORPHINE CONCENTRATE) 10 mg / 0.5 ml concentrated solution Take 0.5 mLs (10 mg total) by mouth every 4 (four) hours as needed for severe pain. 1 Bottle 0  . Multiple Vitamin (MULTIVITAMIN) tablet Take 1 tablet by mouth daily.    . Nutritional Supplements (FEEDING SUPPLEMENT, OSMOLITE 1.5 CAL,) LIQD Give 1.5 bottles Osmolite 1.5 QID with 60 mL free water before and after via PEG. Increase to 2 bottles once daily and continue 1.5 bottles TID as tolerated. Drink or flush PEG with additional 240 mL free water TID. Send supplies and formula. Call daughter, Asher Muir, at [phone] to arrange delivery for patient. 1541 mL 0  . omeprazole (PRILOSEC) 20 MG capsule   0  . ondansetron (ZOFRAN) 8 MG tablet Take 1 tablet (8 mg total) by mouth 2 (two) times daily as needed for refractory nausea / vomiting. Start on day 3 after chemo. 30 tablet 1  . predniSONE (DELTASONE) 20 MG tablet 3 tabs poqday 1-2, 2 tabs poqday 3-4, 1 tab poqday 5-6 12 tablet 0  .  prochlorperazine (COMPAZINE) 10 MG tablet Take 1 tablet (10 mg total) by mouth every 6 (six) hours as needed (Nausea or vomiting). 30 tablet 1  . scopolamine (TRANSDERM-SCOP) 1 MG/3DAYS Place 1 patch (1.5 mg total) onto the skin every 3 (three) days. For thick secretions. 4 patch 4   No current facility-administered medications for this visit.     Review of Systems: Review of Systems  HENT:   Positive for mouth sores, sore throat and trouble swallowing.   Gastrointestinal: Positive for nausea and vomiting.  All other systems reviewed and are negative.    PHYSICAL EXAMINATION Blood pressure 102/70, pulse 89, temperature 98.8 F (37.1 C), temperature source Oral, resp. rate 18, height 5\' 9"  (1.753 m), weight 168 lb 4.8 oz (76.3 kg), SpO2 98 %.  ECOG PERFORMANCE STATUS: 1 - Symptomatic but completely ambulatory  Physical Exam  Constitutional: He is oriented to person, place, and time and well-developed, well-nourished, and in no distress. No distress.  HENT:  Mouth/Throat: Oropharynx is clear and moist and mucous membranes are normal. No dental abscesses.  Edentulous, wearing dentures. No visible lesion noted no anterior oral ulcerations, leukoplakia. Visible oropharynx without abnormality.  Eyes: Pupils are equal, round, and reactive to light. Conjunctivae and EOM are normal. No scleral icterus.  Neck:  Significant reduction in the bulk of the cervical lymph nodes as well as softening of the texture compared to initial presentation.  Cardiovascular: Normal rate and regular rhythm.   No murmur heard. Pulmonary/Chest: Effort normal and breath sounds normal. No respiratory distress. He has no wheezes. He has no rales.  Abdominal: Soft. Bowel sounds are normal. He exhibits no distension and no mass. There is no tenderness.  Musculoskeletal: Normal range of motion. He exhibits no edema.  Neurological: He is alert and oriented to person, place, and time. He has normal reflexes. No cranial  nerve deficit. Coordination normal.  Skin: Skin is warm and dry. He is not diaphoretic. There is erythema.  Progressive radiation erythema lateral neck with mild desquamation, but no blistering.     LABORATORY DATA: I have personally reviewed the data as listed: Appointment on 11/17/2016  Component Date Value Ref Range Status  . WBC 11/17/2016 2.6* 4.0 - 10.3 10e3/uL Final  . NEUT# 11/17/2016 2.1  1.5 -  6.5 10e3/uL Final  . HGB 11/17/2016 10.5* 13.0 - 17.1 g/dL Final  . HCT 11/17/2016 31.4* 38.4 - 49.9 % Final  . Platelets 11/17/2016 121* 140 - 400 10e3/uL Final  . MCV 11/17/2016 94.6  79.3 - 98.0 fL Final  . MCH 11/17/2016 31.6  27.2 - 33.4 pg Final  . MCHC 11/17/2016 33.4  32.0 - 36.0 g/dL Final  . RBC 11/17/2016 3.32* 4.20 - 5.82 10e6/uL Final  . RDW 11/17/2016 16.0* 11.0 - 14.6 % Final  . lymph# 11/17/2016 0.3* 0.9 - 3.3 10e3/uL Final  . MONO# 11/17/2016 0.1  0.1 - 0.9 10e3/uL Final  . Eosinophils Absolute 11/17/2016 0.0  0.0 - 0.5 10e3/uL Final  . Basophils Absolute 11/17/2016 0.0  0.0 - 0.1 10e3/uL Final  . NEUT% 11/17/2016 82.1* 39.0 - 75.0 % Final  . LYMPH% 11/17/2016 13.2* 14.0 - 49.0 % Final  . MONO% 11/17/2016 3.9  0.0 - 14.0 % Final  . EOS% 11/17/2016 0.4  0.0 - 7.0 % Final  . BASO% 11/17/2016 0.4  0.0 - 2.0 % Final  . Sodium 11/17/2016 137  136 - 145 mEq/L Final  . Potassium 11/17/2016 4.4  3.5 - 5.1 mEq/L Final  . Chloride 11/17/2016 96* 98 - 109 mEq/L Final  . CO2 11/17/2016 32* 22 - 29 mEq/L Final  . Glucose 11/17/2016 107  70 - 140 mg/dl Final   Glucose reference range is for nonfasting patients. Fasting glucose reference range is 70- 100.  Marland Kitchen BUN 11/17/2016 11.0  7.0 - 26.0 mg/dL Final  . Creatinine 11/17/2016 0.6* 0.7 - 1.3 mg/dL Final  . Total Bilirubin 11/17/2016 0.54  0.20 - 1.20 mg/dL Final  . Alkaline Phosphatase 11/17/2016 83  40 - 150 U/L Final  . AST 11/17/2016 20  5 - 34 U/L Final  . ALT 11/17/2016 33  0 - 55 U/L Final  . Total Protein 11/17/2016 6.6   6.4 - 8.3 g/dL Final  . Albumin 11/17/2016 3.0* 3.5 - 5.0 g/dL Final  . Calcium 11/17/2016 9.3  8.4 - 10.4 mg/dL Final  . Anion Gap 11/17/2016 9  3 - 11 mEq/L Final  . EGFR 11/17/2016 >60  >60 ml/min/1.73 m2 Final   eGFR is calculated using the CKD-EPI Creatinine Equation (2009)  . Magnesium 11/17/2016 2.1  1.5 - 2.5 mg/dl Final  . Phosphorus, Ser 11/17/2016 4.1  2.5 - 4.5 mg/dL Final       Ardath Sax, MD

## 2016-11-24 ENCOUNTER — Ambulatory Visit (HOSPITAL_BASED_OUTPATIENT_CLINIC_OR_DEPARTMENT_OTHER): Payer: Medicare Other | Admitting: Hematology and Oncology

## 2016-11-24 ENCOUNTER — Other Ambulatory Visit (HOSPITAL_BASED_OUTPATIENT_CLINIC_OR_DEPARTMENT_OTHER): Payer: Medicare Other

## 2016-11-24 ENCOUNTER — Ambulatory Visit: Payer: Medicare Other | Admitting: Nutrition

## 2016-11-24 ENCOUNTER — Encounter: Payer: Self-pay | Admitting: Hematology and Oncology

## 2016-11-24 VITALS — BP 111/66 | HR 95 | Temp 99.4°F | Resp 18 | Ht 69.0 in | Wt 163.3 lb

## 2016-11-24 DIAGNOSIS — C770 Secondary and unspecified malignant neoplasm of lymph nodes of head, face and neck: Secondary | ICD-10-CM | POA: Diagnosis not present

## 2016-11-24 DIAGNOSIS — C01 Malignant neoplasm of base of tongue: Secondary | ICD-10-CM | POA: Diagnosis not present

## 2016-11-24 LAB — CBC WITH DIFFERENTIAL/PLATELET
BASO%: 0.4 % (ref 0.0–2.0)
BASOS ABS: 0 10*3/uL (ref 0.0–0.1)
EOS ABS: 0 10*3/uL (ref 0.0–0.5)
EOS%: 0.9 % (ref 0.0–7.0)
HEMATOCRIT: 32.6 % — AB (ref 38.4–49.9)
HEMOGLOBIN: 10.8 g/dL — AB (ref 13.0–17.1)
LYMPH#: 0.4 10*3/uL — AB (ref 0.9–3.3)
LYMPH%: 16.1 % (ref 14.0–49.0)
MCH: 31.7 pg (ref 27.2–33.4)
MCHC: 33.1 g/dL (ref 32.0–36.0)
MCV: 95.6 fL (ref 79.3–98.0)
MONO#: 0.2 10*3/uL (ref 0.1–0.9)
MONO%: 9 % (ref 0.0–14.0)
NEUT#: 1.6 10*3/uL (ref 1.5–6.5)
NEUT%: 73.6 % (ref 39.0–75.0)
PLATELETS: 107 10*3/uL — AB (ref 140–400)
RBC: 3.41 10*6/uL — ABNORMAL LOW (ref 4.20–5.82)
RDW: 16.3 % — AB (ref 11.0–14.6)
WBC: 2.2 10*3/uL — ABNORMAL LOW (ref 4.0–10.3)

## 2016-11-24 LAB — COMPREHENSIVE METABOLIC PANEL
ALBUMIN: 2.9 g/dL — AB (ref 3.5–5.0)
ALK PHOS: 88 U/L (ref 40–150)
ALT: 47 U/L (ref 0–55)
AST: 29 U/L (ref 5–34)
Anion Gap: 6 mEq/L (ref 3–11)
BILIRUBIN TOTAL: 0.52 mg/dL (ref 0.20–1.20)
BUN: 14 mg/dL (ref 7.0–26.0)
CALCIUM: 9.2 mg/dL (ref 8.4–10.4)
CO2: 32 mEq/L — ABNORMAL HIGH (ref 22–29)
Chloride: 98 mEq/L (ref 98–109)
Creatinine: 0.7 mg/dL (ref 0.7–1.3)
GLUCOSE: 169 mg/dL — AB (ref 70–140)
Potassium: 4.5 mEq/L (ref 3.5–5.1)
SODIUM: 137 meq/L (ref 136–145)
TOTAL PROTEIN: 6.7 g/dL (ref 6.4–8.3)

## 2016-11-24 LAB — MAGNESIUM: Magnesium: 2 mg/dl (ref 1.5–2.5)

## 2016-11-24 MED ORDER — LORAZEPAM 2 MG/ML PO CONC: 1.0000 mg | ORAL | 0 refills | Status: DC | PRN

## 2016-11-24 MED ORDER — N-ACETYL-L-CYSTEINE POWD: 600.0000 mg | Freq: Two times a day (BID) | 0 refills | Status: DC | PRN

## 2016-11-24 MED FILL — LORazepam 2 MG/ML CONC: 2 | 10 days supply | Qty: 30 | Fill #0

## 2016-11-24 MED FILL — NAC 600 MG CAPS: 600 | 100 days supply | Qty: 200 | Fill #0

## 2016-11-24 NOTE — Progress Notes (Signed)
Nutrition follow-up completed with patient status post chemoradiation therapy for tongue cancer. Today's weight was documented as 163.4 pounds, decreased from 168.3 pounds October 10. Patient denies nausea, vomiting, constipation, and diarrhea. Complains of very thick saliva which causes him to have dry heaves. Patient uses his feeding tube exclusively for nutrition and hydration. Patient only tolerating 4 bottles of Osmolite 1.5 because he is trying to put additional water in his feeding tube.  Labs pending.  Estimated energy needs: 2200-2400 calories, 90-100 grams protein, 2.4 L fluid.  6 bottles Osmolite 1.5 provides 2130 cal, 9.4 g protein, 1080 mL of free water.  Nutrition diagnosis: Inadequate oral intake continues.  Intervention: Educated patient to give one bottle Osmolite 1.5 with 60 mL of free water.  5-6 times a day via PEG approximately 3 hours apart. Encouraged patient to try to give 240 cc of free water between bolus feedings. Consider IV fluids for patient to help with adequate hydration, to allow patient opportunity to increase tube feeding to meet calorie and protein, goals Questions were answered.  Teach back method used.  Monitoring, evaluation, goals: Patient will tolerate increase Osmolite 1.5 to meet 90% of estimated needs. Patient will increase fluids via PEG/IV fluids for increased hydration.  Next visit: I will contact patient by phone or in person as needed.  **Disclaimer: This note was dictated with voice recognition software. Similar sounding words can inadvertently be transcribed and this note may contain transcription errors which may not have been corrected upon publication of note.**

## 2016-11-25 ENCOUNTER — Telehealth: Payer: Self-pay | Admitting: Hematology and Oncology

## 2016-11-25 LAB — PHOSPHORUS: PHOSPHORUS: 3.4 mg/dL (ref 2.5–4.5)

## 2016-11-25 NOTE — Telephone Encounter (Signed)
Scheduled appt per 10/17 los - left message with appt date and time and sent reminder letter in the mail.

## 2016-11-26 ENCOUNTER — Encounter: Payer: Self-pay | Admitting: Radiation Oncology

## 2016-11-26 NOTE — Progress Notes (Signed)
  Radiation Oncology         [phone] ________________________________  Name: Joel Walton MRN: [id number]  Date: 11/26/2016  DOB: Jun 30, 1945  End of Treatment Note  Diagnosis:   Cancer Staging Cancer of base of tongue ([place]) Staging form: Pharynx - HPV-Mediated Oropharynx, AJCC 8th Edition - Clinical: Stage II (cT2, cN2, cM0, p16: Positive) - Signed by Eppie Gibson, MD on 09/14/2016  Indication for treatment:  Curative       Radiation treatment dates:   09/28/16-11/19/16  Site/dose:   Bilateral neck + BOT, 70 Gy total in 35 fx  Beams/energy:  IMRT/6X  Narrative: The patient tolerated radiation treatment relatively well.  Throughout his treatment Aitan developed several clinical symptoms, principally a throat burning. He was taking morphine 20mg  for this with adequate control. He also developed some thick secretions, for which he was using scopolamine patches which helped with this. He also developed radiation-related skin changes to his neck which he used Neosporin and Biafine for. Around the end of his treatments he was instilling 6 cans of osmolite daily thru his PEG tube.   Plan: The patient has completed radiation treatment. The patient will return to radiation oncology clinic for routine followup in one half month. I advised them to call or return sooner if they have any questions or concerns related to their recovery or treatment.  -----------------------------------  Eppie Gibson, MD  This document serves as a record of services personally performed by Eppie Gibson, MD. It was created on her behalf by Reola Mosher, a trained medical scribe. The creation of this record is based on the scribe's personal observations and the provider's statements to them. This document has been checked and approved by the attending provider.

## 2016-11-29 ENCOUNTER — Ambulatory Visit: Payer: Medicare Other

## 2016-11-29 DIAGNOSIS — R1310 Dysphagia, unspecified: Secondary | ICD-10-CM

## 2016-11-29 NOTE — Therapy (Signed)
[facility] [address] [place], [place], [address] Phone: [phone]   Fax:  [phone]  Speech Language Pathology Treatment  Patient Details  Name: Joel Walton MRN: [id number] Date of Birth: September 04, 1945 Referring Provider: Eppie Gibson, MD  Encounter Date: 11/29/2016      End of Session - 11/29/16 1515    Visit Number 2   Number of Visits 3   Date for SLP Re-Evaluation 12/13/16   Authorization Type Blue MCR - requested today   Authorization Time Period 10-05-16 to 04-06-17   Authorization - Visit Number 2   Authorization - Number of Visits 6   SLP Start Time 6606   SLP Stop Time  1400   SLP Time Calculation (min) 42 min   Activity Tolerance Patient tolerated treatment well      Past Medical History:  Diagnosis Date  . Hyperlipidemia   . Hypertension     Past Surgical History:  Procedure Laterality Date  . ELBOW SURGERY Right   . IR FLUORO GUIDE PORT INSERTION RIGHT  09/24/2016  . IR GASTROSTOMY TUBE MOD SED  09/24/2016  . IR US GUIDE VASC ACCESS RIGHT  09/24/2016  . KNEE SURGERY Bilateral    Not total knee replacements  . SHOULDER ARTHROSCOPY Right     There were no vitals filed for this visit.      Subjective Assessment - 11/29/16 1323    Subjective "I'm trying to do swallowing but it's just not going well." Pt expectorating into garbage.                ADULT SLP TREATMENT - 11/29/16 1325      General Information   Behavior/Cognition Alert;Cooperative;Pleasant mood     Treatment Provided   Treatment provided Dysphagia     Dysphagia Treatment   Temperature Spikes Noted No  no other overt s/s aspiration PNA reported   Oral Cavity - Dentition Dentures, top;Dentures, bottom   Treatment Methods Skilled observation;Patient/caregiver education   Patient observed directly with PO's No  politely refused   Pharyngeal Phase Signs & Symptoms Wet vocal quality   Other  treatment/comments Last day of radiation 11-18-16, pt without POs for 3 weeks. Pt has not been compliant with frequency or regimen of exercises saying, "I have to find that paper - its in a stack somewhere," SLP reiterated need to complete exercises BID, adn rationale for HEP. SLP also reminded pt/daughter to complete non-swallowing exercises, and to complete one rep of each swallowing exercise, then full reps of non-swallowing exercise - do this until pt has completed one rep/some reps of each of the swallowing exercises, and to incr number of reps as tolerated. Pt swallowed saliva today with pain at 8/10 and wincing. SLP educated pt re: food journal and pt told SLP how he could use it when he returns to POs. Pt req'd mod A occasionally for non-swallowing exercises. Pt watched SLP complete examples of swallowing exercises.     Pain Assessment   Pain Assessment 0-10   Pain Score 8    Pain Location throat   Pain Descriptors / Indicators Burning  "when I'm swallowing"     Assessment / Recommendations / Plan   Plan Continue with current plan of care     Progression Toward Goals   Progression toward goals Progressing toward goals          SLP Education - 11/29/16 1637    Education provided Yes   Education Details late effects head/neck cancer,  ramificaitons of not completing HEP, alterations for HEP for diffuse pain when swallowing   Person(s) Educated Patient;Child(ren)   Methods Explanation;Demonstration;Handout   Comprehension Verbalized understanding;Returned demonstration          SLP Short Term Goals - 11/29/16 1640      SLP SHORT TERM GOAL #1   Title pt will complete HEP with rare min A   Status Not Met     SLP SHORT TERM GOAL #2   Title pt will tell SLP why he is completing HEP    Status Achieved          SLP Long Term Goals - 11/29/16 1641      SLP LONG TERM GOAL #1   Title pt will tell SLP 3 overt s/s aspiration PNA with modified independence   Time 1   Period  --  visits   Status On-going     SLP LONG TERM GOAL #2   Title pt will complete HEP with modified independence over two sessions    Time 1   Period --  vistis   Status On-going     SLP LONG TERM GOAL #3   Title pt will tell SLP why a food journal is helpful in returning to most liberal diet   Status Achieved          Plan - 11/29/16 1638    Clinical Impression Statement Pt with oropharyngeal swallowing unable to be fully assessed today due to pt politely refusing POs due to pain. Pt has been noncompliant with HEP frequency and regimen of exercises. SEe "skilled intervention" for more details. The probability of further swallowing difficulty increases dramatically with the initiation of chemo and radiation therapy. Pt will need to be followed by SLP for regular assessment of accurate HEP completion as well as for safety with POs both during and following treatment/s.   Speech Therapy Frequency --  once approx every four weeks   Duration --  two visits/6-7 total visits   Treatment/Interventions Aspiration precaution training;Pharyngeal strengthening exercises;Diet toleration management by SLP;Compensatory techniques;Internal/external aids;SLP instruction and feedback;Patient/family education;Oral motor exercises;Trials of upgraded texture/liquids;Environmental controls  any or any combination may be used   Potential to Achieve Goals Good   SLP Home Exercise Plan provided today   Consulted and Agree with Plan of Care Patient      Patient will benefit from skilled therapeutic intervention in order to improve the following deficits and impairments:   Dysphagia, unspecified type    Problem List Patient Active Problem List   Diagnosis Date Noted  . Elevated liver enzymes 10/07/2016  . Thrush, oral 09/29/2016  . 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 Other constipation 09/29/2016  . Mild protein malnutrition ([place]) 09/29/2016  . Dysphagia 09/29/2016  . 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 Cancer of base of tongue ([place]) 09/14/2016    [facility] ,[place], [place]  11/29/2016, 4:41 PM  [facility] [address] [place], [place], [address] Phone: [phone]   Fax:  [phone]   Name: Joel Walton MRN: [id number] Date of Birth: 1946/01/09

## 2016-11-29 NOTE — Patient Instructions (Signed)
SWALLOWING EXERCISES Do these until 6 months after your last day of radiation, then 2-3 times per week afterwards  1. Effortful Swallows - Press your tongue against the roof of your mouth for 3 seconds, then squeeze          the muscles in your neck while you swallow your saliva or a sip of water - Repeat 20 times, 2-3 times a day, and use whenever you eat or drink  2. Masako Swallow - swallow with your tongue sticking out - Stick tongue out past your teeth and gently bite tongue with your teeth - Swallow, while holding your tongue with your teeth - Repeat 20 times, 2-3 times a day *use a wet spoon if your mouth gets dry*  3. Pitch Raise (optional) - Repeat "he", once per second in as high of a pitch as you can - Repeat 20 times, 2-3 times a day  4. Shaker Exercise - head lift - Lie flat on your back in your bed or on a couch without pillows - Raise your head and look at your feet - KEEP YOUR SHOULDERS DOWN - HOLD FOR 45-60 SECONDS, then lower your head back down - Repeat 3 times, 2-3 times a day  5. Mendelsohn Maneuver - "half swallow" exercise - Start to swallow, and keep your Adam's apple up by squeezing hard with the            muscles of the throat - Hold the squeeze for 5-7 seconds and then relax - Repeat 20 times, 2-3 times a day *use a wet spoon if your mouth gets dry*  6. Breath Hold - Say "HUH!" loudly, then hold your breath for 3 seconds at your voice box - Repeat 20 times, 2-3 times a day  7. Chin pushback - Open your mouth  - Place your fist UNDER your chin near your neck, and push back with your fist for 5 seconds - Repeat 10 times, 2-3 times a day

## 2016-11-30 ENCOUNTER — Encounter: Payer: Self-pay | Admitting: Radiation Oncology

## 2016-11-30 NOTE — Progress Notes (Signed)
error 

## 2016-11-30 NOTE — Assessment & Plan Note (Signed)
71 y.o. 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 No evidence of distal metastatic disease at this time based on the initial PET/CT. Patient was not a candidate for cisplatin-based chemotherapy due to pre-existing hearing loss requiring hearing aids.   Patient was started on systemic chemotherapy with combination of weekly carboplatin and paclitaxel for 10/01/16. Patient has completed his therapy, but continues to have expected side effects with thick, hard to clear secretions being the  Dominant one at this time.  Scopolamine produced intolerable side effects and patient discontinued the patch.  Plan: --Continue Lorazepam to 1mg  PO Q6hrs PRN --Start Acetylcysteine pwdr 600mg  PO swish/spit BID --continue regular lab work monitoring and supportive care --RTC 1 week: labs, clinic visit for toxicity and recovering monitoring

## 2016-11-30 NOTE — Progress Notes (Signed)
[facility] Follow-up Visit:  Assessment: Cancer of base of tongue ([place]) 71 y.o. 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 No evidence of distal metastatic disease at this time based on the initial PET/CT. Patient was not a candidate for cisplatin-based chemotherapy due to pre-existing hearing loss requiring hearing aids.   Patient was started on systemic chemotherapy with combination of weekly carboplatin and paclitaxel for 10/01/16. Patient has completed his therapy, but continues to have expected side effects with thick, hard to clear secretions being the  Dominant one at this time.  Scopolamine produced intolerable side effects and patient discontinued the patch.  Plan: --Continue Lorazepam to '1mg'$  PO Q6hrs PRN --Start Acetylcysteine pwdr '600mg'$  PO swish/spit BID --continue regular lab work monitoring and supportive care --RTC 1 week: labs, clinic visit for toxicity and recovering monitoring  Voice recognition software was used and creation of this note. Despite my best effort at editing the text, some misspelling/errors may have occurred.  Orders Placed This Encounter  Procedures  . CBC with Differential    Standing Status:   Future    Standing Expiration Date:   11/24/2017  . Comprehensive metabolic panel    Standing Status:   Future    Standing Expiration Date:   11/24/2017  . Magnesium    Standing Status:   Future    Standing Expiration Date:   11/24/2017    Cancer Staging Cancer of base of tongue ([place]) Staging form: Pharynx - HPV-Mediated Oropharynx, AJCC 8th Edition - Clinical: Stage II (cT2, cN2, cM0, p16: Positive) - Signed by Eppie Gibson, MD on 09/14/2016   All questions were answered.  . The patient knows to call the clinic with any  problems, questions or concerns.  This note was electronically signed.    History of Presenting Illness Joel Walton is a 71 y.o. 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 Patient is currently undergoing curative-intent treatment with chemoradiotherapy. He presents to the clinic for possible 4th week of carboplatin and paclitaxel therapy administered concurrently with radiation.  Patient presents to the clinic after completing chemoradiotherapy treatment. Continues to have significant difficulties with clearing thick secretions.  Reports postnasal drip with mucus getting stuck in the upper throat resulting gagging, cough, and occasional emesis.  Patient has lost 5 pound due to difficulties with the symptoms.  Of pain and anxiety control is adequate at this time.  Patient was unable to  Tolerate scopolamine patch due to lightheadedness and dizziness.   Oncological/hematological History:   Cancer of base of tongue ([place])   08/26/2016 Pathology Results    Tongue biopsy confirmed squamous cell carcinoma      08/27/2016 Initial Diagnosis    Cancer of base of tongue ([place]): positive for p16      09/01/2016 Imaging    CT Neck: Large necrotic an ulcerating lesion at the right base of the tongue measuring 3.9 cm. Pathological lymphadenopathy in the right neck noted.      09/07/2016 Imaging    PET-CT: Large hypermetabolic mass at the right base of the tongue with extension across the midline. Significant hypermetabolic level II right-sided lymphadenopathy, possible pathological lymphadenopathy on the left side at level II.      09/24/2016 Procedure  Ultrasound and fluoroscopically guided right internal jugular single lumen power port catheter insertion. Tip in the SVC/RA junction. Catheter ready for use      09/24/2016 Procedure    Fluoroscopic insertion of a 20-French "pull-through" gastrostomy.       09/28/2016 -  Radiation Therapy           10/01/2016 -  Chemotherapy    Carboplatin AUC2 QWk + Paclitaxel '45mg'$ /m2 QWk concurrent with radiation therapy --Wk #1, 10/01/16: --Wk #2, 10/08/16: --Wk #3, 10/14/16: --Wk #4, 10/22/16: --Wk #5, 10/29/16: --Wk #6, 11/05/16: skipped due to neutropenia --Wk #7, 11/12/16:       Medical History: Past Medical History:  Diagnosis Date  . History of radiation therapy 09/28/16- 11/19/16   head and neck, base of tongue 70 Gy total in 35 fractions  . Hyperlipidemia   . Hypertension     Surgical History: Past Surgical History:  Procedure Laterality Date  . ELBOW SURGERY Right   . IR FLUORO GUIDE PORT INSERTION RIGHT  09/24/2016  . IR GASTROSTOMY TUBE MOD SED  09/24/2016  . IR US GUIDE VASC ACCESS RIGHT  09/24/2016  . KNEE SURGERY Bilateral    Not total knee replacements  . SHOULDER ARTHROSCOPY Right     Family History: No family history on file.  Social History: Social History   Social History  . Marital status: Married    Spouse name: N/A  . Number of children: 2  . Years of education: N/A   Occupational History  . Retired      Personal assistant   Social History Main Topics  . Smoking status: Former Smoker    Packs/day: 0.50  . Smokeless tobacco: Never Used     Comment: Quit in 1978  . Alcohol use No     Comment: 3-4 beers daily  . Drug use: No  . Sexual activity: Not Currently   Other Topics Concern  . Not on file   Social History Narrative  . No narrative on file    Allergies: No Known Allergies  Medications:  Current Outpatient Prescriptions  Medication Sig Dispense Refill  . dexamethasone (DECADRON) 0.5 MG/5ML solution Take '8mg'$  (2m) per tube with food daily through Sept 28, then '4mg'$  (434m daily through Oct 2, then '2mg'$  (2036mdaily through Oct 6. 500 mL 0  . diphenhydrAMINE (BENYLIN) 12.5 MG/5ML syrup Take 2.5 mLs (6.25 mg total) by mouth at bedtime as needed for allergies. 120 mL 0  . lidocaine  (XYLOCAINE) 2 % solution Patient: Mix 1part 2% viscous lidocaine, 1part H20. Swish & swallow 22m22m diluted mixture, 30mi70mfore meals and at bedtime, up to QID 100 mL 5  . lidocaine-prilocaine (EMLA) cream Apply to affected area once 30 g 3  . LORazepam (LORAZEPAM INTENSOL) 2 MG/ML concentrated solution Take 0.5 mLs (1 mg total) by mouth every 4 (four) hours as needed for anxiety (nausea). 30 mL 0  . Morphine Sulfate (MORPHINE CONCENTRATE) 10 mg / 0.5 ml concentrated solution Take 0.5 mLs (10 mg total) by mouth every 4 (four) hours as needed for severe pain. 1 Bottle 0  . Acetylcysteine (N-ACETYL-L-CYSTEINE) POWD Swish and spit 600 mg 2 (two) times daily as needed. 2 Bottle 0  . Multiple Vitamin (MULTIVITAMIN) tablet Take 1 tablet by mouth daily.    . Nutritional Supplements (FEEDING SUPPLEMENT, OSMOLITE 1.5 CAL,) LIQD Give 1.5 bottles Osmolite 1.5 QID with 60 mL free water before and after via PEG. Increase to 2 bottles once daily and continue 1.5  bottles TID as tolerated. Drink or flush PEG with additional 240 mL free water TID. Send supplies and formula. Call daughter, [name], at [phone] to arrange delivery for patient. 1541 mL 0  . omeprazole (PRILOSEC) 20 MG capsule   0  . ondansetron (ZOFRAN) 8 MG tablet Take 1 tablet (8 mg total) by mouth 2 (two) times daily as needed for refractory nausea / vomiting. Start on day 3 after chemo. 30 tablet 1  . predniSONE (DELTASONE) 20 MG tablet 3 tabs poqday 1-2, 2 tabs poqday 3-4, 1 tab poqday 5-6 12 tablet 0  . prochlorperazine (COMPAZINE) 10 MG tablet Take 1 tablet (10 mg total) by mouth every 6 (six) hours as needed (Nausea or vomiting). 30 tablet 1   No current facility-administered medications for this visit.     Review of Systems: Review of Systems  HENT:   Positive for mouth sores, sore throat and trouble swallowing.   Gastrointestinal: Positive for nausea and vomiting.  All other systems reviewed and are negative.    PHYSICAL  EXAMINATION Blood pressure 111/66, pulse 95, temperature 99.4 F (37.4 C), temperature source Oral, resp. rate 18, height '5\' 9"'$  (1.753 m), weight 163 lb 4.8 oz (74.1 kg), SpO2 100 %.  ECOG PERFORMANCE STATUS: 1 - Symptomatic but completely ambulatory  Physical Exam  Constitutional: He is oriented to person, place, and time and well-developed, well-nourished, and in no distress. No distress.  HENT:  Mouth/Throat: Oropharynx is clear and moist and mucous membranes are normal. No dental abscesses.  Edentulous, wearing dentures. No visible lesion noted no anterior oral ulcerations, leukoplakia. Visible oropharynx without abnormality.  Eyes: Pupils are equal, round, and reactive to light. Conjunctivae and EOM are normal. No scleral icterus.  Neck:  Significant reduction in the bulk of the cervical lymph nodes as well as softening of the texture compared to initial presentation.  Cardiovascular: Normal rate and regular rhythm.   No murmur heard. Pulmonary/Chest: Effort normal and breath sounds normal. No respiratory distress. He has no wheezes. He has no rales.  Abdominal: Soft. Bowel sounds are normal. He exhibits no distension and no mass. There is no tenderness.  Musculoskeletal: Normal range of motion. He exhibits no edema.  Neurological: He is alert and oriented to person, place, and time. He has normal reflexes. No cranial nerve deficit. Coordination normal.  Skin: Skin is warm and dry. He is not diaphoretic. There is erythema.  Progressive radiation erythema lateral neck with mild desquamation, but no blistering.     LABORATORY DATA: I have personally reviewed the data as listed: Appointment on 11/24/2016  Component Date Value Ref Range Status  . WBC 11/24/2016 2.2* 4.0 - 10.3 10e3/uL Final  . NEUT# 11/24/2016 1.6  1.5 - 6.5 10e3/uL Final  . HGB 11/24/2016 10.8* 13.0 - 17.1 g/dL Final  . HCT 11/24/2016 32.6* 38.4 - 49.9 % Final  . Platelets 11/24/2016 107* 140 - 400 10e3/uL Final  .  MCV 11/24/2016 95.6  79.3 - 98.0 fL Final  . MCH 11/24/2016 31.7  27.2 - 33.4 pg Final  . MCHC 11/24/2016 33.1  32.0 - 36.0 g/dL Final  . RBC 11/24/2016 3.41* 4.20 - 5.82 10e6/uL Final  . RDW 11/24/2016 16.3* 11.0 - 14.6 % Final  . lymph# 11/24/2016 0.4* 0.9 - 3.3 10e3/uL Final  . MONO# 11/24/2016 0.2  0.1 - 0.9 10e3/uL Final  . Eosinophils Absolute 11/24/2016 0.0  0.0 - 0.5 10e3/uL Final  . Basophils Absolute 11/24/2016 0.0  0.0 - 0.1 10e3/uL Final  .  NEUT% 11/24/2016 73.6  39.0 - 75.0 % Final  . LYMPH% 11/24/2016 16.1  14.0 - 49.0 % Final  . MONO% 11/24/2016 9.0  0.0 - 14.0 % Final  . EOS% 11/24/2016 0.9  0.0 - 7.0 % Final  . BASO% 11/24/2016 0.4  0.0 - 2.0 % Final  . Magnesium 11/24/2016 2.0  1.5 - 2.5 mg/dl Final  . Phosphorus, Ser 11/24/2016 3.4  2.5 - 4.5 mg/dL Final  . Sodium 11/24/2016 137  136 - 145 mEq/L Final  . Potassium 11/24/2016 4.5  3.5 - 5.1 mEq/L Final  . Chloride 11/24/2016 98  98 - 109 mEq/L Final  . CO2 11/24/2016 32* 22 - 29 mEq/L Final  . Glucose 11/24/2016 169* 70 - 140 mg/dl Final   Glucose reference range is for nonfasting patients. Fasting glucose reference range is 70- 100.  Marland Kitchen BUN 11/24/2016 14.0  7.0 - 26.0 mg/dL Final  . Creatinine 11/24/2016 0.7  0.7 - 1.3 mg/dL Final  . Total Bilirubin 11/24/2016 0.52  0.20 - 1.20 mg/dL Final  . Alkaline Phosphatase 11/24/2016 88  40 - 150 U/L Final  . AST 11/24/2016 29  5 - 34 U/L Final  . ALT 11/24/2016 47  0 - 55 U/L Final  . Total Protein 11/24/2016 6.7  6.4 - 8.3 g/dL Final  . Albumin 11/24/2016 2.9* 3.5 - 5.0 g/dL Final  . Calcium 11/24/2016 9.2  8.4 - 10.4 mg/dL Final  . Anion Gap 11/24/2016 6  3 - 11 mEq/L Final  . EGFR 11/24/2016 >60  >60 ml/min/1.73 m2 Final   eGFR is calculated using the CKD-EPI Creatinine Equation (2009)       Ardath Sax, MD

## 2016-12-01 ENCOUNTER — Ambulatory Visit (HOSPITAL_BASED_OUTPATIENT_CLINIC_OR_DEPARTMENT_OTHER): Payer: Medicare Other | Admitting: Hematology and Oncology

## 2016-12-01 ENCOUNTER — Encounter: Payer: Self-pay | Admitting: Hematology and Oncology

## 2016-12-01 ENCOUNTER — Other Ambulatory Visit (HOSPITAL_BASED_OUTPATIENT_CLINIC_OR_DEPARTMENT_OTHER): Payer: Medicare Other

## 2016-12-01 VITALS — BP 125/80 | HR 106 | Temp 98.0°F | Resp 17 | Ht 69.0 in | Wt 164.8 lb

## 2016-12-01 DIAGNOSIS — C770 Secondary and unspecified malignant neoplasm of lymph nodes of head, face and neck: Secondary | ICD-10-CM | POA: Diagnosis not present

## 2016-12-01 DIAGNOSIS — C01 Malignant neoplasm of base of tongue: Secondary | ICD-10-CM

## 2016-12-01 LAB — COMPREHENSIVE METABOLIC PANEL
ALBUMIN: 3.1 g/dL — AB (ref 3.5–5.0)
ALK PHOS: 99 U/L (ref 40–150)
ALT: 49 U/L (ref 0–55)
ANION GAP: 9 meq/L (ref 3–11)
AST: 29 U/L (ref 5–34)
BILIRUBIN TOTAL: 0.33 mg/dL (ref 0.20–1.20)
BUN: 13.6 mg/dL (ref 7.0–26.0)
CALCIUM: 9.5 mg/dL (ref 8.4–10.4)
CO2: 32 meq/L — AB (ref 22–29)
CREATININE: 0.6 mg/dL — AB (ref 0.7–1.3)
Chloride: 98 mEq/L (ref 98–109)
EGFR: >60 mL/min/{1.73_m2} (ref >60)
Glucose: 135 mg/dl (ref 70–140)
Potassium: 4.1 mEq/L (ref 3.5–5.1)
Sodium: 139 mEq/L (ref 136–145)
TOTAL PROTEIN: 6.8 g/dL (ref 6.4–8.3)

## 2016-12-01 LAB — CBC WITH DIFFERENTIAL/PLATELET
BASO%: 0.4 % (ref 0.0–2.0)
Basophils Absolute: 0 10*3/uL (ref 0.0–0.1)
EOS ABS: 0 10*3/uL (ref 0.0–0.5)
EOS%: 0.7 % (ref 0.0–7.0)
HEMATOCRIT: 33.2 % — AB (ref 38.4–49.9)
HEMOGLOBIN: 11.2 g/dL — AB (ref 13.0–17.1)
LYMPH#: 0.2 10*3/uL — AB (ref 0.9–3.3)
LYMPH%: 5 % — ABNORMAL LOW (ref 14.0–49.0)
MCH: 32 pg (ref 27.2–33.4)
MCHC: 33.8 g/dL (ref 32.0–36.0)
MCV: 94.7 fL (ref 79.3–98.0)
MONO#: 0.4 10*3/uL (ref 0.1–0.9)
MONO%: 10.6 % (ref 0.0–14.0)
NEUT%: 83.3 % — ABNORMAL HIGH (ref 39.0–75.0)
NEUTROS ABS: 3.2 10*3/uL (ref 1.5–6.5)
PLATELETS: 157 10*3/uL (ref 140–400)
RBC: 3.51 10*6/uL — ABNORMAL LOW (ref 4.20–5.82)
RDW: 17.5 % — ABNORMAL HIGH (ref 11.0–14.6)
WBC: 3.9 10*3/uL — AB (ref 4.0–10.3)

## 2016-12-01 LAB — MAGNESIUM: MAGNESIUM: 2.2 mg/dL (ref 1.5–2.5)

## 2016-12-01 MED ORDER — MORPHINE SULFATE (CONCENTRATE) 10 MG /0.5 ML PO SOLN: 10.0000 mg | ORAL | 0 refills | Status: DC | PRN

## 2016-12-02 ENCOUNTER — Telehealth: Payer: Self-pay | Admitting: Hematology and Oncology

## 2016-12-02 NOTE — Telephone Encounter (Signed)
Scheduled appt per 10/24 los - f/u in one month - sent reminder letter in the mail.

## 2016-12-03 ENCOUNTER — Telehealth: Payer: Self-pay | Admitting: *Deleted

## 2016-12-03 ENCOUNTER — Inpatient Hospital Stay
Admission: RE | Admit: 2016-12-03 | Discharge: 2016-12-03 | Disposition: A | Discharge status: Home / Self Care | Payer: Self-pay | Source: Ambulatory Visit | Attending: Radiation Oncology | Admitting: Radiation Oncology

## 2016-12-03 HISTORY — DX: Personal history of irradiation: Z92.3

## 2016-12-03 NOTE — Telephone Encounter (Signed)
CALLED PATIENT'S DAUGHTER (JAMESON) TO RESCHEDULE FU FOR TODAY, PATIENT'S DAUGHTER WANTS TO COME 12-10-16, APPT. RESCHEDULED FOR 12-10-16 @ 10:20 AM, PATIENT'S DAUGHTER AGREED TO THIS DAY AND TIME

## 2016-12-03 NOTE — Telephone Encounter (Signed)
Oncology Nurse Navigator Documentation  Patient dtr called to cancel this afternoon's appt d/t her work conflict, asked for reschedule.  She stated they met with Dr. Lebron Conners Wednesday of this week and SLP Garald Balding on Monday, no issues of concern.  Gayleen Orem, RN, BSN, [facility] Nurse [place] [facility] [place] [phone]

## 2016-12-07 ENCOUNTER — Telehealth (HOSPITAL_COMMUNITY): Payer: Self-pay | Admitting: Dentistry

## 2016-12-07 NOTE — Assessment & Plan Note (Signed)
71 y.o. 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 No evidence of distal metastatic disease at this time based on the initial PET/CT. Patient was not a candidate for cisplatin-based chemotherapy due to pre-existing hearing loss requiring hearing aids.   Patient was started on systemic chemotherapy with combination of weekly carboplatin and paclitaxel for 10/01/16. Patient continues to recover from the effects of chemotherapy. Skin is improving, so does the mucositis in the mouth. Patient's secretions were a significant problem, but with additional acetylcysteine, the symptoms are improving. Clinical evaluation and lab work demonstrates that the improvement. Significant electrolyte abnormalities.  Plan: --Continue Lorazepam to 1mg  PO Q6hrs PRN --Continue Acetylcysteine pwdr 600mg  PO swish/spit BID --RTC 1 month: Clinic visit for recovery monitoring --Restaging PET/CT at discretion of Dr. Isidore Moos

## 2016-12-07 NOTE — Progress Notes (Signed)
[facility] Follow-up Visit:  Assessment: Cancer of base of tongue ([place]) 71 y.o. 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 No evidence of distal metastatic disease at this time based on the initial PET/CT. Patient was not a candidate for cisplatin-based chemotherapy due to pre-existing hearing loss requiring hearing aids.   Patient was started on systemic chemotherapy with combination of weekly carboplatin and paclitaxel for 10/01/16. Patient continues to recover from the effects of chemotherapy. Skin is improving, so does the mucositis in the mouth. Patient's secretions were a significant problem, but with additional acetylcysteine, the symptoms are improving. Clinical evaluation and lab work demonstrates that the improvement. Significant electrolyte abnormalities.  Plan: --Continue Lorazepam to '1mg'$  PO Q6hrs PRN --Continue Acetylcysteine pwdr '600mg'$  PO swish/spit BID --RTC 1 month: Clinic visit for recovery monitoring --Restaging PET/CT at discretion of Dr. Isidore Moos  Voice recognition software was used and creation of this note. Despite my best effort at editing the text, some misspelling/errors may have occurred.  No orders of the defined types were placed in this encounter.   Cancer Staging Cancer of base of tongue ([place]) Staging form: Pharynx - HPV-Mediated Oropharynx, AJCC 8th Edition - Clinical: Stage II (cT2, cN2, cM0, p16: Positive) - Signed by Eppie Gibson, MD on 09/14/2016   All questions were answered.  . The patient knows to call the clinic with any problems, questions or concerns.  This note was electronically signed.    History of Presenting Illness Joel Walton is a 71 y.o. 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 Patient is currently undergoing curative-intent treatment with chemoradiotherapy. He presents to the clinic for possible 4th week of carboplatin and paclitaxel therapy administered concurrently with radiation.  Patient returns to the clinic for continued recovery monitoring after completion of chemoradiotherapy. Acetylcysteine appears to have helped with mucus, gagging, and vomiting. Patient has easier time clearing secretions. Pain is well-controlled. Starting to increase nutritional intake. Denies any new complaints.  Oncological/hematological History:   Cancer of base of tongue ([place])   08/26/2016 Pathology Results    Tongue biopsy confirmed squamous cell carcinoma      08/27/2016 Initial Diagnosis    Cancer of base of tongue ([place]): positive for p16      09/01/2016 Imaging    CT Neck: Large necrotic an ulcerating lesion at the right base of the tongue measuring 3.9 cm. Pathological lymphadenopathy in the right neck noted.      09/07/2016 Imaging    PET-CT: Large hypermetabolic mass at the right base of the tongue with extension across the midline. Significant hypermetabolic level II right-sided lymphadenopathy, possible pathological lymphadenopathy on the left side at level II.      09/24/2016 Procedure    Ultrasound and fluoroscopically guided right internal jugular single lumen power port catheter insertion. Tip in the SVC/RA junction. Catheter ready for use      09/24/2016 Procedure    Fluoroscopic insertion of a 20-French "pull-through" gastrostomy.      09/28/2016 -  Radiation Therapy           10/01/2016 -  Chemotherapy    Carboplatin AUC2 QWk + Paclitaxel '45mg'$ /m2 QWk concurrent with radiation therapy --Wk #1, 10/01/16: --Wk #2, 10/08/16: --Wk #3, 10/14/16: --Wk #  4, 10/22/16: --Wk #5, 10/29/16: --Wk #6, 11/05/16: skipped due to neutropenia --Wk #7, 11/12/16:       Medical History: Past Medical History:   Diagnosis Date  . History of radiation therapy 09/28/16- 11/19/16   head and neck, base of tongue 70 Gy total in 35 fractions  . Hyperlipidemia   . Hypertension     Surgical History: Past Surgical History:  Procedure Laterality Date  . ELBOW SURGERY Right   . IR FLUORO GUIDE PORT INSERTION RIGHT  09/24/2016  . IR GASTROSTOMY TUBE MOD SED  09/24/2016  . IR US GUIDE VASC ACCESS RIGHT  09/24/2016  . KNEE SURGERY Bilateral    Not total knee replacements  . SHOULDER ARTHROSCOPY Right     Family History: History reviewed. No pertinent family history.  Social History: Social History   Social History  . Marital status: Married    Spouse name: N/A  . Number of children: 2  . Years of education: N/A   Occupational History  . Retired      Personal assistant   Social History Main Topics  . Smoking status: Former Smoker    Packs/day: 0.50  . Smokeless tobacco: Never Used     Comment: Quit in 1978  . Alcohol use No     Comment: 3-4 beers daily  . Drug use: No  . Sexual activity: Not Currently   Other Topics Concern  . Not on file   Social History Narrative  . No narrative on file    Allergies: No Known Allergies  Medications:  Current Outpatient Prescriptions  Medication Sig Dispense Refill  . Acetylcysteine (N-ACETYL-L-CYSTEINE) POWD Swish and spit 600 mg 2 (two) times daily as needed. 2 Bottle 0  . dexamethasone (DECADRON) 0.5 MG/5ML solution Take '8mg'$  (73m) per tube with food daily through Sept 28, then '4mg'$  (461m daily through Oct 2, then '2mg'$  (2042mdaily through Oct 6. 500 mL 0  . diphenhydrAMINE (BENYLIN) 12.5 MG/5ML syrup Take 2.5 mLs (6.25 mg total) by mouth at bedtime as needed for allergies. 120 mL 0  . lidocaine (XYLOCAINE) 2 % solution Patient: Mix 1part 2% viscous lidocaine, 1part H20. Swish & swallow 62m50m diluted mixture, 30mi85mfore meals and at bedtime, up to QID 100 mL 5  . lidocaine-prilocaine (EMLA) cream Apply to affected area once 30 g 3  .  LORazepam (LORAZEPAM INTENSOL) 2 MG/ML concentrated solution Take 0.5 mLs (1 mg total) by mouth every 4 (four) hours as needed for anxiety (nausea). 30 mL 0  . Morphine Sulfate (MORPHINE CONCENTRATE) 10 mg / 0.5 ml concentrated solution Take 0.5 mLs (10 mg total) by mouth every 4 (four) hours as needed for severe pain. 1 Bottle 0  . Multiple Vitamin (MULTIVITAMIN) tablet Take 1 tablet by mouth daily.    . Nutritional Supplements (FEEDING SUPPLEMENT, OSMOLITE 1.5 CAL,) LIQD Give 1.5 bottles Osmolite 1.5 QID with 60 mL free water before and after via PEG. Increase to 2 bottles once daily and continue 1.5 bottles TID as tolerated. Drink or flush PEG with additional 240 mL free water TID. Send supplies and formula. Call daughter, JamieRoselyn [phone] delivery for patient. 1541 mL 0  . omeprazole (PRILOSEC) 20 MG capsule   0  . ondansetron (ZOFRAN) 8 MG tablet Take 1 tablet (8 mg total) by mouth 2 (two) times daily as needed for refractory nausea / vomiting. Start on day 3 after chemo. 30 tablet 1  . predniSONE (DELTASONE) 20 MG tablet 3 tabs  poqday 1-2, 2 tabs poqday 3-4, 1 tab poqday 5-6 12 tablet 0  . prochlorperazine (COMPAZINE) 10 MG tablet Take 1 tablet (10 mg total) by mouth every 6 (six) hours as needed (Nausea or vomiting). 30 tablet 1   No current facility-administered medications for this visit.     Review of Systems: Review of Systems  HENT:   Positive for sore throat and trouble swallowing. Negative for mouth sores.   Gastrointestinal: Negative for nausea and vomiting.  All other systems reviewed and are negative.    PHYSICAL EXAMINATION Blood pressure 125/80, pulse (!) 106, temperature 98 F (36.7 C), temperature source Oral, resp. rate 17, height 5\' 9"  (1.753 m), weight 164 lb 12.8 oz (74.8 kg), SpO2 100 %.  ECOG PERFORMANCE STATUS: 1 - Symptomatic but completely ambulatory  Physical Exam  Constitutional: He is oriented to person, place, and time and well-developed,  well-nourished, and in no distress. No distress.  HENT:  Mouth/Throat: Oropharynx is clear and moist and mucous membranes are normal. No dental abscesses.  Edentulous, wearing dentures. No visible lesion noted no anterior oral ulcerations, leukoplakia. Visible oropharynx without abnormality.  Eyes: Pupils are equal, round, and reactive to light. Conjunctivae and EOM are normal. No scleral icterus.  Neck:  Significant reduction in the bulk of the cervical lymph nodes as well as softening of the texture compared to initial presentation.  Cardiovascular: Normal rate and regular rhythm.   No murmur heard. Pulmonary/Chest: Effort normal and breath sounds normal. No respiratory distress. He has no wheezes. He has no rales.  Abdominal: Soft. Bowel sounds are normal. He exhibits no distension and no mass. There is no tenderness.  Musculoskeletal: Normal range of motion. He exhibits no edema.  Neurological: He is alert and oriented to person, place, and time. He has normal reflexes. No cranial nerve deficit. Coordination normal.  Skin: Skin is warm and dry. He is not diaphoretic. There is erythema.  Progressive radiation erythema lateral neck with mild desquamation, but no blistering.     LABORATORY DATA: I have personally reviewed the data as listed: Appointment on 12/01/2016  Component Date Value Ref Range Status  . WBC 12/01/2016 3.9* 4.0 - 10.3 10e3/uL Final  . NEUT# 12/01/2016 3.2  1.5 - 6.5 10e3/uL Final  . HGB 12/01/2016 11.2* 13.0 - 17.1 g/dL Final  . HCT 12/03/2016 33.2* 38.4 - 49.9 % Final  . Platelets 12/01/2016 157  140 - 400 10e3/uL Final  . MCV 12/01/2016 94.7  79.3 - 98.0 fL Final  . MCH 12/01/2016 32.0  27.2 - 33.4 pg Final  . MCHC 12/01/2016 33.8  32.0 - 36.0 g/dL Final  . RBC 12/03/2016 3.51* 4.20 - 5.82 10e6/uL Final  . RDW 12/01/2016 17.5* 11.0 - 14.6 % Final  . lymph# 12/01/2016 0.2* 0.9 - 3.3 10e3/uL Final  . MONO# 12/01/2016 0.4  0.1 - 0.9 10e3/uL Final  . Eosinophils  Absolute 12/01/2016 0.0  0.0 - 0.5 10e3/uL Final  . Basophils Absolute 12/01/2016 0.0  0.0 - 0.1 10e3/uL Final  . NEUT% 12/01/2016 83.3* 39.0 - 75.0 % Final  . LYMPH% 12/01/2016 5.0* 14.0 - 49.0 % Final  . MONO% 12/01/2016 10.6  0.0 - 14.0 % Final  . EOS% 12/01/2016 0.7  0.0 - 7.0 % Final  . BASO% 12/01/2016 0.4  0.0 - 2.0 % Final  . Sodium 12/01/2016 139  136 - 145 mEq/L Final  . Potassium 12/01/2016 4.1  3.5 - 5.1 mEq/L Final  . Chloride 12/01/2016 98  98 -  109 mEq/L Final  . CO2 12/01/2016 32* 22 - 29 mEq/L Final  . Glucose 12/01/2016 135  70 - 140 mg/dl Final   Glucose reference range is for nonfasting patients. Fasting glucose reference range is 70- 100.  Marland Kitchen BUN 12/01/2016 13.6  7.0 - 26.0 mg/dL Final  . Creatinine 12/01/2016 0.6* 0.7 - 1.3 mg/dL Final  . Total Bilirubin 12/01/2016 0.33  0.20 - 1.20 mg/dL Final  . Alkaline Phosphatase 12/01/2016 99  40 - 150 U/L Final  . AST 12/01/2016 29  5 - 34 U/L Final  . ALT 12/01/2016 49  0 - 55 U/L Final  . Total Protein 12/01/2016 6.8  6.4 - 8.3 g/dL Final  . Albumin 12/01/2016 3.1* 3.5 - 5.0 g/dL Final  . Calcium 12/01/2016 9.5  8.4 - 10.4 mg/dL Final  . Anion Gap 12/01/2016 9  3 - 11 mEq/L Final  . EGFR 12/01/2016 >60  >60 ml/min/1.73 m2 Final   eGFR is calculated using the CKD-EPI Creatinine Equation (2009)  . Magnesium 12/01/2016 2.2  1.5 - 2.5 mg/dl Final       Ardath Sax, MD

## 2016-12-07 NOTE — Telephone Encounter (Signed)
12/07/16  Called and left msg. on pt's mobile and home # to call Dental Medicine to schl. F/U appt. w/Dr. Enrique Sack.  LRI

## 2016-12-08 NOTE — Progress Notes (Signed)
Joel Walton presents for follow up of radiation completed 11/19/16 to his head and neck/ base of tongue.    Pain issues, if any: He reports pain when swallowing. He is taking morphine every 2-3 hours.  Using a feeding tube?: Yes, He is instilling 6 cans of osmolite daily. He continues to instill free water daily.  Weight changes, if any: He was not able to instill osmolite last week due to nausea because of thick saliva. He has improved his osmolite intake this week.  Wt Readings from Last 3 Encounters:  12/10/16 161 lb 6.4 oz (73.2 kg)  12/01/16 164 lb 12.8 oz (74.8 kg)  11/24/16 163 lb 4.8 oz (74.1 kg)   Swallowing issues, if any: He has pain when swallowing. He has tried ice cream but continues to have pain requiring pain medicine Smoking or chewing tobacco? No Using fluoride trays daily? No Last ENT visit was on: Not since diagnosis Other notable issues, if any: He reports continued taste changes. 12/07/16 Dr. Lebron Conners with labs. His next apt is 12/31/16. Joel [facility] ST next 12/13/16  BP 129/88   Pulse (!) 116   Temp 98.7 F (37.1 C)   Wt 161 lb 6.4 oz (73.2 kg)   SpO2 97% Comment: room air  BMI 23.83 kg/m

## 2016-12-09 ENCOUNTER — Encounter: Payer: Self-pay | Admitting: Internal Medicine

## 2016-12-10 ENCOUNTER — Other Ambulatory Visit: Payer: Self-pay | Admitting: Hematology and Oncology

## 2016-12-10 ENCOUNTER — Ambulatory Visit
Admission: RE | Admit: 2016-12-10 | Discharge: 2016-12-10 | Disposition: A | Discharge status: Home / Self Care | Payer: Medicare Other | Source: Ambulatory Visit | Attending: Radiation Oncology | Admitting: Radiation Oncology

## 2016-12-10 ENCOUNTER — Encounter: Payer: Self-pay | Admitting: Radiation Oncology

## 2016-12-10 VITALS — BP 129/88 | HR 116 | Temp 98.7°F | Wt 161.4 lb

## 2016-12-10 DIAGNOSIS — Z1329 Encounter for screening for other suspected endocrine disorder: Secondary | ICD-10-CM

## 2016-12-10 DIAGNOSIS — C01 Malignant neoplasm of base of tongue: Secondary | ICD-10-CM

## 2016-12-10 DIAGNOSIS — Z510 Encounter for antineoplastic radiation therapy: Secondary | ICD-10-CM | POA: Diagnosis not present

## 2016-12-10 MED ORDER — MORPHINE SULFATE (CONCENTRATE) 10 MG /0.5 ML PO SOLN: 10.0000 mg | ORAL | 0 refills | Status: DC | PRN

## 2016-12-10 MED FILL — MORPHINE SULF 100 MG/5 ML S: 100 | 20 days supply | Qty: 120 | Fill #0

## 2016-12-10 NOTE — Progress Notes (Signed)
Radiation Oncology         [phone] ________________________________  Name: Joel Walton MRN: [id number]  Date: 12/10/2016  DOB: 05-21-1945  Follow-Up Visit Note  CC: Lorene Dy, MD  Rozetta Nunnery, *  Diagnosis and Prior Radiotherapy:       ICD-10-CM   1. Cancer of base of tongue ([place]) C01     Cancer Staging Cancer of base of tongue ([place]) Staging form: Pharynx - HPV-Mediated Oropharynx, AJCC 8th Edition - Clinical: Stage II (cT2, cN2, cM0, p16: Positive) - Signed by Eppie Gibson, MD on 09/14/2016 Radiation treatment dates:   09/28/2016 - 11/19/2016 Site/dose:   [facility] BOT // 70 Gy in 35 fractions  CHIEF COMPLAINT:  Here for follow-up and surveillance of head and neck cancer  Narrative:  The patient returns today for routine follow-up of radiation completed 11/19/2016 to his head and neck/base of tongue.    Pain issues, if any: He reports pain with swallowing. He is taking morphine every 2-3 hours for this.   Using a feeding tube?: Yes, he is instilling 6 cans of osmolite daily. He continues to instill free water daily.   Weight changes, if any: He was not able to instill osmolite last week because of thick saliva due to nausea. His osmolite intake has improved this week. Wt Readings from Last 3 Encounters:  12/10/16 161 lb 6.4 oz (73.2 kg)  12/01/16 164 lb 12.8 oz (74.8 kg)  11/24/16 163 lb 4.8 oz (74.1 kg)   Swallowing issues, if any: He reports pain with swallowing. He is doing his swallowing exercises. He has tried ice cream but continues to have pain requiring morphine.  Smoking or chewing tobacco? No.  Last ENT visit was on: Not since diagnosis  Other notable issues, if any: He reports continued taste changes. 12/07/2016 Dr. Lebron Conners with labs. His next appointment is 12/31/2016. Garald Balding ST 12/13/2016.                ALLERGIES:  has No Known Allergies.  Meds: Current Outpatient Prescriptions  Medication Sig Dispense Refill  . Acetylcysteine  (N-ACETYL-L-CYSTEINE) POWD Swish and spit 600 mg 2 (two) times daily as needed. 2 Bottle 0  . diphenhydrAMINE (BENYLIN) 12.5 MG/5ML syrup Take 2.5 mLs (6.25 mg total) by mouth at bedtime as needed for allergies. 120 mL 0  . lidocaine-prilocaine (EMLA) cream Apply to affected area once 30 g 3  . LORazepam (LORAZEPAM INTENSOL) 2 MG/ML concentrated solution Take 0.5 mLs (1 mg total) by mouth every 4 (four) hours as needed for anxiety (nausea). 30 mL 0  . Nutritional Supplements (FEEDING SUPPLEMENT, OSMOLITE 1.5 CAL,) LIQD Give 1.5 bottles Osmolite 1.5 QID with 60 mL free water before and after via PEG. Increase to 2 bottles once daily and continue 1.5 bottles TID as tolerated. Drink or flush PEG with additional 240 mL free water TID. Send supplies and formula. Call daughter, [name], at [phone] to arrange delivery for patient. 1541 mL 0  . omeprazole (PRILOSEC) 20 MG capsule   0  . lidocaine (XYLOCAINE) 2 % solution Patient: Mix 1part 2% viscous lidocaine, 1part H20. Swish & swallow 28mL of diluted mixture, 60min before meals and at bedtime, up to QID (Patient not taking: Reported on 12/10/2016) 100 mL 5  . Morphine Sulfate (MORPHINE CONCENTRATE) 10 mg / 0.5 ml concentrated solution Take 0.5 mLs (10 mg total) by mouth every 4 (four) hours as needed for severe pain. 1 Bottle 0  . Multiple Vitamin (MULTIVITAMIN) tablet Take 1  tablet by mouth daily.    . ondansetron (ZOFRAN) 8 MG tablet Take 1 tablet (8 mg total) by mouth 2 (two) times daily as needed for refractory nausea / vomiting. Start on day 3 after chemo. (Patient not taking: Reported on 12/10/2016) 30 tablet 1  . predniSONE (DELTASONE) 20 MG tablet 3 tabs poqday 1-2, 2 tabs poqday 3-4, 1 tab poqday 5-6 (Patient not taking: Reported on 12/10/2016) 12 tablet 0  . prochlorperazine (COMPAZINE) 10 MG tablet Take 1 tablet (10 mg total) by mouth every 6 (six) hours as needed (Nausea or vomiting). (Patient not taking: Reported on 12/10/2016) 30 tablet 1   No  current facility-administered medications for this encounter.    Review of Systems: A 10+ POINT REVIEW OF SYSTEMS WAS OBTAINED including neurology, dermatology, psychiatry, cardiac, respiratory, lymph, extremities, GI, GU, Musculoskeletal, constitutional, breasts, reproductive, HEENT.  All pertinent positives are noted in the HPI.  All others are negative.  Physical Findings: Wt Readings from Last 3 Encounters:  12/10/16 161 lb 6.4 oz (73.2 kg)  12/01/16 164 lb 12.8 oz (74.8 kg)  11/24/16 163 lb 4.8 oz (74.1 kg)    weight is 161 lb 6.4 oz (73.2 kg). His temperature is 98.7 F (37.1 C). His blood pressure is 129/88 and his pulse is 116 (abnormal). His oxygen saturation is 97%. .  General: Alert and oriented, in no acute distress. HEENT: Head is normocephalic. Upper dentures removed for exam. Oropharynx is notable for confluent mucositis. Mucous membranes are moist. Neck: Neck is notable for no palpable masses in the cervical or supraclavicular regions.  Skin: Skin in treatment fields shows satisfactory healing. His skin is still dry over his neck. Lymphatics: see Neck Exam Psychiatric: Judgment and insight are intact. Affect is appropriate.   Lab Findings: Lab Results  Component Value Date   WBC 3.9 (L) 12/01/2016   HGB 11.2 (L) 12/01/2016   HCT 33.2 (L) 12/01/2016   MCV 94.7 12/01/2016   PLT 157 12/01/2016    Lab Results  Component Value Date   TSH 0.151 (L) 10/06/2016    Radiographic Findings: No results found.  Impression/Plan:    1) Head and Neck Cancer Status: healing well from ChRT  2) Nutritional Status: He is regaining his sense of smell and eating soft foods. PEG Tube: Instilling 6 cans of osmolite daily and free water daily.  3) Risk Factors: The patient has been educated about risk factors including alcohol and tobacco abuse; they understand that avoidance of alcohol and tobacco is important to prevent recurrences as well as other cancers  4) Swallowing: He  was advised to continue eating softer foods.  5) Thyroid function: recheck in 3 mo Lab Results  Component Value Date   TSH 0.151 (L) 10/06/2016    7) Other: Advised to apply Vitamin E cream to the skin twice per day over the next few months. PET scan in 3 months. Continue routine follow-up with Dr. Lebron Conners.  8) Follow-up in 3 months to discuss PET scan results. The patient was encouraged to call with any issues or questions before then.  I spent 10 minutes minutes face to face with the patient and more than 50% of that time was spent in counseling and/or coordination of care. _____________________________________   Eppie Gibson, MD  This document serves as a record of services personally performed by Eppie Gibson, MD. It was created on her behalf by Rae Lips, a trained medical scribe. The creation of this record is based on the scribe's personal  observations and the provider's statements to them. This document has been checked and approved by the attending provider.

## 2016-12-13 ENCOUNTER — Ambulatory Visit: Payer: Medicare Other

## 2016-12-20 ENCOUNTER — Telehealth (HOSPITAL_COMMUNITY): Payer: Self-pay | Admitting: Dentistry

## 2016-12-20 NOTE — Telephone Encounter (Signed)
12/20/16 Called and left msg. for pt's daughter , Donzetta Sprung to call Dental Medicine to sch.  F/U appt.for pt. w/Dr. Enrique Sack.  LRI

## 2016-12-21 ENCOUNTER — Ambulatory Visit: Payer: Medicare Other | Attending: Radiation Oncology

## 2016-12-21 DIAGNOSIS — R1310 Dysphagia, unspecified: Secondary | ICD-10-CM | POA: Diagnosis present

## 2016-12-21 NOTE — Patient Instructions (Signed)
Use a food journal to help you figure out what things you might like to eat.  Please keep a chart tracking when you do the exercises so I can see what you've done.

## 2016-12-21 NOTE — Therapy (Signed)
[facility] [address] [place], [place], [address] Phone: [phone]   Fax:  [phone]  Speech Language Pathology Treatment  Patient Details  Name: Joel Walton MRN: [id number] Date of Birth: January 16, 1946 Referring Provider: Eppie Gibson, MD   Encounter Date: 12/21/2016  End of Session - 12/21/16 1631    Visit Number  3    Number of Visits  5    Date for SLP Re-Evaluation  02/19/17    Authorization Type  [employer] M[employer]     Authorization Time Period  10-05-16 to 04-06-17    Authorization - Visit Number  3    Authorization - Number of Visits  6    SLP Start Time  9767    SLP Stop Time   3419    SLP Time Calculation (min)  36 min    Activity Tolerance  Patient tolerated treatment well       Past Medical History:  Diagnosis Date  . History of radiation therapy 09/28/16- 11/19/16   head and neck, base of tongue 70 Gy total in 35 fractions  . Hyperlipidemia   . Hypertension     Past Surgical History:  Procedure Laterality Date  . ELBOW SURGERY Right   . IR FLUORO GUIDE PORT INSERTION RIGHT  09/24/2016  . IR GASTROSTOMY TUBE MOD SED  09/24/2016  . IR US GUIDE VASC ACCESS RIGHT  09/24/2016  . KNEE SURGERY Bilateral    Not total knee replacements  . SHOULDER ARTHROSCOPY Right     There were no vitals filed for this visit.  Subjective Assessment - 12/21/16 1452    Subjective  "He had a hard time with the ones that you have to get high."    Patient is accompained by:  Family member daughter    Currently in Pain?  No/denies            ADULT SLP TREATMENT - 12/21/16 1453      General Information   Behavior/Cognition  Alert;Cooperative;Pleasant mood      Treatment Provided   Treatment provided  Dysphagia      Dysphagia Treatment   Temperature Spikes Noted  No    Oral Cavity - Dentition  Dentures, bottom;Dentures, top    Treatment Methods  Skilled observation;Patient/caregiver education    Patient  observed directly with PO's  Yes    Type of PO's observed  Thin liquids    Pharyngeal Phase Signs & Symptoms  Wet vocal quality;Complaints of globus    Other treatment/comments  Pt reports gagging leading to vomiting. Pt reports he is drinking a little water and eating a little ice cream. Pt with cont'd taste aversions. HEP completed today with rare min A, SLP suggested pt fill out a chart with each exercise and keep track of when he has completed each exercise and bring this to SLP next visit so tracking HEP can be completed. SLP also suggested food journal as pt begins return to more WNL consistencies.      Assessment / Recommendations / Plan   Plan  Continue with current plan of care      Progression Toward Goals   Progression toward goals  Progressing toward goals       SLP Education - 12/21/16 1630    Education provided  Yes    Education Details  food journal, HEP procedure and chart    Person(s) Educated  Patient;Child(ren)    Methods  Explanation    Comprehension  Verbalized understanding  SLP Short Term Goals - 12/21/16 1633      SLP SHORT TERM GOAL #1   Title  pt will complete HEP with rare min A over two sessions    Baseline  12-21-16    Time  1    Period  -- visits (visit number 4)    Status  Revised      SLP SHORT TERM GOAL #2   Title  pt will tell SLP why he is completing HEP over two sessions    Time  1    Period  -- visit    Status  Revised       SLP Long Term Goals - 12/21/16 1635      SLP LONG TERM GOAL #1   Title  pt will tell SLP 3 overt s/s aspiration PNA with modified independence    Time  2    Period  -- visits    Status  On-going      SLP LONG TERM GOAL #2   Title  pt will complete HEP with modified independence over two sessions     Time  2    Period  -- vistis    Status  Revised      SLP LONG TERM GOAL #3   Title  pt will tell SLP why a food journal is helpful in returning to most liberal diet    Status  Achieved       Plan -  12/21/16 1632    Clinical Impression Statement  Pt with oropharyngeal swallowing improving to more WFL status. Pt has been better with HEP frequency and regimen of exercises since last visit. SEe "skilled intervention" for more details. SLP revised pt's STGs and LTGs for enforced to 5 total visits. The probability of further swallowing difficulty increases dramatically with the initiation of chemo and radiation therapy. Pt will need to be followed by SLP for regular assessment of accurate HEP completion as well as for safety with POs both during and following treatment/s.    Speech Therapy Frequency  -- once approx every four weeks    Duration  -- two visits/6-7 total visits    Treatment/Interventions  Aspiration precaution training;Pharyngeal strengthening exercises;Diet toleration management by SLP;Compensatory techniques;Internal/external aids;SLP instruction and feedback;Patient/family education;Oral motor exercises;Trials of upgraded texture/liquids;Environmental controls any or any combination may be used    Potential to Achieve Goals  Good    SLP Home Exercise Plan  provided today    Consulted and Agree with Plan of Care  Patient       Patient will benefit from skilled therapeutic intervention in order to improve the following deficits and impairments:   Dysphagia, unspecified type - Plan: SLP plan of care cert/re-cert    Problem List Patient Active Problem List   Diagnosis Date Noted  . Elevated liver enzymes 10/07/2016  . 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 Other constipation 09/29/2016  . Mild protein malnutrition ([place]) 09/29/2016  . Dysphagia 09/29/2016  . 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 Cancer of base of tongue ([place]) 09/14/2016    [facility] ,[place], [place]  12/21/2016, 4:38 PM  [facility] [address] [place], [place], [address] Phone: [phone]   Fax:  [phone]   Name: Joel Walton MRN: [id number] Date of Birth: 1945-06-12

## 2016-12-27 ENCOUNTER — Telehealth: Payer: Self-pay

## 2016-12-27 NOTE — Telephone Encounter (Signed)
Received fax from [facility]. Per fax, pt had complained of nausea. Encouraged to take medication as prescribed by Dr. Lebron Conners for nausea/vomiting. Additionally, pt to f/u within two weeks. Confirmed appt with pt on 11/23.

## 2016-12-31 ENCOUNTER — Ambulatory Visit (HOSPITAL_BASED_OUTPATIENT_CLINIC_OR_DEPARTMENT_OTHER): Payer: Medicare Other | Admitting: Hematology and Oncology

## 2016-12-31 ENCOUNTER — Encounter: Payer: Self-pay | Admitting: Hematology and Oncology

## 2016-12-31 DIAGNOSIS — C01 Malignant neoplasm of base of tongue: Secondary | ICD-10-CM | POA: Diagnosis not present

## 2016-12-31 DIAGNOSIS — C770 Secondary and unspecified malignant neoplasm of lymph nodes of head, face and neck: Secondary | ICD-10-CM | POA: Diagnosis not present

## 2016-12-31 MED ORDER — ONDANSETRON HCL 8 MG PO TABS: 8.0000 mg | ORAL_TABLET | Freq: Two times a day (BID) | ORAL | 1 refills | Status: DC | PRN

## 2016-12-31 MED ORDER — MORPHINE SULFATE (CONCENTRATE) 10 MG /0.5 ML PO SOLN: 10.0000 mg | ORAL | 0 refills | Status: DC | PRN

## 2016-12-31 MED ORDER — N-ACETYL-L-CYSTEINE POWD: 600.0000 mg | Freq: Two times a day (BID) | 0 refills | Status: DC | PRN

## 2016-12-31 MED ORDER — LORAZEPAM 2 MG/ML PO CONC: 1.0000 mg | ORAL | 0 refills | Status: DC | PRN

## 2017-01-03 ENCOUNTER — Ambulatory Visit: Payer: Medicare Other

## 2017-01-04 ENCOUNTER — Encounter (HOSPITAL_COMMUNITY): Payer: Self-pay | Admitting: Dentistry

## 2017-01-04 ENCOUNTER — Ambulatory Visit (HOSPITAL_COMMUNITY): Payer: Self-pay | Admitting: Dentistry

## 2017-01-04 VITALS — BP 133/83 | HR 99 | Temp 98.6°F | Wt 161.0 lb

## 2017-01-04 DIAGNOSIS — K117 Disturbances of salivary secretion: Secondary | ICD-10-CM

## 2017-01-04 DIAGNOSIS — K1233 Oral mucositis (ulcerative) due to radiation: Secondary | ICD-10-CM

## 2017-01-04 DIAGNOSIS — R682 Dry mouth, unspecified: Secondary | ICD-10-CM

## 2017-01-04 DIAGNOSIS — K08109 Complete loss of teeth, unspecified cause, unspecified class: Secondary | ICD-10-CM

## 2017-01-04 DIAGNOSIS — Z923 Personal history of irradiation: Secondary | ICD-10-CM

## 2017-01-04 DIAGNOSIS — Z9221 Personal history of antineoplastic chemotherapy: Secondary | ICD-10-CM

## 2017-01-04 DIAGNOSIS — Z5189 Encounter for other specified aftercare: Secondary | ICD-10-CM | POA: Diagnosis not present

## 2017-01-04 DIAGNOSIS — C01 Malignant neoplasm of base of tongue: Secondary | ICD-10-CM

## 2017-01-04 DIAGNOSIS — K0820 Unspecified atrophy of edentulous alveolar ridge: Secondary | ICD-10-CM

## 2017-01-04 DIAGNOSIS — R432 Parageusia: Secondary | ICD-10-CM

## 2017-01-04 NOTE — Progress Notes (Signed)
01/04/2017  Patient Name:   Joel Walton Date of Birth:   Jan 25, 1946 Medical Record Number: [id number]  BP 133/83 (BP Location: Left Arm)   Pulse 99   Temp 98.6 F (37 C) (Oral)   Wt 161 lb (73 kg)   BMI 23.78 kg/m   Joel Walton presents for oral examination after chemoradiation therapy. Patient has completed radiation treatments from 09/28/2016 through 11/19/2016. Patient completed 5 weekly chemotherapy treatments.  REVIEW OF CHIEF COMPLAINTS:  DRY MOUTH: Yes HARD TO SWALLOW: Yes, at times  HURT TO SWALLOW: Yes, times TASTE CHANGES: Taste is slowly returning SORES IN MOUTH: None noted TRISMUS: No problems with trismus WEIGHT: 161 pounds.  HOME OH REGIMEN:  BRUSHING: Edentulous.  Patient reminded to brush tongue daily. FLOSSING: Not applicable RINSING: Using rinse prescribed by Dr. Lebron Conners for thick saliva. FLUORIDE: Not applicable TRISMUS EXERCISES:  Maximum interincisal opening: 40 mm  DENTAL EXAM:  Oral Hygiene:(PLAQUE): Edentulous.  Patient reminded to brush his tongue daily. LOCATION OF MUCOSITIS: Minimal erythema involving back of throat and bilateral buccal mucosa.   DESCRIPTION OF SALIVA: Decreased saliva.  Mild xerostomia with thick saliva. ANY EXPOSED BONE: None noted OTHER WATCHED AREAS: None DX: Xerostomia, Dysgeusia, Dysphagia, Odynophagia and Mucositis  RECOMMENDATIONS: 1. Brush tongue daily.   2. Use trismus exercises as directed. 3. Continue to use rinse prescribed by Dr. Lebron Conners.  Consider use of salt water rinses in the future if no discomfort.   4. Multiple sips of water as needed. 5.  Patient to consider fabrication of new dentures in 3 months if so desired.  Lenn Cal, DDS

## 2017-01-04 NOTE — Patient Instructions (Addendum)
RECOMMENDATIONS: 1. Brush tongue daily.   2. Use trismus exercises as directed. 3. Continue to use rinse prescribed by Dr. Lebron Conners.  Consider use of salt water rinses in the future if no discomfort.   4. Multiple sips of water as needed. 5.  Patient to consider fabrication of new dentures in 3 months if so desired.  Lenn Cal, DDS

## 2017-01-06 ENCOUNTER — Other Ambulatory Visit: Payer: Self-pay

## 2017-01-06 ENCOUNTER — Telehealth: Payer: Self-pay

## 2017-01-06 DIAGNOSIS — C01 Malignant neoplasm of base of tongue: Secondary | ICD-10-CM

## 2017-01-06 MED ORDER — LORAZEPAM 2 MG/ML PO CONC: 1.0000 mg | ORAL | 0 refills | Status: DC | PRN

## 2017-01-06 NOTE — Telephone Encounter (Signed)
Patient's wife called stating patient went to the dentist on Tuesday and has been having ear pain and a headache since his visit to the dentist. Patient's wife states patient is "worried that his cancer has returned." Dr. Lebron Conners made aware. Patient wife informed that patient can come in to see Dr. Lebron Conners or go see ENT doctor. Patient's wife thinks pain is related to dentist visit but will discuss with her daughter who transports patient and give [place] a return call if patient wants to be seen.

## 2017-01-12 ENCOUNTER — Other Ambulatory Visit: Payer: Self-pay | Admitting: Hematology and Oncology

## 2017-01-12 ENCOUNTER — Telehealth: Payer: Self-pay | Admitting: Hematology and Oncology

## 2017-01-12 DIAGNOSIS — C01 Malignant neoplasm of base of tongue: Secondary | ICD-10-CM

## 2017-01-12 MED ORDER — MORPHINE SULFATE (CONCENTRATE) 10 MG /0.5 ML PO SOLN: 20.0000 mg | ORAL | 0 refills | Status: DC | PRN

## 2017-01-12 NOTE — Telephone Encounter (Signed)
Spoke with patient re appt. Schedule mailed °

## 2017-01-16 NOTE — Assessment & Plan Note (Signed)
71 y.o. 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 No evidence of distal metastatic disease at this time based on the initial PET/CT. Patient was not a candidate for cisplatin-based chemotherapy due to pre-existing hearing loss requiring hearing aids.   Patient was started on systemic chemotherapy with combination of weekly carboplatin and paclitaxel for 10/01/16 and now has completed the therapy. Patient appears to be recovering, but has developed interval lymphedema and bilateral neck. Continues to have coughing fits to the point of vomiting.  Plan: --Continue Lorazepam to 1mg  PO Q6hrs PRN --Continue Acetylcysteine pwdr 600mg  PO swish/spit BID --will refer back to speech therapy for additional lymphedema exercises --RTC 6 to 8 weeks: Clinic visit for recovery monitoring --Restaging PET/CT at discretion of Dr. Isidore Moos

## 2017-01-16 NOTE — Progress Notes (Signed)
Joel Walton Visit:  Assessment: No problem-specific Assessment & Plan notes found for this encounter.  Voice recognition software was used and creation of this note. Despite my best effort at editing the text, some misspelling/errors may have occurred.  No orders of the defined types were placed in this encounter.   Cancer Staging Cancer of base of tongue ([place]) Staging form: Pharynx - HPV-Mediated Oropharynx, AJCC 8th Edition - Clinical: Stage II (cT2, cN2, cM0, p16: Positive) - Signed by Eppie Gibson, MD on 09/14/2016   All questions were answered.  . The patient knows to call the clinic with any problems, questions or concerns.  This note was electronically signed.    History of Presenting Illness Anthonymichael Munday Walton is a 71 y.o. 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 Patient is currently undergoing curative-intent treatment with chemoradiotherapy. He presents to the clinic for possible 4th week of carboplatin and paclitaxel therapy administered concurrently with radiation.  Patient returns to the clinic for continued recovery monitoring after completion of chemoradiotherapy. Acetylcysteine appears to have helped with mucus, gagging, and vomiting. Patient still indicates feeling off close throat every morning which produces coughing fits that occasionally result in vomiting.  Oncological/hematological History:   Cancer of base of tongue ([place])   08/26/2016 Pathology Results    Tongue biopsy confirmed squamous cell carcinoma      08/27/2016 Initial Diagnosis    Cancer of base of tongue ([place]): positive for p16      09/01/2016 Imaging    CT Neck: Large necrotic an ulcerating lesion at the right base of the tongue measuring 3.9 cm. Pathological lymphadenopathy in the right neck noted.      09/07/2016 Imaging    PET-CT: Large hypermetabolic mass at the right base of  the tongue with extension across the midline. Significant hypermetabolic level II right-sided lymphadenopathy, possible pathological lymphadenopathy on the left side at level II.      09/24/2016 Procedure    Ultrasound and fluoroscopically guided right internal jugular single lumen power port catheter insertion. Tip in the SVC/RA junction. Catheter ready for use      09/24/2016 Procedure    Fluoroscopic insertion of a 20-French "pull-through" gastrostomy.      09/28/2016 -  Radiation Therapy           10/01/2016 -  Chemotherapy    Carboplatin AUC2 QWk + Paclitaxel '45mg'$ /m2 QWk concurrent with radiation therapy --Wk #1, 10/01/16: --Wk #2, 10/08/16: --Wk #3, 10/14/16: --Wk #4, 10/22/16: --Wk #5, 10/29/16: --Wk #6, 11/05/16: skipped due to neutropenia --Wk #7, 11/12/16:       Medical History: Past Medical History:  Diagnosis Date  . History of radiation therapy 09/28/16- 11/19/16   head and neck, base of tongue 70 Gy total in 35 fractions  . Hyperlipidemia   . Hypertension     Surgical History: Past Surgical History:  Procedure Laterality Date  . ELBOW SURGERY Right   . IR FLUORO GUIDE PORT INSERTION RIGHT  09/24/2016  . IR GASTROSTOMY TUBE MOD SED  09/24/2016  . IR US GUIDE VASC ACCESS RIGHT  09/24/2016  . KNEE SURGERY Bilateral    Not total knee replacements  . SHOULDER ARTHROSCOPY Right     Family History: History reviewed. No pertinent family history.  Social History: Social History   Socioeconomic History  . Marital status: Married    Spouse name: Not on file  .  Number of children: 2  . Years of education: Not on file  . Highest education level: Not on file  Social Needs  . Financial resource strain: Not on file  . Food insecurity - worry: Not on file  . Food insecurity - inability: Not on file  . Transportation needs - medical: Not on file  . Transportation needs - non-medical: Not on file  Occupational History  . Occupation: Retired     Comment: Engineer, technical sales  Tobacco Use  . Smoking status: Former Smoker    Packs/day: 0.50  . Smokeless tobacco: Never Used  . Tobacco comment: Quit in 1978  Substance and Sexual Activity  . Alcohol use: No    Comment: 3-4 beers daily  . Drug use: No  . Sexual activity: Not Currently  Other Topics Concern  . Not on file  Social History Narrative  . Not on file    Allergies: No Known Allergies  Medications:  Current Outpatient Medications  Medication Sig Dispense Refill  . Acetylcysteine (N-ACETYL-L-CYSTEINE) POWD Swish and spit 600 mg 2 (two) times daily as needed. 2 Bottle 0  . diphenhydrAMINE (BENYLIN) 12.5 MG/5ML syrup Take 2.5 mLs (6.25 mg total) by mouth at bedtime as needed for allergies. 120 mL 0  . lidocaine (XYLOCAINE) 2 % solution Patient: Mix 1part 2% viscous lidocaine, 1part H20. Swish & swallow 11m of diluted mixture, 313m before meals and at bedtime, up to QID 100 mL 5  . lidocaine-prilocaine (EMLA) cream Apply to affected area once 30 g 3  . Multiple Vitamin (MULTIVITAMIN) tablet Take 1 tablet by mouth daily.    . Nutritional Supplements (FEEDING SUPPLEMENT, OSMOLITE 1.5 CAL,) LIQD Give 1.5 bottles Osmolite 1.5 QID with 60 mL free water before and after via PEG. Increase to 2 bottles once daily and continue 1.5 bottles TID as tolerated. Drink or flush PEG with additional 240 mL free water TID. Send supplies and formula. Call daughter, JaRoselyn Reefat [phone] arrange delivery for patient. 1541 mL 0  . prochlorperazine (COMPAZINE) 10 MG tablet Take 1 tablet (10 mg total) by mouth every 6 (six) hours as needed (Nausea or vomiting). 30 tablet 1  . LORazepam (LORAZEPAM INTENSOL) 2 MG/ML concentrated solution Take 0.5 mLs (1 mg total) by mouth every 4 (four) hours as needed for anxiety (nausea). 30 mL 0  . Morphine Sulfate (MORPHINE CONCENTRATE) 10 mg / 0.5 ml concentrated solution Take 1 mL (20 mg total) by mouth every 4 (four) hours as needed for severe pain. 240 mL 0  .  ondansetron (ZOFRAN) 8 MG tablet Take 1 tablet (8 mg total) by mouth 2 (two) times daily as needed for refractory nausea / vomiting. Start on day 3 after chemo. 30 tablet 1   No current facility-administered medications for this visit.     Review of Systems: Review of Systems  HENT:   Positive for sore throat and trouble swallowing. Negative for mouth sores.   Respiratory: Positive for cough.   Gastrointestinal: Positive for vomiting. Negative for nausea.  All other systems reviewed and are negative.    PHYSICAL EXAMINATION Blood pressure 123/87, pulse (!) 107, temperature 99.3 F (37.4 C), temperature source Oral, resp. rate 18, height '5\' 9"'$  (1.753 m), weight 162 lb 8 oz (73.7 kg), SpO2 99 %.  ECOG PERFORMANCE STATUS: 1 - Symptomatic but completely ambulatory  Physical Exam  Constitutional: He is oriented to person, place, and time and well-developed, well-nourished, and in no distress. No distress.  HENT:  Mouth/Throat:  Oropharynx is clear and moist and mucous membranes are normal. No dental abscesses.  Edentulous, wearing dentures. No visible lesion noted no anterior oral ulcerations, leukoplakia. Visible oropharynx without abnormality.  Eyes: Conjunctivae and EOM are normal. Pupils are equal, round, and reactive to light. No scleral icterus.  Neck:  Resolution of palpable lymphadenopathy in the neck, but diffuse swelling is noted.  Cardiovascular: Normal rate and regular rhythm.  No murmur heard. Pulmonary/Chest: Effort normal and breath sounds normal. No stridor. No respiratory distress. He has no wheezes. He has no rales.  Abdominal: Soft. Bowel sounds are normal. He exhibits no distension and no mass. There is no tenderness.  Musculoskeletal: Normal range of motion. He exhibits no edema.  Neurological: He is alert and oriented to person, place, and time. He has normal reflexes. No cranial nerve deficit. Coordination normal.  Skin: Skin is warm and dry. He is not diaphoretic.  There is erythema.  Progressive radiation erythema lateral neck with mild desquamation, but no blistering.     LABORATORY DATA: I have personally reviewed the data as listed: No visits with results within 1 Week(s) from this visit.  Latest known visit with results is:  Appointment on 12/01/2016  Component Date Value Ref Range Status  . WBC 12/01/2016 3.9* 4.0 - 10.3 10e3/uL Final  . NEUT# 12/01/2016 3.2  1.5 - 6.5 10e3/uL Final  . HGB 12/01/2016 11.2* 13.0 - 17.1 g/dL Final  . HCT 12/01/2016 33.2* 38.4 - 49.9 % Final  . Platelets 12/01/2016 157  140 - 400 10e3/uL Final  . MCV 12/01/2016 94.7  79.3 - 98.0 fL Final  . MCH 12/01/2016 32.0  27.2 - 33.4 pg Final  . MCHC 12/01/2016 33.8  32.0 - 36.0 g/dL Final  . RBC 12/01/2016 3.51* 4.20 - 5.82 10e6/uL Final  . RDW 12/01/2016 17.5* 11.0 - 14.6 % Final  . lymph# 12/01/2016 0.2* 0.9 - 3.3 10e3/uL Final  . MONO# 12/01/2016 0.4  0.1 - 0.9 10e3/uL Final  . Eosinophils Absolute 12/01/2016 0.0  0.0 - 0.5 10e3/uL Final  . Basophils Absolute 12/01/2016 0.0  0.0 - 0.1 10e3/uL Final  . NEUT% 12/01/2016 83.3* 39.0 - 75.0 % Final  . LYMPH% 12/01/2016 5.0* 14.0 - 49.0 % Final  . MONO% 12/01/2016 10.6  0.0 - 14.0 % Final  . EOS% 12/01/2016 0.7  0.0 - 7.0 % Final  . BASO% 12/01/2016 0.4  0.0 - 2.0 % Final  . Sodium 12/01/2016 139  136 - 145 mEq/L Final  . Potassium 12/01/2016 4.1  3.5 - 5.1 mEq/L Final  . Chloride 12/01/2016 98  98 - 109 mEq/L Final  . CO2 12/01/2016 32* 22 - 29 mEq/L Final  . Glucose 12/01/2016 135  70 - 140 mg/dl Final   Glucose reference range is for nonfasting patients. Fasting glucose reference range is 70- 100.  Marland Kitchen BUN 12/01/2016 13.6  7.0 - 26.0 mg/dL Final  . Creatinine 12/01/2016 0.6* 0.7 - 1.3 mg/dL Final  . Total Bilirubin 12/01/2016 0.33  0.20 - 1.20 mg/dL Final  . Alkaline Phosphatase 12/01/2016 99  40 - 150 U/L Final  . AST 12/01/2016 29  5 - 34 U/L Final  . ALT 12/01/2016 49  0 - 55 U/L Final  . Total Protein  12/01/2016 6.8  6.4 - 8.3 g/dL Final  . Albumin 12/01/2016 3.1* 3.5 - 5.0 g/dL Final  . Calcium 12/01/2016 9.5  8.4 - 10.4 mg/dL Final  . Anion Gap 12/01/2016 9  3 - 11 mEq/L Final  .  EGFR 12/01/2016 >60  >60 ml/min/1.73 m2 Final   eGFR is calculated using the CKD-EPI Creatinine Equation (2009)  . Magnesium 12/01/2016 2.2  1.5 - 2.5 mg/dl Final       Ardath Sax, MD

## 2017-01-17 ENCOUNTER — Ambulatory Visit: Payer: Medicare Other

## 2017-01-19 ENCOUNTER — Other Ambulatory Visit: Payer: Self-pay | Admitting: Hematology and Oncology

## 2017-01-19 DIAGNOSIS — C01 Malignant neoplasm of base of tongue: Secondary | ICD-10-CM

## 2017-01-20 ENCOUNTER — Telehealth: Payer: Self-pay | Admitting: *Deleted

## 2017-01-20 ENCOUNTER — Other Ambulatory Visit: Payer: Self-pay

## 2017-01-20 DIAGNOSIS — C01 Malignant neoplasm of base of tongue: Secondary | ICD-10-CM

## 2017-01-20 MED ORDER — LORAZEPAM 2 MG/ML PO CONC: 1.0000 mg | ORAL | 0 refills | Status: DC | PRN

## 2017-01-20 NOTE — Telephone Encounter (Signed)
Oncology Nurse Navigator Documentation  Rec'd call from pt's wife indicating he needs lorazepam refill.  I confirmed with Dr. Clydene Laming RN Lanelle Bal faxed request from [facility] has been received, in process of completion.  I called her with update.  She expressed appreciation.  Gayleen Orem, RN, BSN, [place] Oncology Nurse [place] at [place] [phone]

## 2017-01-26 ENCOUNTER — Ambulatory Visit: Payer: Medicare Other | Attending: Radiation Oncology

## 2017-01-26 ENCOUNTER — Other Ambulatory Visit: Payer: Self-pay

## 2017-01-26 DIAGNOSIS — R1310 Dysphagia, unspecified: Secondary | ICD-10-CM

## 2017-01-26 NOTE — Patient Instructions (Signed)
Signs of Aspiration Pneumonia   . Chest pain/tightness . Fever (can be low grade) . Cough  o With foul-smelling phlegm (sputum) o With sputum containing pus or blood o With greenish sputum . Fatigue  . Shortness of breath  . Wheezing   **IF YOU HAVE THESE SIGNS, CONTACT YOUR DOCTOR OR GO TO THE EMERGENCY DEPARTMENT OR URGENT CARE AS SOON AS POSSIBLE**      

## 2017-01-26 NOTE — Therapy (Signed)
[facility] [address] [place], [place], [address] Phone: [phone]   Fax:  [phone]  Speech Language Pathology Treatment  Patient Details  Name: Joel Walton MRN: [id number] Date of Birth: [birth year] Referring Provider: Eppie Gibson, MD   Encounter Date: 01/26/2017  End of Session - 01/26/17 1615    Visit Number  4    Number of Visits  5    Date for SLP Re-Evaluation  02/19/17    Authorization Type  [employer] [id number]     Authorization Time Period  10-05-16 to 04-06-17    Authorization - Visit Number  4    Authorization - Number of Visits  6       Past Medical History:  Diagnosis Date  . History of radiation therapy 09/28/16- 11/19/16   head and neck, base of tongue 70 Gy total in 35 fractions  . Hyperlipidemia   . Hypertension     Past Surgical History:  Procedure Laterality Date  . ELBOW SURGERY Right   . IR FLUORO GUIDE PORT INSERTION RIGHT  09/24/2016  . IR GASTROSTOMY TUBE MOD SED  09/24/2016  . IR US GUIDE VASC ACCESS RIGHT  09/24/2016  . KNEE SURGERY Bilateral    Not total knee replacements  . SHOULDER ARTHROSCOPY Right     There were no vitals filed for this visit.  Subjective Assessment - 01/26/17 1529    Subjective  "I've been doing my exercises but the last two or three days I've had burning with the (effortful swallow).    Patient is accompained by:  Family member daughter            ADULT SLP TREATMENT - 01/26/17 1547      General Information   Behavior/Cognition  Alert;Cooperative;Pleasant mood      Treatment Provided   Treatment provided  Dysphagia      Dysphagia Treatment   Temperature Spikes Noted  No    Oral Cavity - Dentition  Dentures, bottom;Dentures, top    Treatment Methods  Skilled observation;Therapeutic exercise;Patient/caregiver education    Patient observed directly with PO's  Yes    Type of PO's observed  Dysphagia 1 (puree) carbonated liquid    Oral Phase  Signs & Symptoms  -- none noted    Pharyngeal Phase Signs & Symptoms  -- none noted    Other treatment/comments  Pt POs Pepsi (caffeine free) and water primarily. Refrains from eating due to ageusia. SLP told pt that he should eat due to necessity and not for pleasure. Pt performed HEP with rare min A. SLP told pt he looks safe with solids such as puree - pt left session excited about soups with chicken and beans (high protein) pureed so that he could more easily swallow them. Rare min A needed with HEP. Pt told SLP 3 overt s/s aspiration PNA with modified independence.      Pain Assessment   Pain Assessment  No/denies pain      Assessment / Recommendations / Plan   Plan  Continue with current plan of care      Progression Toward Goals   Progression toward goals  Progressing toward goals       SLP Education - 01/26/17 1630    Education provided  Yes    Education Details  overt s/s aspiration PNA, food journal, HEP procedure    Person(s) Educated  Patient;Child(ren)    Methods  Explanation;Demonstration;Handout    Comprehension  Verbalized understanding;Returned demonstration  SLP Short Term Goals - 01/26/17 1632      SLP SHORT TERM GOAL #1   Title  pt will complete HEP with rare min A over two sessions    Status  Achieved      SLP SHORT TERM GOAL #2   Title  pt will tell SLP why he is completing HEP over two sessions    Baseline  01-26-17    Status  Partially Met       SLP Long Term Goals - 01/26/17 1633      SLP LONG TERM GOAL #1   Title  pt will tell SLP 3 overt s/s aspiration PNA with modified independence    Status  Achieved      SLP LONG TERM GOAL #2   Title  pt will complete HEP with modified independence over two sessions     Time  1    Period  -- vistis    Status  Revised      SLP LONG TERM GOAL #3   Title  pt will tell SLP why a food journal is helpful in returning to most liberal diet    Status  Achieved       Plan - 01/26/17 1631    Clinical  Impression Statement  Pt with oropharyngeal swallowing WFL for at least pureed/dys I items and thin liquids. Pt req'd rare min A with HEP today. SEe "skilled intervention" for more details. The probability of further swallowing difficulty increases dramatically with the initiation of chemo and radiation therapy. Pt will need to be followed by SLP for regular assessment of accurate HEP completion as well as for safety with POs both during and following treatment/s.    Speech Therapy Frequency  -- once approx every four weeks    Duration  -- two visits/6-7 total visits    Treatment/Interventions  Aspiration precaution training;Pharyngeal strengthening exercises;Diet toleration management by SLP;Compensatory techniques;Internal/external aids;SLP instruction and feedback;Patient/family education;Oral motor exercises;Trials of upgraded texture/liquids;Environmental controls any or any combination may be used    Potential to Achieve Goals  Good    SLP Home Exercise Plan  provided today    Consulted and Agree with Plan of Care  Patient       Patient will benefit from skilled therapeutic intervention in order to improve the following deficits and impairments:   Dysphagia, unspecified type    Problem List Patient Active Problem List   Diagnosis Date Noted  . Elevated liver enzymes 10/07/2016  . 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 Other constipation 09/29/2016  . Mild protein malnutrition ([place]) 09/29/2016  . Dysphagia 09/29/2016  . 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 Cancer of base of tongue ([place]) 09/14/2016    [facility] ,[place], [place]  01/26/2017, 4:33 PM  [facility] [address] [place], [place], [address] Phone: [phone]   Fax:  [phone]   Name: Joel Walton MRN: [id number] Date of Birth: 07-26-1945

## 2017-01-27 ENCOUNTER — Telehealth: Payer: Self-pay | Admitting: *Deleted

## 2017-01-27 NOTE — Telephone Encounter (Signed)
Oncology Nurse Navigator Documentation  Spoke with Mr. Munn's wife in follow-up to SLP IB re need for PT lymphedema tmt. I explained he has active referral, that I will coordinate appt with Outpatient Rehab. She noted Mr. Rayson needs lorazepam refill.  He has been using as prescribed (0.5 mL q4hr) will exhaust current Rx by Saturday.  Note routed to Dr. Irene Limbo and RN Aldona Bar.  Gayleen Orem, RN, BSN, [place] Neck Oncology Nurse [place] at [place] [phone]

## 2017-01-28 ENCOUNTER — Other Ambulatory Visit: Payer: Self-pay

## 2017-01-28 ENCOUNTER — Telehealth: Payer: Self-pay

## 2017-01-28 DIAGNOSIS — C01 Malignant neoplasm of base of tongue: Secondary | ICD-10-CM

## 2017-01-28 MED ORDER — LORAZEPAM 2 MG/ML PO CONC: 1.0000 mg | ORAL | 0 refills | Status: DC | PRN

## 2017-01-28 NOTE — Telephone Encounter (Signed)
Refill request for Ativan called in to [facility]. Verbal order received from Dr. Irene Limbo. Start date for medication is 01/30/17.

## 2017-02-15 ENCOUNTER — Encounter: Payer: Self-pay | Admitting: Physical Therapy

## 2017-02-15 ENCOUNTER — Ambulatory Visit: Payer: Medicare Other | Attending: Radiation Oncology | Admitting: Physical Therapy

## 2017-02-15 ENCOUNTER — Other Ambulatory Visit: Payer: Self-pay

## 2017-02-15 DIAGNOSIS — I890 Lymphedema, not elsewhere classified: Secondary | ICD-10-CM

## 2017-02-15 DIAGNOSIS — R293 Abnormal posture: Secondary | ICD-10-CM | POA: Insufficient documentation

## 2017-02-15 DIAGNOSIS — M6281 Muscle weakness (generalized): Secondary | ICD-10-CM | POA: Diagnosis present

## 2017-02-15 DIAGNOSIS — R1310 Dysphagia, unspecified: Secondary | ICD-10-CM | POA: Diagnosis not present

## 2017-02-15 NOTE — Therapy (Signed)
[place], [place], [address] Phone: [phone]   Fax:  [phone]  Physical Therapy Re-Evaluation  Patient Details  Name: Joel Walton MRN: [id number] Date of Birth: 05/10/1945 Referring Provider: Dr. Eppie Gibson   Encounter Date: 02/15/2017  PT End of Session - 02/15/17 1928    Visit Number  1    Number of Visits  9    PT Start Time  1430    PT Stop Time  1515    PT Time Calculation (min)  45 min       Past Medical History:  Diagnosis Date  . History of radiation therapy 09/28/16- 11/19/16   head and neck, base of tongue 70 Gy total in 35 fractions  . Hyperlipidemia   . Hypertension     Past Surgical History:  Procedure Laterality Date  . ELBOW SURGERY Right   . IR FLUORO GUIDE PORT INSERTION RIGHT  09/24/2016  . IR GASTROSTOMY TUBE MOD SED  09/24/2016  . IR US GUIDE VASC ACCESS RIGHT  09/24/2016  . KNEE SURGERY Bilateral    Not total knee replacements  . SHOULDER ARTHROSCOPY Right     There were no vitals filed for this visit.   Subjective Assessment - 02/15/17 1437    Subjective  Pt is having alot of burning in his throat and pain in his ears.  He cannot get sleep at night.  Radation stopped Oct 12. He stopped chemotherapy.  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  Former smoker. Edentulous, full dentures. Bilateral hearing loss. h/o right shoulder rotator cuff surgery and has pins in right elbow. Begain XRT 09/28/16 and is to have 35 fractions; chemo started 10/01/16. PEG/PAC placed 09/24/16.    Patient Stated Goals  to get rid of the fluid in his neck and to build his energy up     Currently in Pain?  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 Eppie Gibson    Onset Date/Surgical Date  09/28/16 started XRT    Hand Dominance  Left    Prior Therapy  none      Precautions   Precautions  Other (comment)    Precaution Comments  cancer precautions      Restrictions   Weight Bearing Restrictions  No      Balance Screen   Has the patient fallen in the past 6 months  No    Has the patient had a decrease in activity level because of a fear of falling?   No    Is the patient reluctant to leave their home because of a fear of falling?   No      Home Environment   Living Environment  Private residence    Living Arrangements  Spouse/significant other wife has MS; adult daughter there too    Type of [place]  One level      Prior Function  Level of Independence  Independent    Vocation  Retired    Leisure  no regular exercise, but does Haematologist and housework      Cognition   Overall Cognitive Status  Within Functional Limits for tasks assessed      Observation/Other Assessments   Observations  visible fullness in anterior neck  Feeding tube intact and port in right chest       Functional Tests   Functional tests  Sit to Stand      Sit to Stand   Comments  12 times in 30 seconds, below average for age mild SOB following;       Posture/Postural Control   Posture/Postural Control  Postural limitations    Postural Limitations  Forward head significant      AROM   Overall AROM Comments  --    Cervical Extension  45      Strength   Right Hand Grip (lbs)  42,59,56    Left Hand Grip (lbs)  53,55,50      Ambulation/Gait   Ambulation/Gait  Yes    Ambulation/Gait Assistance  7: Independent    Stairs  -- says he does okay on stairs    Gait Comments  gait not viewed, but patient denied any problems         LYMPHEDEMA/ONCOLOGY QUESTIONNAIRE - 02/15/17 1446      Head and Neck   4 cm superior to sternal notch around neck  45 cm    6 cm superior to sternal notch around neck  43 cm    8 cm superior to sternal notch around neck  43 cm    Other  44    Other  measured at 2 cm ( below firm area0 41.5          Objective measurements completed on examination: See above findings.      [facility] Adult PT Treatment/Exercise - 02/15/17 0001      Self-Care   Self-Care  Other Self-Care Comments    Other Self-Care Comments   encouraged pt to wear the chip pack he already has at home from previous PT visit., showed pt and daughter example of [employer] garment      Manual Therapy   Manual Lymphatic Drainage (MLD)  briefly, in sitting, short neck, schoulder circles, bilateral axillary and pectoral nodes, anterior chest, throat, neck , cheeks with explanation of process as I went along                   PT Long Term Goals - 02/15/17 1942      PT LONG TERM GOAL #1   Title  Pt /family will be independent in managing lymphedema with manual lymph drainage, use of compression and exercise     Time  4    Period  Weeks    Status  New      PT LONG TERM GOAL #2   Title  Pt will report his pain and discomfort have decreased by 50% overall     Time  4    Period  Weeks    Status  New      PT LONG TERM GOAL #3   Title  Pt will be independent in a home program for continued increase in strength and endurance     Time  4    Period  Weeks    Status  New             Plan - 02/15/17 1929  Clinical Impression Statement  Pt is seen for re-evaluation in PT after having completed radiation in October, He still uses feeding tube and has a port.  He has had progression of the swelling in his anterior neck since that time and has not been wearing his chip pack.  He has other complaints of pain and fatigue as well.  He comes in with daughter who is motivated to perform manual lymph drainage  and assist with getting a compression garment for her dad.  Pt is also very interested in doing some exericse to get stronger and help him sleep.     History and Personal Factors relevant to plan of care:  h/p right shoulder RTC repair and pins in right elbow, progressive pain and swelling after treatment for neck cancer     Clinical Presentation  Evolving    Clinical Presentation due to:  progressive pain and swelling     Clinical Decision Making  Moderate    Rehab Potential  Good    Clinical Impairments Affecting Rehab Potential  previous chemoradiation  still has feeding tube and port     PT Frequency  2x / week    PT Duration  4 weeks    PT Treatment/Interventions  Patient/family education;ADLs/Self Care Home Management;Taping;Compression bandaging;Passive range of motion;Manual techniques;Manual lymph drainage;Therapeutic exercise;Therapeutic activities    PT Next Visit Plan  Perform and teach manual lymph drainage, neck and scapular range of motion, assist with compression garment,encourage walking program  later progress to general strength exercise program and teach HEP, transition to community exercise possibly [employer] eventually     Consulted and Agree with Plan of Care  Patient;Family member/caregiver       Patient will benefit from skilled therapeutic intervention in order to improve the following deficits and impairments:  Increased edema, Decreased range of motion, Pain, Decreased knowledge of use of DME, Decreased knowledge of precautions, Decreased activity tolerance, Decreased endurance, Decreased strength, Impaired perceived functional ability, Postural dysfunction  Visit Diagnosis: Abnormal posture - Plan: PT plan of care cert/re-cert  Lymphedema, not elsewhere classified - Plan: PT plan of care cert/re-cert  Muscle weakness (generalized) - Plan: PT plan of care cert/re-cert     Problem List Patient Active Problem List   Diagnosis Date Noted  . Elevated liver  enzymes 10/07/2016  . 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 Other constipation 09/29/2016  . Mild protein malnutrition ([place]) 09/29/2016  . Dysphagia 09/29/2016  . 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 Cancer of base of tongue ([place]) 09/14/2016   Donato Heinz. Owens Shark PT  [place] [facility] 02/15/2017, 7:49 PM  [place], [place], [address] Phone: [phone]   Fax:  [phone]  Name: MARTAVIUS LUSTY MRN: [id number] Date of Birth: 09-18-1945

## 2017-02-16 ENCOUNTER — Encounter: Payer: Self-pay | Admitting: Physical Therapy

## 2017-02-16 ENCOUNTER — Ambulatory Visit: Payer: Medicare Other | Admitting: Physical Therapy

## 2017-02-16 DIAGNOSIS — R293 Abnormal posture: Secondary | ICD-10-CM

## 2017-02-16 DIAGNOSIS — I890 Lymphedema, not elsewhere classified: Secondary | ICD-10-CM

## 2017-02-16 NOTE — Patient Instructions (Addendum)
AROM: Neck Rotation    Turn head slowly to look over one shoulder, then the other. Hold each position __10-30__ seconds. Repeat _5-10___ times per set. Do _1___ sets per session. Do __2__ sessions per day.  http://orth.exer.us/294   Copyright  VHI. All rights reserved.  AROM: Neck Extension    Bend head backward. Hold _10-30___ seconds. Repeat _5-10___ times per set. Do __1__ sets per session. Do __2__ sessions per day.  http://orth.exer.us/300   Copyright  VHI. All rights reserved.  AROM: Neck Flexion    Bend head forward. Hold _10-30___ seconds. Repeat _5-10___ times per set. Do __1__ sets per session. Do _2___ sessions per day.  http://orth.exer.us/298   Copyright  VHI. All rights reserved.  AROM: Lateral Neck Flexion    Slowly tilt head toward one shoulder, then the other. Hold each position _10-30___ seconds. Repeat _5-10___ times per set. Do _1___ sets per session. Do _2___ sessions per day.  http://orth.exer.us/296   Copyright  VHI. All rights reserved.

## 2017-02-16 NOTE — Therapy (Signed)
[place], [place], [address] Phone: [phone]   Fax:  [phone]  Physical Therapy Treatment  Patient Details  Name: Joel Walton MRN: [id number] Date of Birth: Jun 29, 1945 Referring Provider: Dr. Eppie Gibson   Encounter Date: 02/16/2017  PT End of Session - 02/16/17 1721    Visit Number  2    Number of Visits  9    Date for PT Re-Evaluation  03/15/17    PT Start Time  1430    PT Stop Time  1516    PT Time Calculation (min)  46 min    Activity Tolerance  Patient tolerated treatment well    Behavior During Therapy  [facility] for tasks assessed/performed       Past Medical History:  Diagnosis Date  . History of radiation therapy 09/28/16- 11/19/16   head and neck, base of tongue 70 Gy total in 35 fractions  . Hyperlipidemia   . Hypertension     Past Surgical History:  Procedure Laterality Date  . ELBOW SURGERY Right   . IR FLUORO GUIDE PORT INSERTION RIGHT  09/24/2016  . IR GASTROSTOMY TUBE MOD SED  09/24/2016  . IR US GUIDE VASC ACCESS RIGHT  09/24/2016  . KNEE SURGERY Bilateral    Not total knee replacements  . SHOULDER ARTHROSCOPY Right     There were no vitals filed for this visit.  Subjective Assessment - 02/16/17 1432    Subjective  My throat is not feeling good today. My mouth feels bone dry.     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  Former smoker. Edentulous, full dentures. Bilateral hearing loss. h/o right shoulder rotator cuff surgery and has pins in right elbow. Begain XRT 09/28/16 and is to have 35 fractions; chemo started 10/01/16. PEG/PAC placed 09/24/16.    Patient Stated Goals  to get rid of the fluid in his neck and to build his energy up     Currently in Pain?  Yes    Pain Score  8     Pain Location  Throat    Pain Orientation  Lower    Pain Descriptors / Indicators  Burning     Pain Type  Chronic pain    Pain Onset  More than a month ago    Pain Frequency  Intermittent            LYMPHEDEMA/ONCOLOGY QUESTIONNAIRE - 02/15/17 1446      Head and Neck   4 cm superior to sternal notch around neck  45 cm    6 cm superior to sternal notch around neck  43 cm    8 cm superior to sternal notch around neck  43 cm    Other  44    Other  measured at 2 cm ( below firm area0 41.5               [facility] Adult PT Treatment/Exercise - 02/16/17 0001      Neck Exercises: Seated   Cervical Rotation  5 reps;Both with 5 sec holds    Lateral Flexion  5 reps;Both with 5 sec holds    Other Seated Exercise  cervical flexion and extension each x 5 reps with 5 sec holds      Manual Therapy   Manual Lymphatic Drainage (MLD)  in sitting: short neck, bilateral axilla, bilateral shoulder collectors, lateral and anterior neck  moving fluid towards pathways while instructing pt and daughter- dramatic decrease in swelling and fibrosis noted at end of session                  PT Long Term Goals - 02/15/17 1942      PT LONG TERM GOAL #1   Title  Pt /family will be independent in managing lymphedema with manual lymph drainage, use of compression and exercise     Time  4    Period  Weeks    Status  New      PT LONG TERM GOAL #2   Title  Pt will report his pain and discomfort have decreased by 50% overall     Time  4    Period  Weeks    Status  New      PT LONG TERM GOAL #3   Title  Pt will be independent in a home program for continued increase in strength and endurance     Time  4    Period  Weeks    Status  New         Head and Neck Clinic Goals - 10/05/16 1336      Patient will be able to verbalize understanding of a home exercise program for cervical range of motion, posture, and walking.    Status  Achieved      Patient will be able to verbalize understanding of proper sitting and standing posture.    Status  Achieved      Patient  will be able to verbalize understanding of lymphedema risk and availability of treatment for this condition.    Status  Achieved        Plan - 02/16/17 1721    Clinical Impression Statement  Issued pt gentle neck ROM exercises to do at home and instructed pt in 5 reps of each. Issued handout to perform self MLD for neck to pt and instructed pt in this while performing. Pt had signficant softening and decrease of lymphedema in anterior neck. There was no "lump" noticeable by end of session and no fibrosis noted. Pt was instructed to go home and place chip pack on this area to help keep edema down.     Rehab Potential  Good    Clinical Impairments Affecting Rehab Potential  previous chemoradiation  still has feeding tube and port     PT Frequency  2x / week    PT Duration  4 weeks    PT Treatment/Interventions  Patient/family education;ADLs/Self Care Home Management;Taping;Compression bandaging;Passive range of motion;Manual techniques;Manual lymph drainage;Therapeutic exercise;Therapeutic activities    PT Next Visit Plan  assess indep with manual lymph drainage, neck and scapular range of motion, assist with compression garment,encourage walking program  later progress to general strength exercise program and teach HEP, transition to community exercise possibly [employer] eventually     PT Home Exercise Plan  neck ROM, good posture practice, walking, neck ROM exercises    Consulted and Agree with Plan of Care  Patient       Patient will benefit from skilled therapeutic intervention in order to improve the following deficits and impairments:  Increased edema, Decreased range of motion, Pain, Decreased knowledge of use of DME, Decreased knowledge of precautions, Decreased activity tolerance, Decreased endurance, Decreased strength, Impaired perceived functional ability, Postural dysfunction  Visit Diagnosis: Lymphedema, not elsewhere classified  Abnormal posture     Problem  List Patient Active Problem List   Diagnosis Date Noted  .  Elevated liver enzymes 10/07/2016  . 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 Other constipation 09/29/2016  . Mild protein malnutrition ([place]) 09/29/2016  . Dysphagia 09/29/2016  . 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 Cancer of base of tongue ([place]) 09/14/2016    Allyson Sabal [facility] 02/16/2017, 5:24 PM  [place], [place], [address] Phone: [phone]   Fax:  [phone]  Name: Joel Walton MRN: [id number] Date of Birth: 07-18-1945  Manus Gunning, PT 02/16/17 5:24 PM

## 2017-02-17 ENCOUNTER — Other Ambulatory Visit: Payer: Self-pay

## 2017-02-17 ENCOUNTER — Inpatient Hospital Stay: Payer: Medicare Other | Attending: Hematology and Oncology | Admitting: Hematology and Oncology

## 2017-02-17 ENCOUNTER — Encounter: Payer: Self-pay | Admitting: Hematology and Oncology

## 2017-02-17 DIAGNOSIS — M542 Cervicalgia: Secondary | ICD-10-CM | POA: Insufficient documentation

## 2017-02-17 DIAGNOSIS — Z79899 Other long term (current) drug therapy: Secondary | ICD-10-CM | POA: Insufficient documentation

## 2017-02-17 DIAGNOSIS — I890 Lymphedema, not elsewhere classified: Secondary | ICD-10-CM | POA: Insufficient documentation

## 2017-02-17 DIAGNOSIS — R51 Headache: Secondary | ICD-10-CM | POA: Diagnosis not present

## 2017-02-17 DIAGNOSIS — Z87891 Personal history of nicotine dependence: Secondary | ICD-10-CM | POA: Diagnosis not present

## 2017-02-17 DIAGNOSIS — G4700 Insomnia, unspecified: Secondary | ICD-10-CM | POA: Diagnosis not present

## 2017-02-17 DIAGNOSIS — E785 Hyperlipidemia, unspecified: Secondary | ICD-10-CM | POA: Insufficient documentation

## 2017-02-17 DIAGNOSIS — I10 Essential (primary) hypertension: Secondary | ICD-10-CM | POA: Insufficient documentation

## 2017-02-17 DIAGNOSIS — C01 Malignant neoplasm of base of tongue: Secondary | ICD-10-CM | POA: Insufficient documentation

## 2017-02-17 DIAGNOSIS — Z931 Gastrostomy status: Secondary | ICD-10-CM | POA: Diagnosis not present

## 2017-02-17 DIAGNOSIS — Z9221 Personal history of antineoplastic chemotherapy: Secondary | ICD-10-CM | POA: Insufficient documentation

## 2017-02-17 DIAGNOSIS — Z923 Personal history of irradiation: Secondary | ICD-10-CM | POA: Insufficient documentation

## 2017-02-17 MED ORDER — MORPHINE SULFATE (CONCENTRATE) 10 MG /0.5 ML PO SOLN: 20.0000 mg | ORAL | 0 refills | Status: DC | PRN

## 2017-02-17 MED ORDER — LIDOCAINE VISCOUS 2 % MT SOLN: OROMUCOSAL | 5 refills | Status: DC

## 2017-02-21 ENCOUNTER — Telehealth: Payer: Self-pay | Admitting: Hematology and Oncology

## 2017-02-21 ENCOUNTER — Ambulatory Visit: Payer: Medicare Other

## 2017-02-21 ENCOUNTER — Encounter: Payer: Self-pay | Admitting: Physical Therapy

## 2017-02-21 NOTE — Telephone Encounter (Signed)
Scheduled appt per 1/11 sch msg - left voicemail for patient regarding appts.  °

## 2017-02-22 ENCOUNTER — Telehealth: Payer: Self-pay | Admitting: *Deleted

## 2017-02-22 NOTE — Telephone Encounter (Signed)
Oncology Nurse Navigator Documentation  Spoke with Joel Walton, Radiology Scheduling, scheduled PET at [facility] for 1/31 10:00 w/ 9:30 arrival, provided [facility] authorization # [id number].  Called pt's wife, provided appt information, reminded her of NPO status beginning midnight.  She voiced understanding.  Gayleen Orem, RN, BSN Head & Neck Oncology Nurse [place] at [place] [phone]

## 2017-02-23 ENCOUNTER — Encounter: Payer: Self-pay | Admitting: Physical Therapy

## 2017-02-23 ENCOUNTER — Ambulatory Visit: Payer: Medicare Other | Admitting: Physical Therapy

## 2017-02-23 DIAGNOSIS — M6281 Muscle weakness (generalized): Secondary | ICD-10-CM

## 2017-02-23 DIAGNOSIS — R293 Abnormal posture: Secondary | ICD-10-CM | POA: Diagnosis not present

## 2017-02-23 DIAGNOSIS — I890 Lymphedema, not elsewhere classified: Secondary | ICD-10-CM

## 2017-02-23 NOTE — Therapy (Signed)
[place], [place], [address] Phone: [phone]   Fax:  [phone]  Physical Therapy Treatment  Patient Details  Name: Joel Walton MRN: [id number] Date of Birth: 02/12/1945 Referring Provider: Dr. Eppie Gibson   Encounter Date: 02/23/2017  PT End of Session - 02/23/17 1214    Visit Number  3    Number of Visits  9    Date for PT Re-Evaluation  03/15/17    PT Start Time  9326    PT Stop Time  1100    PT Time Calculation (min)  45 min    Activity Tolerance  Patient tolerated treatment well    Behavior During Therapy  [facility] for tasks assessed/performed       Past Medical History:  Diagnosis Date  . History of radiation therapy 09/28/16- 11/19/16   head and neck, base of tongue 70 Gy total in 35 fractions  . Hyperlipidemia   . Hypertension     Past Surgical History:  Procedure Laterality Date  . ELBOW SURGERY Right   . IR FLUORO GUIDE PORT INSERTION RIGHT  09/24/2016  . IR GASTROSTOMY TUBE MOD SED  09/24/2016  . IR US GUIDE VASC ACCESS RIGHT  09/24/2016  . KNEE SURGERY Bilateral    Not total knee replacements  . SHOULDER ARTHROSCOPY Right     There were no vitals filed for this visit.  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  Former smoker. Edentulous, full dentures. Bilateral hearing loss. h/o right shoulder rotator cuff surgery and has pins in right elbow. Begain XRT 09/28/16 and is to have 35 fractions; chemo started 10/01/16. PEG/PAC placed 09/24/16.    Patient Stated Goals  to get rid of the fluid in his neck and to build his energy up     Currently in Pain?  Yes    Pain Score  8     Pain Location  -- everything above the shoulders hurts     Pain Type  Chronic pain                      [facility] Adult PT Treatment/Exercise - 02/23/17 0001      Neck Exercises: Seated   Other Seated Exercise  cervical lateral flexion, rotation, and extension with pursing of lips to stretch anterior neck       Lumbar Exercises: Aerobic   Tread Mill  5 minutes at 1.2 mph  0% grade with no difficulty       Knee/Hip Exercises: Standing   Wall Squat  10 reps partial squats    SLS  holding on with one hand for balance for 10 sec on each leg       Manual Therapy   Manual Therapy  Soft tissue mobilization    Soft tissue mobilization  in sitting, with Biotone, soft tissue work with some prolonged pressure on tender points in upper traps, neck and posterior axilla     Manual Lymphatic Drainage (MLD)  in sitting: short neck, bilateral axilla, bilateral shoulder collectors, lateral and anterior neck moving fluid towards pathways                  PT Long  Term Goals - 02/15/17 1942      PT LONG TERM GOAL #1   Title  Pt /family will be independent in managing lymphedema with manual lymph drainage, use of compression and exercise     Time  4    Period  Weeks    Status  New      PT LONG TERM GOAL #2   Title  Pt will report his pain and discomfort have decreased by 50% overall     Time  4    Period  Weeks    Status  New      PT LONG TERM GOAL #3   Title  Pt will be independent in a home program for continued increase in strength and endurance     Time  4    Period  Weeks    Status  New         [facility] Ne[facility] Clinic Goals - 10/05/16 1336      Patient will be able to verbalize understanding of a home exercise program for cervical range of motion, posture, and walking.    Status  Achieved      Patient will be able to verbalize understanding of proper sitting and standing posture.    Status  Achieved      Patient will be able to verbalize understanding of lymphedema risk and availability of treatment for  this condition.    Status  Achieved        Plan - 02/23/17 1214    Clinical Impression Statement  Pt reports he is doing massage at home and using his chip pack.  Daughter had a picture of him wearing it and it appeared to be on correctly. They are interested in pursuing purchasing one and is interested in more information.  Upgraded program to include soft tissue work to neck and upper back to see if this would help with his pain and pt began LE exercise and did well with walking     Rehab Potential  Good    Clinical Impairments Affecting Rehab Potential  previous chemoradiation  still has feeding tube and port     PT Frequency  2x / week    PT Duration  4 weeks    PT Next Visit Plan  assess indep with manual lymph drainage, neck and scapular range of motion, Issue information about  with compression garment, Continue with soft tissue work to neck/upper back if pt feels that is helpful encourage walking program  later progress to general strength exercise program and teach HEP, transition to community exercise possibly [employer] eventually     Consulted and Agree with Plan of Care  Patient;Family member/caregiver       Patient will benefit from skilled therapeutic intervention in order to improve the following deficits and impairments:  Increased edema, Decreased range of motion, Pain, Decreased knowledge of use of DME, Decreased knowledge of precautions, Decreased activity tolerance, Decreased endurance, Decreased strength, Impaired perceived functional ability, Postural dysfunction  Visit Diagnosis: Lymphedema, not elsewhere classified  Abnormal posture  Muscle weakness (generalized)     Problem List Patient Active Problem List   Diagnosis Date Noted  . Elevated liver enzymes 10/07/2016  . 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 Other constipation 09/29/2016  . Mild protein malnutrition ([place]) 09/29/2016  . Dysphagia 09/29/2016  . 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 Cancer of base of tongue ([place]) 09/14/2016   Joel Walton. Owens Shark, PT  Norwood Levo 02/23/2017,  12:19 PM  [place], [place], [address] Phone: [phone]   Fax:  [phone]  Name: Joel Walton MRN: [id number] Date of Birth: 02-Nov-1945

## 2017-02-24 ENCOUNTER — Inpatient Hospital Stay: Payer: Medicare Other | Admitting: Hematology and Oncology

## 2017-02-24 ENCOUNTER — Encounter: Payer: Self-pay | Admitting: Physical Therapy

## 2017-02-24 ENCOUNTER — Ambulatory Visit: Payer: Medicare Other | Admitting: Physical Therapy

## 2017-02-24 ENCOUNTER — Encounter: Payer: Self-pay | Admitting: Hematology and Oncology

## 2017-02-24 VITALS — BP 142/87 | HR 97 | Temp 99.3°F | Resp 17 | Wt 160.2 lb

## 2017-02-24 DIAGNOSIS — R51 Headache: Secondary | ICD-10-CM

## 2017-02-24 DIAGNOSIS — G893 Neoplasm related pain (acute) (chronic): Secondary | ICD-10-CM

## 2017-02-24 DIAGNOSIS — Z79899 Other long term (current) drug therapy: Secondary | ICD-10-CM

## 2017-02-24 DIAGNOSIS — M6281 Muscle weakness (generalized): Secondary | ICD-10-CM

## 2017-02-24 DIAGNOSIS — C01 Malignant neoplasm of base of tongue: Secondary | ICD-10-CM

## 2017-02-24 DIAGNOSIS — M542 Cervicalgia: Secondary | ICD-10-CM | POA: Diagnosis not present

## 2017-02-24 DIAGNOSIS — R293 Abnormal posture: Secondary | ICD-10-CM

## 2017-02-24 DIAGNOSIS — G4700 Insomnia, unspecified: Secondary | ICD-10-CM

## 2017-02-24 DIAGNOSIS — C770 Secondary and unspecified malignant neoplasm of lymph nodes of head, face and neck: Secondary | ICD-10-CM

## 2017-02-24 DIAGNOSIS — I890 Lymphedema, not elsewhere classified: Secondary | ICD-10-CM | POA: Diagnosis not present

## 2017-02-24 DIAGNOSIS — Z923 Personal history of irradiation: Secondary | ICD-10-CM

## 2017-02-24 MED ORDER — CYCLOBENZAPRINE HCL 5 MG PO TABS: 5.0000 mg | ORAL_TABLET | Freq: Every day | ORAL | 0 refills | Status: DC

## 2017-02-24 MED ORDER — ALUM & MAG HYDROXIDE-SIMETH 400-400-40 MG/5ML PO SUSP: 30.0000 mL | Freq: Four times a day (QID) | ORAL | 0 refills | Status: DC | PRN

## 2017-02-24 NOTE — Therapy (Signed)
[place], [place], [address] Phone: [phone]   Fax:  [phone]  Physical Therapy Treatment  Patient Details  Name: Joel Walton MRN: [id number] Date of Birth: 1946-01-17 Referring Provider: Dr. Eppie Gibson   Encounter Date: 02/24/2017  PT End of Session - 02/24/17 1727    Visit Number  4    Number of Visits  9    Date for PT Re-Evaluation  03/15/17    PT Start Time  3220    PT Stop Time  1515    PT Time Calculation (min)  43 min    Activity Tolerance  Patient tolerated treatment well    Behavior During Therapy  [facility] for tasks assessed/performed       Past Medical History:  Diagnosis Date  . History of radiation therapy 09/28/16- 11/19/16   head and neck, base of tongue 70 Gy total in 35 fractions  . Hyperlipidemia   . Hypertension     Past Surgical History:  Procedure Laterality Date  . ELBOW SURGERY Right   . IR FLUORO GUIDE PORT INSERTION RIGHT  09/24/2016  . IR GASTROSTOMY TUBE MOD SED  09/24/2016  . IR US GUIDE VASC ACCESS RIGHT  09/24/2016  . KNEE SURGERY Bilateral    Not total knee replacements  . SHOULDER ARTHROSCOPY Right     There were no vitals filed for this visit.  Subjective Assessment - 02/24/17 1528    Subjective  Pt went to Dr. this morning and hopes to get some medicine to get some sleep.  He is having burning in his left throat.     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  Former smoker. Edentulous, full dentures. Bilateral hearing loss. h/o right shoulder rotator cuff surgery and has pins in right elbow. Begain XRT 09/28/16 and is to have 35 fractions; chemo started 10/01/16. PEG/PAC placed 09/24/16.    Patient Stated Goals  to get rid of the fluid in his neck and to build his energy up     Currently in Pain?  Yes    Pain Score  6     Pain Location  Throat    Pain  Orientation  Mid;Lower    Pain Descriptors / Indicators  Burning    Pain Type  Chronic pain                      [facility] Adult PT Treatment/Exercise - 02/24/17 0001      Self-Care   Other Self-Care Comments   gave pt and daughter information on getting compression garment from internet.  Theu will decide and order       Shoulder Exercises: Supine   Other Supine Exercises  supie scapular series with yellow theraband  x  reps  Pt unable to do diagonal elevation with right arm       Manual Therapy   Manual Lymphatic Drainage (MLD)  in supine with head of bed elevated with towel towel roll filling in space at cervical lordosis. short neck, shoulder circles.bilateral axillary and pectoral nodes , anterior and lateral neck and chest moving fluid toward postierior neck and axillary nodes     Kinesiotex  Edema      Kinesiotix   Edema  skin kote to anterior and lateral neck.  2 pieces of 4 pronged fan shape from anteior lateral neck to posterior shoulder and  one pice with 3 slits opened on anteior neck at full area              PT Education - 02/24/17 1726    Education provided  Yes    Education Details  supine scapular series , how to get a compression garment     Person(s) Educated  Patient    Methods  Demonstration;Handout    Comprehension  Verbalized understanding          PT Long Term Goals - 02/15/17 1942      PT LONG TERM GOAL #1   Title  Pt /family will be independent in managing lymphedema with manual lymph drainage, use of compression and exercise     Time  4    Period  Weeks    Status  New      PT LONG TERM GOAL #2   Title  Pt will report his pain and discomfort have decreased by 50% overall     Time  4    Period  Weeks    Status  New      PT LONG TERM GOAL #3   Title  Pt will be independent in a home program for continued increase in strength and endurance     Time  4    Period  Weeks    Status  New         Head and Neck Clinic Goals -  10/05/16 1336      Patient will be able to verbalize understanding of a home exercise program for cervical range of motion, posture, and walking.    Status  Achieved      Patient will be able to verbalize understanding of proper sitting and standing posture.    Status  Achieved      Patient will be able to verbalize understanding of lymphedema risk and availability of treatment for this condition.    Status  Achieved        Plan - 02/24/17 1727    Clinical Impression Statement  Pt continues to have c/o pain interfering with sleep.  Offered suggestion for more cervical support and pt is hopeful he will get a med that will help.  He had reduction with MLD today and kinesiotape was used also. Pt and daughter acknowledged that will know how to remove it.  Upgraded to includel UE resistance exercise     Clinical Impairments Affecting Rehab Potential  previous chemoradiation  still has feeding tube and port     PT Frequency  2x / week    PT Duration  4 weeks    PT Treatment/Interventions  Patient/family education;ADLs/Self Care Home Management;Taping;Compression bandaging;Passive range of motion;Manual techniques;Manual lymph drainage;Therapeutic exercise;Therapeutic activities    PT Next Visit Plan  assess effect of knesiotape,and continue if helpful, assess indep with manual lymph drainage, neck and scapular range of motion, progress on compression garment, Continue with soft tissue work to neck/upper back if pt feels that is helpful encourage walking program  later progress to general strength exercise program and teach HEP, transition to community exercise possibly [employer] eventually     Consulted and Agree with Plan of Care  Patient       Patient will benefit from skilled therapeutic intervention in order to improve the following deficits and impairments:  Increased edema, Decreased range of motion, Pain, Decreased knowledge of use of DME, Decreased knowledge of  precautions, Decreased activity tolerance, Decreased endurance, Decreased strength, Impaired perceived functional ability, Postural dysfunction  Visit Diagnosis: Lymphedema, not elsewhere classified  Abnormal posture  Muscle weakness (generalized)     Problem List Patient Active Problem List   Diagnosis Date Noted  . Elevated liver enzymes 10/07/2016  . 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 Other constipation 09/29/2016  . Mild protein malnutrition ([place]) 09/29/2016  . Dysphagia 09/29/2016  . 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 Cancer of base of tongue ([place]) 09/14/2016   Donato Heinz. Owens Shark PT  Norwood Levo 02/24/2017, 5:33 PM  [place], [place], [address] Phone: [phone]   Fax:  [phone]  Name: NELS MUNN MRN: [id number] Date of Birth: October 07, 1945

## 2017-02-24 NOTE — Patient Instructions (Addendum)
Off the Shelf options:  Randolm Idol- www.raineywear.com   -small, medium or large  -submental (jaw line) circumference  -beige, white, or black  -$25-$30  Contour/[place] - [email or web address]   -Chin/neck bandage, Chin Strap  -Small, medium, or large  -Head and neck circumference   -Beige or white  - $20-$25   *Check size chart on website to assure proper size. Ask your therapist if  you need help measuring.*  Custom Garment:  [employer] - www.jovipak.com    -options are standard chin strap, extended chin strap, universal neck pad or  peri-auricular neck pad    Need to get appt to be measured at:    [facility]  [place], [place] [address]  [phone]) [phone]    Over Head Pull: Narrow and Wide Grip   [facility] [phone]   On back, knees bent, feet flat, band across thighs, elbows straight but relaxed. Pull hands apart (start). Keeping elbows straight, bring arms up and over head, hands toward floor. Keep pull steady on band. Hold momentarily. Return slowly, keeping pull steady, back to start. Then do same with a wider grip on the band (past shoulder width) Repeat _5-10__ times. Band color __yellow____   Side Pull: Double Arm   On back, knees bent, feet flat. Arms perpendicular to body, shoulder level, elbows straight but relaxed. Pull arms out to sides, elbows straight. Resistance band comes across collarbones, hands toward floor. Hold momentarily. Slowly return to starting position. Repeat _5-10__ times. Band color _yellow____   Sword   On back, knees bent, feet flat, left hand on left hip, right hand above left. Pull right arm DIAGONALLY (hip to shoulder) across chest. Bring right arm along head toward floor. Hold momentarily. Slowly return to starting position. Repeat _5-10__ times. Do with left arm. Band color _yellow_____   Shoulder Rotation: Double Arm   On back, knees bent, feet flat, elbows tucked at sides, bent 90, hands palms up. Pull hands apart and  down toward floor, keeping elbows near sides. Hold momentarily. Slowly return to starting position. Repeat _5-10__ times. Band color __yellow____

## 2017-02-28 ENCOUNTER — Ambulatory Visit: Payer: Medicare Other

## 2017-02-28 NOTE — Assessment & Plan Note (Signed)
72 y.o. 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 No evidence of distal metastatic disease at this time based on the initial PET/CT. Patient was not a candidate for cisplatin-based chemotherapy due to pre-existing hearing loss requiring hearing aids.   Patient was started on systemic chemotherapy with combination of weekly carboplatin and paclitaxel for 10/01/16 and now has completed the therapy. Patient appears to be recovering, but has developed interval lymphedema of bilateral neck.  Actively participating in physical therapy with excellent recovery of the skin.  Lymphedema is soft and easily reducible, but continues to recur so far.  Most prominent complaint is persistent pain in the mouth and throat that has burning characteristic.  Sleep is also disrupted mainly due to neck discomfort and oral pain.  Plan: --Continue Lorazepam to 1mg  PO Q6hrs PRN --Continue Acetylcysteine pwdr 600mg  PO swish/spit BID --Resume viscous lidocaine, if ineffective, will treat the pain with morphine.   --Restaging PET/CT currently scheduled for 03/10/17 --Return to my clinic in 1 week for continued symptom management.

## 2017-02-28 NOTE — Progress Notes (Signed)
[facility] Follow-up Visit:  Assessment: Cancer of base of tongue ([place]) 72 y.o. 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 No evidence of distal metastatic disease at this time based on the initial PET/CT. Patient was not a candidate for cisplatin-based chemotherapy due to pre-existing hearing loss requiring hearing aids.   Patient was started on systemic chemotherapy with combination of weekly carboplatin and paclitaxel for 10/01/16 and now has completed the therapy. Patient appears to be recovering, but has developed interval lymphedema of bilateral neck.  Actively participating in physical therapy with excellent recovery of the skin.  Lymphedema is soft and easily reducible, but continues to recur so far.  Most prominent complaint is persistent pain in the mouth and throat that has burning characteristic.  Sleep is also disrupted mainly due to neck discomfort and oral pain.  Plan: --Continue Lorazepam to '1mg'$  PO Q6hrs PRN --Continue Acetylcysteine pwdr '600mg'$  PO swish/spit BID --Resume viscous lidocaine, if ineffective, will treat the pain with morphine.   --Restaging PET/CT currently scheduled for 03/10/17 --Return to my clinic in 1 week for continued symptom management.  Voice recognition software was used and creation of this note. Despite my best effort at editing the text, some misspelling/errors may have occurred.  No orders of the defined types were placed in this encounter.   Cancer Staging Cancer of base of tongue ([place]) Staging form: Pharynx - HPV-Mediated Oropharynx, AJCC 8th Edition - Clinical: Stage II (cT2, cN2, cM0, p16: Positive) - Signed by Eppie Gibson, MD on 09/14/2016   All questions were answered.  . The patient knows to call the clinic  with any problems, questions or concerns.  This note was electronically signed.    History of Presenting Illness Joel Walton is a 72 y.o. 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 Patient is currently undergoing curative-intent treatment with chemoradiotherapy. He presents to the clinic for possible 4th week of carboplatin and paclitaxel therapy administered concurrently with radiation.  Patient returns to the clinic for continued recovery monitoring after completion of chemoradiotherapy.  Patient is starting to eat and drink better, but sleep remains an issue as patient is still experiencing degree of anxiety.  Also continues to have burning pain in the mouth and persistent and recurrent soft swelling in the anterior neck that diminished his quality of sleep.  This was evaluated by physical therapy and is consistent with lymphadenopathy which appears to be reducing easily with neck massage.  Nevertheless, it does recur rapidly according to the patient.  Oncological/hematological History:   Cancer of base of tongue ([place])   08/26/2016 Pathology Results    Tongue biopsy confirmed squamous cell carcinoma      08/27/2016 Initial Diagnosis    Cancer of base of tongue ([place]): positive for p16      09/01/2016 Imaging    CT Neck: Large necrotic an ulcerating lesion at the right base of the tongue measuring 3.9 cm. Pathological lymphadenopathy in the right neck noted.      09/07/2016 Imaging    PET-CT: Large hypermetabolic mass at the right base of the tongue with extension across the midline. Significant hypermetabolic level II right-sided lymphadenopathy, possible pathological lymphadenopathy on the left side at level II.  09/24/2016 Procedure    Ultrasound and fluoroscopically guided right internal jugular single lumen power port catheter insertion. Tip in the SVC/RA junction. Catheter ready for use       09/24/2016 Procedure    Fluoroscopic insertion of a 20-French "pull-through" gastrostomy.      09/28/2016 - 11/19/2016 Radiation Therapy    Site/dose:HN BOT // 70 Gy in 35 fractions         10/01/2016 - 11/12/2016 Chemotherapy    Carboplatin AUC2 QWk + Paclitaxel '45mg'$ /m2 QWk concurrent with radiation therapy --Wk #1, 10/01/16: --Wk #2, 10/08/16: --Wk #3, 10/14/16: --Wk #4, 10/22/16: --Wk #5, 10/29/16: --Wk #6, 11/05/16: skipped due to neutropenia --Wk #7, 11/12/16:       Medical History: Past Medical History:  Diagnosis Date  . History of radiation therapy 09/28/16- 11/19/16   head and neck, base of tongue 70 Gy total in 35 fractions  . Hyperlipidemia   . Hypertension     Surgical History: Past Surgical History:  Procedure Laterality Date  . ELBOW SURGERY Right   . IR FLUORO GUIDE PORT INSERTION RIGHT  09/24/2016  . IR GASTROSTOMY TUBE MOD SED  09/24/2016  . IR US GUIDE VASC ACCESS RIGHT  09/24/2016  . KNEE SURGERY Bilateral    Not total knee replacements  . SHOULDER ARTHROSCOPY Right     Family History: History reviewed. No pertinent family history.  Social History: Social History   Socioeconomic History  . Marital status: Married    Spouse name: Not on file  . Number of children: 2  . Years of education: Not on file  . Highest education level: Not on file  Social Needs  . Financial resource strain: Not on file  . Food insecurity - worry: Not on file  . Food insecurity - inability: Not on file  . Transportation needs - medical: Not on file  . Transportation needs - non-medical: Not on file  Occupational History  . Occupation: Retired     Comment: Personal assistant  Tobacco Use  . Smoking status: Former Smoker    Packs/day: 0.50  . Smokeless tobacco: Never Used  . Tobacco comment: Quit in 1978  Substance and Sexual Activity  . Alcohol use: No    Comment: 3-4 beers daily  . Drug use: No  . Sexual activity: Not Currently  Other Topics Concern   . Not on file  Social History Narrative  . Not on file    Allergies: No Known Allergies  Medications:  Current Outpatient Medications  Medication Sig Dispense Refill  . Acetylcysteine (N-ACETYL-L-CYSTEINE) POWD Swish and spit 600 mg 2 (two) times daily as needed. 2 Bottle 0  . alum & mag hydroxide-simeth (MAALOX ADVANCED MAX ST) 400-400-40 MG/5ML suspension Take 30 mLs by mouth every 6 (six) hours as needed for indigestion. 355 mL 0  . cyclobenzaprine (FLEXERIL) 5 MG tablet Take 1 tablet (5 mg total) by mouth at bedtime for 15 days. 30 tablet 0  . diphenhydrAMINE (BENYLIN) 12.5 MG/5ML syrup Take 2.5 mLs (6.25 mg total) by mouth at bedtime as needed for allergies. 120 mL 0  . lidocaine-prilocaine (EMLA) cream Apply to affected area once 30 g 3  . LORazepam (LORAZEPAM INTENSOL) 2 MG/ML concentrated solution Take 0.5 mLs (1 mg total) by mouth every 4 (four) hours as needed for anxiety (nausea). 30 mL 0  . Morphine Sulfate (MORPHINE CONCENTRATE) 10 mg / 0.5 ml concentrated solution Take 1 mL (20 mg total) by mouth every 4 (four) hours as needed for  severe pain. 240 mL 0  . Multiple Vitamin (MULTIVITAMIN) tablet Take 1 tablet by mouth daily.    . Nutritional Supplements (FEEDING SUPPLEMENT, OSMOLITE 1.5 CAL,) LIQD Give 1.5 bottles Osmolite 1.5 QID with 60 mL free water before and after via PEG. Increase to 2 bottles once daily and continue 1.5 bottles TID as tolerated. Drink or flush PEG with additional 240 mL free water TID. Send supplies and formula. Call daughter, [name], at [phone] to arrange delivery for patient. 1541 mL 0  . ondansetron (ZOFRAN) 8 MG tablet Take 1 tablet (8 mg total) by mouth 2 (two) times daily as needed for refractory nausea / vomiting. Start on day 3 after chemo. 30 tablet 1  . prochlorperazine (COMPAZINE) 10 MG tablet Take 1 tablet (10 mg total) by mouth every 6 (six) hours as needed (Nausea or vomiting). 30 tablet 1   No current facility-administered medications  for this visit.     Review of Systems: Review of Systems  HENT:   Positive for sore throat and trouble swallowing. Negative for mouth sores.   Respiratory: Positive for cough.   Gastrointestinal: Negative for nausea and vomiting.  All other systems reviewed and are negative.    PHYSICAL EXAMINATION Blood pressure (!) 137/95, pulse 100, temperature 99 F (37.2 C), temperature source Oral, resp. rate 18, height '5\' 9"'$  (1.753 m), weight 160 lb 1.6 oz (72.6 kg), SpO2 96 %.  ECOG PERFORMANCE STATUS: 1 - Symptomatic but completely ambulatory  Physical Exam  Constitutional: He is oriented to person, place, and time and well-developed, well-nourished, and in no distress. No distress.  HENT:  Mouth/Throat: Oropharynx is clear and moist and mucous membranes are normal. No dental abscesses.  Edentulous, wearing dentures. No visible lesion noted no anterior oral ulcerations, leukoplakia. Visible oropharynx without abnormality.  Eyes: Conjunctivae and EOM are normal. Pupils are equal, round, and reactive to light. No scleral icterus.  Neck:  Resolution of palpable lymphadenopathy in the neck, but diffuse swelling is noted.  Cardiovascular: Normal rate and regular rhythm.  No murmur heard. Pulmonary/Chest: Effort normal and breath sounds normal. No stridor. No respiratory distress. He has no wheezes. He has no rales.  Abdominal: Soft. Bowel sounds are normal. He exhibits no distension and no mass. There is no tenderness.  Musculoskeletal: Normal range of motion. He exhibits no edema.  Neurological: He is alert and oriented to person, place, and time. He has normal reflexes. No cranial nerve deficit. Coordination normal.  Skin: Skin is warm and dry. He is not diaphoretic. There is erythema.  Progressive radiation erythema lateral neck with mild desquamation, but no blistering.     LABORATORY DATA: I have personally reviewed the data as listed: No visits with results within 1 Week(s) from this  visit.  Latest known visit with results is:  Appointment on 12/01/2016  Component Date Value Ref Range Status  . WBC 12/01/2016 3.9* 4.0 - 10.3 10e3/uL Final  . NEUT# 12/01/2016 3.2  1.5 - 6.5 10e3/uL Final  . HGB 12/01/2016 11.2* 13.0 - 17.1 g/dL Final  . HCT 12/01/2016 33.2* 38.4 - 49.9 % Final  . Platelets 12/01/2016 157  140 - 400 10e3/uL Final  . MCV 12/01/2016 94.7  79.3 - 98.0 fL Final  . MCH 12/01/2016 32.0  27.2 - 33.4 pg Final  . MCHC 12/01/2016 33.8  32.0 - 36.0 g/dL Final  . RBC 12/01/2016 3.51* 4.20 - 5.82 10e6/uL Final  . RDW 12/01/2016 17.5* 11.0 - 14.6 % Final  . lymph# 12/01/2016  0.2* 0.9 - 3.3 10e3/uL Final  . MONO# 12/01/2016 0.4  0.1 - 0.9 10e3/uL Final  . Eosinophils Absolute 12/01/2016 0.0  0.0 - 0.5 10e3/uL Final  . Basophils Absolute 12/01/2016 0.0  0.0 - 0.1 10e3/uL Final  . NEUT% 12/01/2016 83.3* 39.0 - 75.0 % Final  . LYMPH% 12/01/2016 5.0* 14.0 - 49.0 % Final  . MONO% 12/01/2016 10.6  0.0 - 14.0 % Final  . EOS% 12/01/2016 0.7  0.0 - 7.0 % Final  . BASO% 12/01/2016 0.4  0.0 - 2.0 % Final  . Sodium 12/01/2016 139  136 - 145 mEq/L Final  . Potassium 12/01/2016 4.1  3.5 - 5.1 mEq/L Final  . Chloride 12/01/2016 98  98 - 109 mEq/L Final  . CO2 12/01/2016 32* 22 - 29 mEq/L Final  . Glucose 12/01/2016 135  70 - 140 mg/dl Final   Glucose reference range is for nonfasting patients. Fasting glucose reference range is 70- 100.  Marland Kitchen BUN 12/01/2016 13.6  7.0 - 26.0 mg/dL Final  . Creatinine 12/01/2016 0.6* 0.7 - 1.3 mg/dL Final  . Total Bilirubin 12/01/2016 0.33  0.20 - 1.20 mg/dL Final  . Alkaline Phosphatase 12/01/2016 99  40 - 150 U/L Final  . AST 12/01/2016 29  5 - 34 U/L Final  . ALT 12/01/2016 49  0 - 55 U/L Final  . Total Protein 12/01/2016 6.8  6.4 - 8.3 g/dL Final  . Albumin 12/01/2016 3.1* 3.5 - 5.0 g/dL Final  . Calcium 12/01/2016 9.5  8.4 - 10.4 mg/dL Final  . Anion Gap 12/01/2016 9  3 - 11 mEq/L Final  . EGFR 12/01/2016 >60  >60 ml/min/1.73 m2 Final    eGFR is calculated using the CKD-EPI Creatinine Equation (2009)  . Magnesium 12/01/2016 2.2  1.5 - 2.5 mg/dl Final       Ardath Sax, MD

## 2017-02-28 NOTE — Therapy (Signed)
[facility] [address] [place], [place], [address] Phone: [phone]   Fax:  [phone]  Patient Details  Name: Joel Walton MRN: [id number] Date of Birth: [birth year] Referring Provider:  Lorene Dy, MD  Encounter Date: 02/28/2017  Pt arrived at [facility] location instead of at [facility]. Pt will be rescheduled for ASAP. Marland Kitchen [facility] ,[place], [place]  02/28/2017, 2:19 PM  [facility] [address] [place], [place], [address] Phone: [phone]   Fax:  [phone]

## 2017-03-01 ENCOUNTER — Ambulatory Visit: Payer: Medicare Other | Admitting: Physical Therapy

## 2017-03-01 ENCOUNTER — Encounter: Payer: Self-pay | Admitting: Physical Therapy

## 2017-03-01 DIAGNOSIS — R293 Abnormal posture: Secondary | ICD-10-CM | POA: Diagnosis not present

## 2017-03-01 DIAGNOSIS — M6281 Muscle weakness (generalized): Secondary | ICD-10-CM

## 2017-03-01 DIAGNOSIS — I890 Lymphedema, not elsewhere classified: Secondary | ICD-10-CM

## 2017-03-01 NOTE — Therapy (Signed)
[place], [place], [address] Phone: [phone]   Fax:  [phone]  Physical Therapy Treatment  Patient Details  Name: Joel Walton MRN: [id number] Date of Birth: 1945/11/24 Referring Provider: Dr. Eppie Gibson   Encounter Date: 03/01/2017  PT End of Session - 03/01/17 1601    Visit Number  5    Number of Visits  9    Date for PT Re-Evaluation  03/15/17    PT Start Time  1518    PT Stop Time  1601    PT Time Calculation (min)  43 min    Activity Tolerance  Patient tolerated treatment well       Past Medical History:  Diagnosis Date  . History of radiation therapy 09/28/16- 11/19/16   head and neck, base of tongue 70 Gy total in 35 fractions  . Hyperlipidemia   . Hypertension     Past Surgical History:  Procedure Laterality Date  . ELBOW SURGERY Right   . IR FLUORO GUIDE PORT INSERTION RIGHT  09/24/2016  . IR GASTROSTOMY TUBE MOD SED  09/24/2016  . IR US GUIDE VASC ACCESS RIGHT  09/24/2016  . KNEE SURGERY Bilateral    Not total knee replacements  . SHOULDER ARTHROSCOPY Right     There were no vitals filed for this visit.  Subjective Assessment - 03/01/17 1527    Subjective  Pt  took the kinesiotape off at the night . He thinks he swelled up with it. so he does not want to put that on again  He has not ordered a neck garment yet  He sitll has burning in his throat     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  Former smoker. Edentulous, full dentures. Bilateral hearing loss. h/o right shoulder rotator cuff surgery and has pins in right elbow. Begain XRT 09/28/16 and is to have 35 fractions; chemo started 10/01/16. PEG/PAC placed 09/24/16.    Patient Stated Goals  to get rid of the fluid in his neck and to build his energy up             LYMPHEDEMA/ONCOLOGY QUESTIONNAIRE - 03/01/17 1531       Head and Neck   4 cm superior to sternal notch around neck  42.5 cm    6 cm superior to sternal notch around neck  42.7 cm    8 cm superior to sternal notch around neck  43 cm    Other  41.5    Other  measured at 2 cm ( below firm area0 41.5               [facility] Adult PT Treatment/Exercise - 03/01/17 0001      Manual Therapy   Manual Lymphatic Drainage (MLD)  in supine with head of bed elevated with towel towel roll filling in space at cervical lordosis. short neck, shoulder circles.bilateral axillary and pectoral nodes , anterior and lateral neck and chest moving fluid toward postierior neck and axillary nodes                   PT Long Term Goals - 03/01/17 1604      PT LONG TERM GOAL #1   Time  4    Period  Weeks    Status  On-going      PT LONG TERM GOAL #2   Title  Pt  will report his pain and discomfort have decreased by 50% overall     Time  4    Period  Weeks    Status  On-going      PT LONG TERM GOAL #3   Title  Pt will be independent in a home program for continued increase in strength and endurance     Status  On-going         Head and Neck Clinic Goals - 10/05/16 1336      Patient will be able to verbalize understanding of a home exercise program for cervical range of motion, posture, and walking.    Status  Achieved      Patient will be able to verbalize understanding of proper sitting and standing posture.    Status  Achieved      Patient will be able to verbalize understanding of lymphedema risk and availability of treatment for this condition.    Status  Achieved        Plan - 03/01/17 1601    Clinical Impression Statement  Pt has had some good reduction in neck circumferences especially at lower area.  Firm area is less, but still present  It appeared to be even smaller after treatment.  encouraged pt to continue exercise, but focused on MLD today will not reapply kinesiotape     Rehab Potential  Good     Clinical Impairments Affecting Rehab Potential  previous chemoradiation  still has feeding tube and port     PT Frequency  2x / week    PT Duration  4 weeks    PT Treatment/Interventions  Patient/family education;ADLs/Self Care Home Management;Taping;Compression bandaging;Passive range of motion;Manual techniques;Manual lymph drainage;Therapeutic exercise;Therapeutic activities    PT Next Visit Plan   Assess goals Focus on MLD: assess indep with self manual lymph drainage, neck and scapular range of motion, progress on compression garment, Continue with soft tissue work to neck/upper back if pt feels that is helpful encourage walking program  later progress to general strength exercise program and teach HEP, transition to community exercise possibly [employer] eventually     PT Home Exercise Plan  neck ROM, good posture practice, walking, neck ROM exercises, supine scapular series        Patient will benefit from skilled therapeutic intervention in order to improve the following deficits and impairments:  Increased edema, Decreased range of motion, Pain, Decreased knowledge of use of DME, Decreased knowledge of precautions, Decreased activity tolerance, Decreased endurance, Decreased strength, Impaired perceived functional ability, Postural dysfunction  Visit Diagnosis: Lymphedema, not elsewhere classified  Abnormal posture  Muscle weakness (generalized)     Problem List Patient Active Problem List   Diagnosis Date Noted  . Elevated liver enzymes 10/07/2016  . 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 Other constipation 09/29/2016  . Mild protein malnutrition ([place]) 09/29/2016  . Dysphagia 09/29/2016  . 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 Cancer of base of tongue ([place]) 09/14/2016   Joel Walton. Owens Shark PT  [place] Levo 03/01/2017, 4:06 PM  [place], [place], [address] Phone:  [phone]   Fax:  [phone]  Name: Joel Walton MRN: [id number] Date of Birth: 19-Mar-1945

## 2017-03-02 ENCOUNTER — Emergency Department (HOSPITAL_COMMUNITY): Payer: Medicare Other

## 2017-03-02 ENCOUNTER — Emergency Department (HOSPITAL_COMMUNITY)
Admission: EM | Admit: 2017-03-02 | Discharge: 2017-03-02 | Disposition: A | Discharge status: Home / Self Care | Payer: Medicare Other | Attending: Emergency Medicine | Admitting: Emergency Medicine

## 2017-03-02 ENCOUNTER — Encounter (HOSPITAL_COMMUNITY): Payer: Self-pay | Admitting: Family Medicine

## 2017-03-02 DIAGNOSIS — Z87891 Personal history of nicotine dependence: Secondary | ICD-10-CM | POA: Diagnosis not present

## 2017-03-02 DIAGNOSIS — I10 Essential (primary) hypertension: Secondary | ICD-10-CM | POA: Diagnosis not present

## 2017-03-02 DIAGNOSIS — Z85810 Personal history of malignant neoplasm of tongue: Secondary | ICD-10-CM | POA: Diagnosis not present

## 2017-03-02 DIAGNOSIS — Z79899 Other long term (current) drug therapy: Secondary | ICD-10-CM | POA: Insufficient documentation

## 2017-03-02 DIAGNOSIS — R509 Fever, unspecified: Secondary | ICD-10-CM | POA: Diagnosis not present

## 2017-03-02 DIAGNOSIS — I890 Lymphedema, not elsewhere classified: Secondary | ICD-10-CM | POA: Diagnosis present

## 2017-03-02 HISTORY — DX: Malignant (primary) neoplasm, unspecified: C80.1

## 2017-03-02 LAB — COMPREHENSIVE METABOLIC PANEL
ALBUMIN: 3.3 g/dL — AB (ref 3.5–5.0)
ALK PHOS: 108 U/L (ref 38–126)
ALT: 36 U/L (ref 17–63)
ANION GAP: 12 (ref 5–15)
AST: 23 U/L (ref 15–41)
BUN: 18 mg/dL (ref 6–20)
CHLORIDE: 93 mmol/L — AB (ref 101–111)
CO2: 32 mmol/L (ref 22–32)
Calcium: 9.6 mg/dL (ref 8.9–10.3)
Creatinine, Ser: 0.44 mg/dL — ABNORMAL LOW (ref 0.61–1.24)
GFR calc non Af Amer: >60 mL/min (ref >60)
Glucose, Bld: 108 mg/dL — ABNORMAL HIGH (ref 65–99)
POTASSIUM: 4 mmol/L (ref 3.5–5.1)
SODIUM: 137 mmol/L (ref 135–145)
Total Bilirubin: 0.3 mg/dL (ref 0.3–1.2)
Total Protein: 7.4 g/dL (ref 6.5–8.1)

## 2017-03-02 LAB — I-STAT CHEM 8, ED
BUN: 16 mg/dL (ref 6–20)
CHLORIDE: 93 mmol/L — AB (ref 101–111)
Calcium, Ion: 1.16 mmol/L (ref 1.15–1.40)
Creatinine, Ser: 0.6 mg/dL — ABNORMAL LOW (ref 0.61–1.24)
GLUCOSE: 104 mg/dL — AB (ref 65–99)
HEMATOCRIT: 32 % — AB (ref 39.0–52.0)
Hemoglobin: 10.9 g/dL — ABNORMAL LOW (ref 13.0–17.0)
POTASSIUM: 4 mmol/L (ref 3.5–5.1)
Sodium: 136 mmol/L (ref 135–145)
TCO2: 35 mmol/L — ABNORMAL HIGH (ref 22–32)

## 2017-03-02 LAB — CBC WITH DIFFERENTIAL/PLATELET
BASOS PCT: 0 %
Basophils Absolute: 0 10*3/uL (ref 0.0–0.1)
EOS ABS: 0 10*3/uL (ref 0.0–0.7)
EOS PCT: 1 %
HCT: 34 % — ABNORMAL LOW (ref 39.0–52.0)
HEMOGLOBIN: 11.2 g/dL — AB (ref 13.0–17.0)
Lymphocytes Relative: 13 %
Lymphs Abs: 0.6 10*3/uL — ABNORMAL LOW (ref 0.7–4.0)
MCH: 30.8 pg (ref 26.0–34.0)
MCHC: 32.9 g/dL (ref 30.0–36.0)
MCV: 93.4 fL (ref 78.0–100.0)
Monocytes Absolute: 0.3 10*3/uL (ref 0.1–1.0)
Monocytes Relative: 6 %
NEUTROS PCT: 80 %
Neutro Abs: 3.5 10*3/uL (ref 1.7–7.7)
PLATELETS: 208 10*3/uL (ref 150–400)
RBC: 3.64 MIL/uL — ABNORMAL LOW (ref 4.22–5.81)
RDW: 12.7 % (ref 11.5–15.5)
WBC: 4.4 10*3/uL (ref 4.0–10.5)

## 2017-03-02 MED ORDER — IOPAMIDOL (ISOVUE-300) INJECTION 61%
INTRAVENOUS | Status: AC
  Administered 2017-03-02: 75 mL
  Filled 2017-03-02: qty 75

## 2017-03-02 MED ORDER — DEXAMETHASONE SODIUM PHOSPHATE 10 MG/ML IJ SOLN
10.0000 mg | Freq: Once | INTRAMUSCULAR | Status: AC
  Administered 2017-03-02: 10 mg via INTRAVENOUS
  Filled 2017-03-02: qty 1

## 2017-03-02 MED ORDER — METHYLPREDNISOLONE 4 MG PO TBPK: ORAL_TABLET | ORAL | 0 refills | Status: DC

## 2017-03-02 NOTE — ED Triage Notes (Signed)
Patient is has throat cancer at the base. He received radiation and chemotherapy. He is awaiting another PET scan at the end of the month. Patient has had throat or lymphedema for over a week but got worse since last Saturday. Patient has jumbled speech but able to swallow his salvia.

## 2017-03-02 NOTE — ED Provider Notes (Signed)
[facility] DEPT Provider Note   CSN: [id number] Arrival date & time: 03/02/17  1825     History   Chief Complaint Chief Complaint  Patient presents with  . Oral Swelling    HPI Joel Walton is a 72 y.o. male.,  Chief complaint is oral swelling, pain with swallowing.  HPI: 72 year old male.  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  Went through radiation and chemotherapy through October.  Is scheduled for restaging/reevaluation with PET scan in about 1 week.  In November started developing some swelling in his neck.  This became worse about 10 days ago.  He has been getting physical therapy lymphedema treatment.  Daughter states that his neck circumference has decreased from 44-41 cm.  However, he has had some increasing difficulty with swallowing, and discomfort and swelling under his tongue.  Fever.  He is not drooling.  He is able to swallow and handle his secretions however he states it is difficult.  Per review of his chart he originally had a mucositis and has had some difficulty with mechanical dysphasia since his diagnosis due to the bulk of tumor at his tongue base.  Past Medical History:  Diagnosis Date  . Cancer ([place])    Tongue  . History of radiation therapy 09/28/16- 11/19/16   head and neck, base of tongue 70 Gy total in 35 fractions  . Hyperlipidemia   . Hypertension     Patient Active Problem List   Diagnosis Date Noted  . Elevated liver enzymes 10/07/2016  . 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 Other constipation 09/29/2016  . Mild protein malnutrition ([place]) 09/29/2016  . Dysphagia 09/29/2016  . 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 Cancer of base of tongue ([place]) 09/14/2016    Past Surgical History:  Procedure Laterality Date  . ELBOW SURGERY Right   . IR FLUORO GUIDE PORT INSERTION RIGHT  09/24/2016  . IR GASTROSTOMY TUBE MOD SED  09/24/2016  . IR US GUIDE VASC ACCESS RIGHT  09/24/2016    . KNEE SURGERY Bilateral    Not total knee replacements  . SHOULDER ARTHROSCOPY Right        Home Medications    Prior to Admission medications   Medication Sig Start Date End Date Taking? Authorizing Provider  Acetylcysteine (N-ACETYL-L-CYSTEINE) POWD Swish and spit 600 mg 2 (two) times daily as needed. 12/31/16  Yes Perlov, Marinell Blight, MD  alum & mag hydroxide-simeth (MAALOX ADVANCED MAX ST) [phone] MG/5ML suspension Take 30 mLs by mouth every 6 (six) hours as needed for indigestion. 02/24/17  Yes Perlov, Marinell Blight, MD  cyclobenzaprine (FLEXERIL) 5 MG tablet Take 1 tablet (5 mg total) by mouth at bedtime for 15 days. 02/24/17 03/11/17 Yes Perlov, Marinell Blight, MD  LORazepam (LORAZEPAM INTENSOL) 2 MG/ML concentrated solution Take 0.5 mLs (1 mg total) by mouth every 4 (four) hours as needed for anxiety (nausea). 01/30/17  Yes Brunetta Genera, MD  Morphine Sulfate (MORPHINE CONCENTRATE) 10 mg / 0.5 ml concentrated solution Take 1 mL (20 mg total) by mouth every 4 (four) hours as needed for severe pain. 02/17/17  Yes Perlov, Marinell Blight, MD  ondansetron (ZOFRAN) 8 MG tablet Take 1 tablet (8 mg total) by mouth 2 (two) times daily as needed for refractory nausea / vomiting. Start on day 3 after chemo. 12/31/16  Yes Ardath Sax, MD  diphenhydrAMINE (BENYLIN) 12.5 MG/5ML syrup Take 2.5 mLs (6.25 mg total) by mouth at bedtime as  needed for allergies. Patient not taking: Reported on 03/02/2017 10/28/16   Ardath Sax, MD  lidocaine-prilocaine (EMLA) cream Apply to affected area once Patient not taking: Reported on 03/02/2017 09/29/16   Ardath Sax, MD  methylPREDNISolone (MEDROL DOSEPAK) 4 MG TBPK tablet 6 po on day 1, decrease by 1 tab per day 03/02/17   Tanna Furry, MD  Multiple Vitamin (MULTIVITAMIN) tablet Take 1 tablet by mouth daily.    [provider]  Nutritional Supplements (FEEDING SUPPLEMENT, OSMOLITE 1.5 CAL,) LIQD Give 1.5 bottles Osmolite 1.5 QID with 60 mL free  water before and after via PEG. Increase to 2 bottles once daily and continue 1.5 bottles TID as tolerated. Drink or flush PEG with additional 240 mL free water TID. Send supplies and formula. Call daughter, [name], at [phone] to arrange delivery for patient. 10/05/16   Eppie Gibson, MD  prochlorperazine (COMPAZINE) 10 MG tablet Take 1 tablet (10 mg total) by mouth every 6 (six) hours as needed (Nausea or vomiting). Patient not taking: Reported on 03/02/2017 09/29/16   Ardath Sax, MD    Family History History reviewed. No pertinent family history.  Social History Social History   Tobacco Use  . Smoking status: Former Smoker    Packs/day: 0.50  . Smokeless tobacco: Never Used  . Tobacco comment: Quit in 1978  Substance Use Topics  . Alcohol use: No  . Drug use: No     Allergies   Patient has no known allergies.   Review of Systems Review of Systems  Constitutional: Negative for appetite change, chills, diaphoresis, fatigue and fever.  HENT: Positive for trouble swallowing. Negative for mouth sores and sore throat.        Neck swelling.  Swelling into the front of his tongue.  Difficulty swallowing  Eyes: Negative for visual disturbance.  Respiratory: Negative for cough, chest tightness, shortness of breath and wheezing.   Cardiovascular: Negative for chest pain.  Gastrointestinal: Negative for abdominal distention, abdominal pain, diarrhea, nausea and vomiting.  Endocrine: Negative for polydipsia, polyphagia and polyuria.  Genitourinary: Negative for dysuria, frequency and hematuria.  Musculoskeletal: Negative for gait problem.  Skin: Negative for color change, pallor and rash.  Neurological: Negative for dizziness, syncope, light-headedness and headaches.  Hematological: Does not bruise/bleed easily.  Psychiatric/Behavioral: Negative for behavioral problems and confusion.     Physical Exam Updated Vital Signs BP (!) 156/100 (BP Location: Left Arm)   Pulse 98    Temp 98.2 F (36.8 C)   Resp 18   Ht 5\' 9"  (1.753 m)   Wt 72.6 kg (160 lb)   SpO2 100%   BMI 23.63 kg/m   Physical Exam  Constitutional: He is oriented to person, place, and time. He appears well-developed and well-nourished. No distress.  HENT:  Head: Normocephalic.  There is some edema of the floor the mouth anteriorly just posterior to the incisors.  The posterior pharynx is normal in appearance.  Uvula is midline.  No tonsillar hypertrophy.  He is not drooling.  He can lay supine without apprehension.  Eyes: Conjunctivae are normal. Pupils are equal, round, and reactive to light. No scleral icterus.  Neck: Normal range of motion. Neck supple. No thyromegaly present.  Soft tissue swelling in the midline anterior neck.  This is not brought near it is not painful.  Cardiovascular: Normal rate and regular rhythm. Exam reveals no gallop and no friction rub.  No murmur heard. Pulmonary/Chest: Effort normal and breath sounds normal. No respiratory distress. He  has no wheezes. He has no rales.  Abdominal: Soft. Bowel sounds are normal. He exhibits no distension. There is no tenderness. There is no rebound.  Musculoskeletal: Normal range of motion.  Neurological: He is alert and oriented to person, place, and time.  Skin: Skin is warm and dry. No rash noted.  Psychiatric: He has a normal mood and affect. His behavior is normal.     ED Treatments / Results  Labs (all labs ordered are listed, but only abnormal results are displayed) Labs Reviewed  CBC WITH DIFFERENTIAL/PLATELET - Abnormal; Notable for the following components:      Result Value   RBC 3.64 (*)    Hemoglobin 11.2 (*)    HCT 34.0 (*)    Lymphs Abs 0.6 (*)    All other components within normal limits  COMPREHENSIVE METABOLIC PANEL - Abnormal; Notable for the following components:   Chloride 93 (*)    Glucose, Bld 108 (*)    Creatinine, Ser 0.44 (*)    Albumin 3.3 (*)    All other components within normal limits   I-STAT CHEM 8, ED - Abnormal; Notable for the following components:   Chloride 93 (*)    Creatinine, Ser 0.60 (*)    Glucose, Bld 104 (*)    TCO2 35 (*)    Hemoglobin 10.9 (*)    HCT 32.0 (*)    All other components within normal limits    EKG  EKG Interpretation None       Radiology Ct Soft Tissue Neck W Contrast  Result Date: 03/02/2017 CLINICAL DATA:  Head neck cancer with worsening edema. EXAM: CT NECK WITH CONTRAST TECHNIQUE: Multidetector CT imaging of the neck was performed using the standard protocol following the bolus administration of intravenous contrast. CONTRAST:  30mL ISOVUE-300 IOPAMIDOL (ISOVUE-300) INJECTION 61% COMPARISON:  CT neck 09/01/2016 FINDINGS: Pharynx and larynx: --Nasopharynx: Fossae of Rosenmuller are clear. Normal adenoid tonsils for age. --Oral cavity and oropharynx: Decreased density of the mass at the base of the tongue the mass appears mostly necrotic and measures 4.5 x 3.2 x 3.4 cm. There is persistent extension to the vallecula. --Hypopharynx: The vallecula is effaced by the edematous base of tongue soft tissues . --Larynx: No significant epiglottic thickening. The pre epiglottic fat remains effaced. False vocal cords are edematous. --Retropharyngeal space: No abscess, effusion or lymphadenopathy. Salivary glands: --Parotid: No mass lesion or inflammation. No sialolithiasis or ductal dilatation. --Submandibular: Symmetric without inflammation. No sialolithiasis or ductal dilatation. --Sublingual: Not clearly visualized. Thyroid: Normal. Lymph nodes: Previously seen enlarged right level 2A lymph node has markedly decreased in size. At this location, there is now a centrally hypodense 5 mm node (series 2, image 48). No new right-sided lymph nodes. Left level 2B node is unchanged, measuring 8 mm. A more superior left level 2B node measuring 5 mm is also unchanged. No other enlarged or abnormal density cervical lymph nodes. Vascular: Right chest wall Port-A-Cath  with tip below the field of view. Major cervical vessels are patent. Limited intracranial: Normal. Visualized orbits: Normal. Mastoids and visualized paranasal sinuses: Bilateral mastoid effusions. Mild bilateral maxillary mucosal thickening. Skeleton: Bilateral shoulder osteoarthrosis. No lytic or blastic lesions. Upper chest: Clear. Other: Subcutaneous soft tissue edema of the lower anterior neck. IMPRESSION: 1. Decreased enhancement and solid component of base of tongue mass, which has now been replaced with a primarily necrotic collection that measures 4.5 x 3.2 x 3.4 cm. This likely reflects post treatment change. 2. Markedly decreased size of metastatic  right level 2A lymph node, showing central necrosis. Unchanged left level 2B nodes without abnormal density. 3. Bilateral mastoid effusions are likely secondary to radiation therapy. 4. Edema of the anterior subcutaneous tissues of the neck and at the larynx is also likely a post treatment effect. Electronically Signed   By: Ulyses Jarred M.D.   On: 03/02/2017 22:05    Procedures Procedures (including critical care time)  Medications Ordered in ED Medications  dexamethasone (DECADRON) injection 10 mg (10 mg Intravenous Given 03/02/17 1949)  iopamidol (ISOVUE-300) 61 % injection (75 mLs  Contrast Given 03/02/17 2116)     Initial Impression / Assessment and Plan / ED Course  I have reviewed the triage vital signs and the nursing notes.  Pertinent labs & imaging results that were available during my care of the patient were reviewed by me and considered in my medical decision making (see chart for details).    Given IV Decadron.  He was here several hours and on recheck he states he his symptoms have greatly improved.  He feels that the size of the swelling not only in his mouth and his neck have mechanically improved and I would agree that.  CT scan shows edema but no airway compromise.  Does show areas of necrotic change of lymph nodes and tumor  bulk consistent with post radiation and chemotherapy.  No sign of abscess.  He is not febrile.  He has normal white blood cell count without leukocytosis.  I am not concerned that this represents infection.  With a tapering Medrol Dosepak.  If he develops fever, worsening symptoms, trouble swallowing breathing etc. he should return here immediately.  Contact his oncology tomorrow to arrange follow-up appointment  Final Clinical Impressions(s) / ED Diagnoses   Final diagnoses:  Lymphedema    ED Discharge Orders        Ordered    methylPREDNISolone (MEDROL DOSEPAK) 4 MG TBPK tablet     03/02/17 2235       Tanna Furry, MD 03/02/17 2320

## 2017-03-02 NOTE — Discharge Instructions (Signed)
Use the steroid/Medrol as described. Call your oncologist tomorrow to report any increase in swelling in for follow-up evaluation scheduling. Return to ER with worsening, difficulty breathing, other changes.

## 2017-03-03 ENCOUNTER — Encounter: Payer: Self-pay | Admitting: Internal Medicine

## 2017-03-04 ENCOUNTER — Ambulatory Visit: Payer: Medicare Other | Admitting: Physical Therapy

## 2017-03-04 DIAGNOSIS — I890 Lymphedema, not elsewhere classified: Secondary | ICD-10-CM

## 2017-03-04 DIAGNOSIS — R293 Abnormal posture: Secondary | ICD-10-CM | POA: Diagnosis not present

## 2017-03-04 NOTE — Therapy (Signed)
[facility] [place], [place], [address] Phone: [phone]   Fax:  [phone]  Physical Therapy Treatment  Patient Details  Name: Joel Walton MRN: [id number] Date of Birth: 14-Dec-1945 Referring Provider: Dr. Eppie Gibson   Encounter Date: 03/04/2017  PT End of Session - 03/04/17 1230    Visit Number  6    Number of Visits  9    Date for PT Re-Evaluation  03/15/17    PT Start Time  0927    PT Stop Time  1018    PT Time Calculation (min)  51 min    Activity Tolerance  Patient tolerated treatment well    Behavior During Therapy  [facility] for tasks assessed/performed       Past Medical History:  Diagnosis Date  . Cancer ([place])    Tongue  . History of radiation therapy 09/28/16- 11/19/16   head and neck, base of tongue 70 Gy total in 35 fractions  . Hyperlipidemia   . Hypertension     Past Surgical History:  Procedure Laterality Date  . ELBOW SURGERY Right   . IR FLUORO GUIDE PORT INSERTION RIGHT  09/24/2016  . IR GASTROSTOMY TUBE MOD SED  09/24/2016  . IR US GUIDE VASC ACCESS RIGHT  09/24/2016  . KNEE SURGERY Bilateral    Not total knee replacements  . SHOULDER ARTHROSCOPY Right     There were no vitals filed for this visit.  Subjective Assessment - 03/04/17 0929    Subjective  My tongue started swelling up and made the tongue stick out of the mouth.  Went to the ED because of that.  Had a large dose of steroid and as a result, yesterday, had a perfect day until lunchtime. Now is having burning in his ears and his head hurts.  His throat is not burning now.  Daughter wonders if there is a correlation between his starting Mylanta, which can have throat swelling as a side effect, and this worsening swelling.    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  Former smoker. Edentulous, full dentures. Bilateral hearing  loss. h/o right shoulder rotator cuff surgery and has pins in right elbow. Begain XRT 09/28/16 and is to have 35 fractions; chemo started 10/01/16. PEG/PAC placed 09/24/16.    Currently in Pain?  Yes    Pain Score  7     Pain Location  Ear and throat    Pain Orientation  Right;Left    Pain Descriptors / Indicators  Burning    Pain Relieving Factors  not sitting still                      [facility] Adult PT Treatment/Exercise - 03/04/17 0001      Self-Care   Other Self-Care Comments   suggested they call Gayleen Orem, nurse navigator, and ask about symptom management clinic visit for c/o burning discomfort      Manual Therapy   Manual Therapy  Edema management;Compression Bandaging    Manual Lymphatic Drainage (MLD)  With patient sitting in chair for his comfort today:  five diaphragmatic breaths, supraclavicular fossae, bilat. axillae, bilat. shoulder collectors, bilat. front and back of trunk near shoulders; posterolateral, lateral, anterolateral, and anterior neck; chin, cheeks, and preauricular nodes; then retraced all steps    Compression Bandaging  was able to fit patient with a donated neck compression garment  that we had here; fit was good and patient was instructed to try this initially while awake, then to try to sleep in it.             PT Education - 03/04/17 1230    Education provided  Yes    Education Details  to use compression garment (he received a donated one today) while awake at first, but if tolerating it well, to wear it for sleep    Person(s) Educated  Patient    Methods  Explanation    Comprehension  Verbalized understanding          PT Long Term Goals - 03/01/17 1604      PT LONG TERM GOAL #1   Time  4    Period  Weeks    Status  On-going      PT LONG TERM GOAL #2   Title  Pt will report his pain and discomfort have decreased by 50% overall     Time  4    Period  Weeks    Status  On-going      PT LONG TERM GOAL #3   Title  Pt will be  independent in a home program for continued increase in strength and endurance     Status  On-going         Head and Neck Clinic Goals - 10/05/16 1336      Patient will be able to verbalize understanding of a home exercise program for cervical range of motion, posture, and walking.    Status  Achieved      Patient will be able to verbalize understanding of proper sitting and standing posture.    Status  Achieved      Patient will be able to verbalize understanding of lymphedema risk and availability of treatment for this condition.    Status  Achieved        Plan - 03/04/17 1231    Clinical Impression Statement  Pt. has had a rough go of it lately, and went to the emergency department two nights ago because of increased swelling and discomfort.  There he was treated with a large dose of steroids, by his daughter's report, and had considerable relief until lunchtime the next day.  He is on a steroid dosepak now, but it is not benefitting him as much.  He c/o burning sensation in his ears, and so it was suggested to him that he call the nurse navigator and ask about going to the symptom management clinic. He fit into a donated compression garment today, so was instructed in its use.    Rehab Potential  Good    Clinical Impairments Affecting Rehab Potential  previous chemoradiation  still has feeding tube and port     PT Frequency  2x / week    PT Duration  4 weeks    PT Treatment/Interventions  Patient/family education;ADLs/Self Care Home Management;Taping;Compression bandaging;Passive range of motion;Manual techniques;Manual lymph drainage;Therapeutic exercise;Therapeutic activities    PT Next Visit Plan   Assess goals. Focus on MLD: assess indep with self manual lymph drainage, neck and scapular range of motion, use of compression garment, Continue with soft tissue work to neck/upper back if pt feels that is helpful; encourage walking program; later progress to general  strength exercise program and teach HEP, transition to community exercise possibly [employer] eventually     PT Home Exercise Plan  neck ROM, good posture practice, walking, neck ROM exercises, supine scapular series  Consulted and Agree with Plan of Care  Patient       Patient will benefit from skilled therapeutic intervention in order to improve the following deficits and impairments:  Increased edema, Decreased range of motion, Pain, Decreased knowledge of use of DME, Decreased knowledge of precautions, Decreased activity tolerance, Decreased endurance, Decreased strength, Impaired perceived functional ability, Postural dysfunction  Visit Diagnosis: Lymphedema, not elsewhere classified  Abnormal posture     Problem List Patient Active Problem List   Diagnosis Date Noted  . Elevated liver enzymes 10/07/2016  . 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 Other constipation 09/29/2016  . Mild protein malnutrition ([place]) 09/29/2016  . Dysphagia 09/29/2016  . 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 Cancer of base of tongue ([place]) 09/14/2016    SALISBURY,DONNA 03/04/2017, 12:35 PM  [place], [place], [address] Phone: [phone]   Fax:  [phone]  Name: YAMIN SWINGLER MRN: [id number] Date of Birth: Nov 24, 1945  Serafina Royals, PT 03/04/17 12:35 PM

## 2017-03-07 ENCOUNTER — Encounter: Payer: Self-pay | Admitting: Physical Therapy

## 2017-03-07 ENCOUNTER — Telehealth: Payer: Self-pay | Admitting: *Deleted

## 2017-03-07 ENCOUNTER — Ambulatory Visit: Payer: Medicare Other | Admitting: Physical Therapy

## 2017-03-07 DIAGNOSIS — I890 Lymphedema, not elsewhere classified: Secondary | ICD-10-CM

## 2017-03-07 DIAGNOSIS — R293 Abnormal posture: Secondary | ICD-10-CM | POA: Diagnosis not present

## 2017-03-07 NOTE — Progress Notes (Signed)
Joel Walton presents for follow up of radiation completed 11/19/2016 to his head and neck and BOT.   Pain issues, if any: He reports throat pain. It is burning when swallowing and talking. He reports headaches that hurt when pressure applied.  Using a feeding tube?: Yes, 6-7 cans of osmolite daily.  Weight changes, if any:  Wt Readings from Last 3 Encounters:  03/11/17 158 lb (71.7 kg)  03/02/17 160 lb (72.6 kg)  02/24/17 160 lb 3.2 oz (72.7 kg)   Swallowing issues, if any: Yes, he is only swallowing liquids.  Smoking or chewing tobacco? No Using fluoride trays daily? N/A Last ENT visit was on: Dr. Lucia Gaskins last July 2018. Other notable issues, if any:  PET 03/10/2017 Dr. Lebron Conners 03/08/17 for tongue swelling, was started on antibiotic and steroid with relief.   PT for lymphedema therapy 03/09/17. He has been discharged from PT per Mr. Meditz. They are concerned about the lymphedema. He is on prednisone at this time which helps him greatly.   BP (!) 152/101   Pulse 92   Temp 98 F (36.7 C)   Ht 5\' 9"  (1.753 m)   Wt 158 lb (71.7 kg)   SpO2 98% Comment: room air  BMI 23.33 kg/m

## 2017-03-07 NOTE — Telephone Encounter (Signed)
Navigator received voicemail from [facility] [phone]) in referrnce to this patient.  Message reports "issue" with swelling.  Previously went to ER, given prednisone.  Receiving lymphedema PT. "  Returned [name]'s call.  She will pick patient up at 3:15 from lymphedema therapy.  03-02-2017 ED ordered Medrol dose pack which worked well until yesterday and today.  He won't lie his head down due to headache to the back of his head.  He's not as bad as he was 03-02-2017 before visit to ED but he'll get bad if we don't do something.  No trouble breathing.  Speech a little off.  He can swallow but unable to wear dentures.  Call me so we'll know what to do next.."  Routing call information to collaborative nurse and provider for review for any further communication with patient and family.

## 2017-03-07 NOTE — Therapy (Signed)
[place], [place], [address] Phone: [phone]   Fax:  [phone]  Physical Therapy Treatment  Patient Details  Name: Joel Walton MRN: [id number] Date of Birth: 20-Feb-1945 Referring Provider: Dr. Eppie Gibson   Encounter Date: 03/07/2017  PT End of Session - 03/07/17 1626    Visit Number  7    Number of Visits  9    Date for PT Re-Evaluation  03/15/17    PT Start Time  3500    PT Stop Time  1515    PT Time Calculation (min)  42 min    Activity Tolerance  Patient tolerated treatment well    Behavior During Therapy  [facility] for tasks assessed/performed       Past Medical History:  Diagnosis Date  . 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 History of radiation therapy 09/28/16- 11/19/16   head and neck, base of tongue 70 Gy total in 35 fractions  . Hyperlipidemia   . Hypertension     Past Surgical History:  Procedure Laterality Date  . ELBOW SURGERY Right   . IR FLUORO GUIDE PORT INSERTION RIGHT  09/24/2016  . IR GASTROSTOMY TUBE MOD SED  09/24/2016  . IR US GUIDE VASC ACCESS RIGHT  09/24/2016  . KNEE SURGERY Bilateral    Not total knee replacements  . SHOULDER ARTHROSCOPY Right     There were no vitals filed for this visit.  Subjective Assessment - 03/07/17 1436    Subjective  I wear the compression garment for 4 hours but I can't sleep in it. I watch TV with it on. I think it is helping but it is uncomfortable.     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  Former smoker. Edentulous, full dentures. Bilateral hearing loss. h/o right shoulder rotator cuff surgery and has pins in right elbow. Begain XRT 09/28/16 and is to have 35 fractions; chemo started 10/01/16. PEG/PAC placed 09/24/16.    Patient Stated Goals  to get rid of the fluid in his neck and to build his energy up     Currently in Pain?  Yes    Pain  Score  4     Pain Location  Throat    Pain Orientation  Right;Left    Pain Descriptors / Indicators  Aching    Pain Type  Chronic pain                      [facility] Adult PT Treatment/Exercise - 03/07/17 0001      Manual Therapy   Manual Therapy  Edema management;Compression Bandaging    Manual Lymphatic Drainage (MLD)  With patient sitting in chair for his comfort today:  five diaphragmatic breaths, supraclavicular fossae, bilat. axillae, bilat. shoulder collectors, bilat. front and back of trunk near shoulders; posterolateral, lateral, anterolateral, and anterior neck; chin, cheeks, and preauricular nodes; then retraced all steps, also performed intra oral MLD to cheeks, lips, tongue and floor of mouth and issued pt instructions to perform this at home- pt had noticable decrease in intra oral swelling afterwards                  PT Long Term Goals - 03/01/17 1604      PT LONG TERM GOAL #1   Time  4    Period  Weeks    Status  On-going  PT LONG TERM GOAL #2   Title  Pt will report his pain and discomfort have decreased by 50% overall     Time  4    Period  Weeks    Status  On-going      PT LONG TERM GOAL #3   Title  Pt will be independent in a home program for continued increase in strength and endurance     Status  On-going         Head and Neck Clinic Goals - 10/05/16 1336      Patient will be able to verbalize understanding of a home exercise program for cervical range of motion, posture, and walking.    Status  Achieved      Patient will be able to verbalize understanding of proper sitting and standing posture.    Status  Achieved      Patient will be able to verbalize understanding of lymphedema risk and availability of treatment for this condition.    Status  Achieved        Plan - 03/07/17 1626    Clinical Impression Statement  Pt has increased intraoral swelling today especially at base of tongue. Today  instructed pt in MLD to address intraoral swelling and issued handout. Reduction of intraoral swelling was noticable after performing MLD to this area today.     Rehab Potential  Good    Clinical Impairments Affecting Rehab Potential  previous chemoradiation  still has feeding tube and port     PT Frequency  2x / week    PT Duration  4 weeks    PT Treatment/Interventions  Patient/family education;ADLs/Self Care Home Management;Taping;Compression bandaging;Passive range of motion;Manual techniques;Manual lymph drainage;Therapeutic exercise;Therapeutic activities    PT Next Visit Plan   Assess goals. Continue intraoral MLD, Focus on MLD: assess indep with self manual lymph drainage, neck and scapular range of motion, use of compression garment, Continue with soft tissue work to neck/upper back if pt feels that is helpful; encourage walking program; later progress to general strength exercise program and teach HEP, transition to community exercise possibly [employer] eventually     PT Home Exercise Plan  neck ROM, good posture practice, walking, neck ROM exercises, supine scapular series     Consulted and Agree with Plan of Care  Patient       Patient will benefit from skilled therapeutic intervention in order to improve the following deficits and impairments:  Increased edema, Decreased range of motion, Pain, Decreased knowledge of use of DME, Decreased knowledge of precautions, Decreased activity tolerance, Decreased endurance, Decreased strength, Impaired perceived functional ability, Postural dysfunction  Visit Diagnosis: Lymphedema, not elsewhere classified     Problem List Patient Active Problem List   Diagnosis Date Noted  . Elevated liver enzymes 10/07/2016  . 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 Other constipation 09/29/2016  . Mild protein malnutrition ([place]) 09/29/2016  . Dysphagia 09/29/2016  . 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 Cancer of base of tongue ([place])  09/14/2016    Allyson Sabal [facility] 03/07/2017, 4:28 PM  [place], [place], [address] Phone: [phone]   Fax:  [phone]  Name: Joel Walton MRN: [id number] Date of Birth: February 07, 1946  Manus Gunning, PT 03/07/17 4:28 PM

## 2017-03-08 ENCOUNTER — Other Ambulatory Visit: Payer: Self-pay

## 2017-03-08 ENCOUNTER — Telehealth: Payer: Self-pay | Admitting: Hematology and Oncology

## 2017-03-08 ENCOUNTER — Inpatient Hospital Stay (HOSPITAL_BASED_OUTPATIENT_CLINIC_OR_DEPARTMENT_OTHER): Payer: Medicare Other | Admitting: Hematology and Oncology

## 2017-03-08 ENCOUNTER — Encounter: Payer: Self-pay | Admitting: Hematology and Oncology

## 2017-03-08 ENCOUNTER — Telehealth: Payer: Self-pay

## 2017-03-08 VITALS — BP 123/86 | HR 103 | Temp 98.2°F | Resp 18 | Ht 69.0 in

## 2017-03-08 DIAGNOSIS — R51 Headache: Secondary | ICD-10-CM | POA: Diagnosis not present

## 2017-03-08 DIAGNOSIS — Z79899 Other long term (current) drug therapy: Secondary | ICD-10-CM

## 2017-03-08 DIAGNOSIS — Z923 Personal history of irradiation: Secondary | ICD-10-CM

## 2017-03-08 DIAGNOSIS — C01 Malignant neoplasm of base of tongue: Secondary | ICD-10-CM

## 2017-03-08 DIAGNOSIS — C770 Secondary and unspecified malignant neoplasm of lymph nodes of head, face and neck: Secondary | ICD-10-CM

## 2017-03-08 DIAGNOSIS — Z9221 Personal history of antineoplastic chemotherapy: Secondary | ICD-10-CM

## 2017-03-08 DIAGNOSIS — M542 Cervicalgia: Secondary | ICD-10-CM | POA: Diagnosis not present

## 2017-03-08 DIAGNOSIS — I890 Lymphedema, not elsewhere classified: Secondary | ICD-10-CM | POA: Diagnosis not present

## 2017-03-08 DIAGNOSIS — G4700 Insomnia, unspecified: Secondary | ICD-10-CM

## 2017-03-08 MED ORDER — OMEPRAZOLE 20 MG PO CPDR: 20.0000 mg | DELAYED_RELEASE_CAPSULE | Freq: Two times a day (BID) | ORAL | 0 refills | Status: AC

## 2017-03-08 MED ORDER — METHYLPREDNISOLONE SODIUM SUCC 125 MG IJ SOLR
125.0000 mg | Freq: Once | INTRAMUSCULAR | Status: AC
  Administered 2017-03-08: 125 mg via INTRAMUSCULAR

## 2017-03-08 MED ORDER — METHYLPREDNISOLONE SODIUM SUCC 125 MG IJ SOLR: 125.0000 mg | Freq: Once | INTRAMUSCULAR | Status: DC

## 2017-03-08 MED ORDER — DIPHENHYDRAMINE HCL 50 MG/ML IJ SOLN: 25.0000 mg | Freq: Once | INTRAMUSCULAR | Status: DC

## 2017-03-08 MED ORDER — PREDNISONE 20 MG PO TABS: 40.0000 mg | ORAL_TABLET | Freq: Every day | ORAL | 0 refills | Status: DC

## 2017-03-08 MED ORDER — DIPHENHYDRAMINE HCL 50 MG/ML IJ SOLN
25.0000 mg | Freq: Once | INTRAMUSCULAR | Status: AC
  Administered 2017-03-08: 25 mg via INTRAMUSCULAR

## 2017-03-08 MED ORDER — DIPHENHYDRAMINE HCL 12.5 MG/5ML PO SYRP: 25.0000 mg | ORAL_SOLUTION | Freq: Every evening | ORAL | 0 refills | Status: DC | PRN

## 2017-03-08 NOTE — Telephone Encounter (Signed)
Patients wife called to inform us of patients tongue swelling.  Patient went to the ER and was prescribed a steroid for the swelling and that helped but now he is out of the steroid and the swelling is much worse.  Patient stated his throat closed up last night and his tongue is swollen to the point of not being able to close his mouth.  Dr. Lebron Conners was notified and asked that the patient come in to see him today.  A scheduling message was sent requesting this.

## 2017-03-08 NOTE — Telephone Encounter (Signed)
Spoke with patients wife regarding appointment for today per 1/29 scheduling message

## 2017-03-09 ENCOUNTER — Ambulatory Visit: Payer: Medicare Other

## 2017-03-09 DIAGNOSIS — R293 Abnormal posture: Secondary | ICD-10-CM | POA: Diagnosis not present

## 2017-03-09 DIAGNOSIS — I890 Lymphedema, not elsewhere classified: Secondary | ICD-10-CM

## 2017-03-09 NOTE — Therapy (Signed)
[place], [place], [address] Phone: [phone]   Fax:  [phone]  Physical Therapy Treatment  Patient Details  Name: Joel Walton MRN: [id number] Date of Birth: [birth year] Referring Provider: Dr. Eppie Gibson   Encounter Date: 03/09/2017  PT End of Session - 03/09/17 1523    Visit Number  8    Number of Visits  9    Date for PT Re-Evaluation  03/15/17    PT Start Time  1440    PT Stop Time  1522    PT Time Calculation (min)  42 min    Activity Tolerance  Patient tolerated treatment well    Behavior During Therapy  [facility] for tasks assessed/performed       Past Medical History:  Diagnosis Date  . 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 History of radiation therapy 09/28/16- 11/19/16   head and neck, base of tongue 70 Gy total in 35 fractions  . Hyperlipidemia   . Hypertension     Past Surgical History:  Procedure Laterality Date  . ELBOW SURGERY Right   . IR FLUORO GUIDE PORT INSERTION RIGHT  09/24/2016  . IR GASTROSTOMY TUBE MOD SED  09/24/2016  . IR US GUIDE VASC ACCESS RIGHT  09/24/2016  . KNEE SURGERY Bilateral    Not total knee replacements  . SHOULDER ARTHROSCOPY Right     There were no vitals filed for this visit.  Subjective Assessment - 03/09/17 1443    Subjective  After I left here last time the swelling came back and it got worse so I went to the doctor and he thinks I could have an allergic reaction somewhere in my throat but isn't sure so he put me on antibiotic and steroids and they changed some of my meds. I'm really tender to touch on the Lt side of my neck, he thinks I'm allergic to something. but he said it was fine to do the manual lymph drainage today. I don't want you to do anything inside my mouth today because I'm finally having a good day and don't want to flare it up!    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  Former smoker. Edentulous, full dentures. Bilateral hearing loss. h/o right shoulder rotator cuff surgery and has pins in right elbow. Begain XRT 09/28/16 and is to have 35 fractions; chemo started 10/01/16. PEG/PAC placed 09/24/16.    Patient Stated Goals  to get rid of the fluid in his neck and to build his energy up     Currently in Pain?  No/denies            LYMPHEDEMA/ONCOLOGY QUESTIONNAIRE - 03/09/17 1514      Head and Neck   4 cm superior to sternal notch around neck  41 cm    6 cm superior to sternal notch around neck  41.8 cm    8 cm superior to sternal notch around neck  42.6 cm    Other  10 cm from sternal notch: 44.5               [facility] Adult PT Treatment/Exercise - 03/09/17 0001      Manual Therapy   Manual Lymphatic Drainage (MLD)  Pt seated in chair for his comfort: Short neck, supraclavicular fossa, bil shoulder collectors, bil axillae, anterior throat, submental and submandibular nodes, pre-a nd retroauricular nodes, bil  masseters all directing towards lateral pathways and eventually axillae.                   PT Long Term Goals - 03/01/17 1604      PT LONG TERM GOAL #1   Time  4    Period  Weeks    Status  On-going      PT LONG TERM GOAL #2   Title  Pt will report his pain and discomfort have decreased by 50% overall     Time  4    Period  Weeks    Status  On-going      PT LONG TERM GOAL #3   Title  Pt will be independent in a home program for continued increase in strength and endurance     Status  On-going         Head and Neck Clinic Goals - 10/05/16 1336      Patient will be able to verbalize understanding of a home exercise program for cervical range of motion, posture, and walking.    Status  Achieved      Patient will be able to verbalize understanding of proper sitting and standing posture.    Status  Achieved      Patient will be able to verbalize understanding of  lymphedema risk and availability of treatment for this condition.    Status  Achieved        Plan - 03/09/17 1523    Clinical Impression Statement  Pt had come in reporting going to the doctor yesterday due to tongue had really swollen up again yesterday to the point he was having trouble breathing. They put him on an antibiotic and steroid and pt reports great improvement already today, better than he's felt in awhile. So focused on manual lymph drainage with pt in sitting as supine is still uncomortable. he tolerated this very well and had good reductions with his circumference measurements at end of session when compared to last week.     Rehab Potential  Good    Clinical Impairments Affecting Rehab Potential  previous chemoradiation  still has feeding tube and port     PT Frequency  2x / week    PT Duration  4 weeks    PT Treatment/Interventions  Patient/family education;ADLs/Self Care Home Management;Taping;Compression bandaging;Passive range of motion;Manual techniques;Manual lymph drainage;Therapeutic exercise;Therapeutic activities    PT Next Visit Plan   Assess goals. Continue intraoral MLD as tolerated and prn, Focus on MLD: assess indep with self manual lymph drainage, neck and scapular range of motion, use of compression garment, Continue with soft tissue work to neck/upper back if pt feels that is helpful; encourage walking program; later progress to general strength exercise program and teach HEP, transition to community exercise possibly [employer] eventually     Consulted and Agree with Plan of Care  Patient       Patient will benefit from skilled therapeutic intervention in order to improve the following deficits and impairments:  Increased edema, Decreased range of motion, Pain, Decreased knowledge of use of DME, Decreased knowledge of precautions, Decreased activity tolerance, Decreased endurance, Decreased strength, Impaired perceived functional ability, Postural  dysfunction  Visit Diagnosis: Lymphedema, not elsewhere classified     Problem List Patient Active Problem List   Diagnosis Date Noted  . Elevated liver enzymes 10/07/2016  . 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 Other constipation 09/29/2016  . Mild protein malnutrition ([place]) 09/29/2016  . Dysphagia 09/29/2016  .  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 Cancer of base of tongue ([place]) 09/14/2016    Otelia Limes, PTA 03/09/2017, 4:46 PM  [place], [place], [address] Phone: [phone]   Fax:  [phone]  Name: Joel Walton MRN: [id number] Date of Birth: 24-Jul-1945

## 2017-03-10 ENCOUNTER — Encounter (HOSPITAL_COMMUNITY)
Admission: RE | Admit: 2017-03-10 | Discharge: 2017-03-10 | Disposition: A | Discharge status: Home / Self Care | Payer: Medicare Other | Source: Ambulatory Visit | Attending: Radiation Oncology | Admitting: Radiation Oncology

## 2017-03-10 ENCOUNTER — Encounter (HOSPITAL_COMMUNITY): Payer: Self-pay

## 2017-03-10 DIAGNOSIS — I890 Lymphedema, not elsewhere classified: Secondary | ICD-10-CM | POA: Insufficient documentation

## 2017-03-10 DIAGNOSIS — C01 Malignant neoplasm of base of tongue: Secondary | ICD-10-CM | POA: Insufficient documentation

## 2017-03-10 LAB — GLUCOSE, CAPILLARY: Glucose-Capillary: 91 mg/dL (ref 65–99)

## 2017-03-10 MED ORDER — FLUDEOXYGLUCOSE F - 18 (FDG) INJECTION
8.2000 | Freq: Once | INTRAVENOUS | Status: AC
  Administered 2017-03-10: 8.2 via INTRAVENOUS

## 2017-03-10 NOTE — Progress Notes (Signed)
[facility] Follow-up Visit:  Assessment: Cancer of base of tongue ([place]) 72 y.o. 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 No evidence of distal metastatic disease at this time based on the initial PET/CT. Patient was not a candidate for cisplatin-based chemotherapy due to pre-existing hearing loss requiring hearing aids.   Patient was started on systemic chemotherapy with combination of weekly carboplatin and paclitaxel for 10/01/16 and now has completed the therapy.  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  This is associated with new and progressive posterior and occipital neck pain, headache, and insomnia due to symptoms.  In addition, patient has burning in the throat in the mouth which occurs mostly at nighttime when he lies down consistent with likely gastroesophageal reflux.   Plan: --Continue Lorazepam to '1mg'$  PO Q6hrs PRN --Continue Acetylcysteine pwdr '600mg'$  PO swish/spit BID --Discontinue viscous lidocaine as it has not been effective in controlling patient's symptoms - Start Flexeril at nighttime to assist in neck pain control which is likely associated with some degree of muscle spasm.- -Maalox as needed for burning in the throat especially before laying down to bed. --Restaging PET/CT currently scheduled for 03/10/17 --Return to my clinic on 03/11/17, coordinated with Dr. Pearlie Oyster visit  Voice recognition software was used and creation of this note. Despite my best effort at editing the text, some misspelling/errors may have occurred.  No orders of the defined types were placed in this encounter.   Cancer Staging Cancer  of base of tongue ([place]) Staging form: Pharynx - HPV-Mediated Oropharynx, AJCC 8th Edition - Clinical: Stage II (cT2, cN2, cM0, p16: Positive) - Signed by Eppie Gibson, MD on 09/14/2016   All questions were answered.  . The patient knows to call the clinic with any problems, questions or concerns.  This note was electronically signed.    History of Presenting Illness Joel Walton is a 72 y.o. 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 Patient is currently undergoing curative-intent treatment with chemoradiotherapy. He presents to the clinic for possible 4th week of carboplatin and paclitaxel therapy administered concurrently with radiation.  Patient returns to the clinic for continued recovery monitoring after completion of chemoradiotherapy.  Patient is healing from the therapy, but continues to suffer from intermittent lymphedema in the neck that is causing some occipital and posterior cervical headaches as well as insomnia due to pain and swelling in the neck.  Currently, no shortness of breath, stridor, or wheezing.  No new oral discomfort or sores.  Continues to have burning in the mouth especially at nighttime.  Oncological/hematological History:   Cancer of base of tongue ([place])   08/26/2016 Pathology Results    Tongue biopsy confirmed squamous cell carcinoma      08/27/2016 Initial Diagnosis    Cancer of base of tongue ([place]): positive for p16      09/01/2016 Imaging    CT Neck: Large necrotic an ulcerating lesion at the right base of the tongue measuring 3.9 cm. Pathological lymphadenopathy in the right neck noted.      09/07/2016 Imaging    PET-CT: Large hypermetabolic mass at the right  base of the tongue with extension across the midline. Significant hypermetabolic level II right-sided lymphadenopathy, possible pathological lymphadenopathy on the left side at level II.      09/24/2016  Procedure    Ultrasound and fluoroscopically guided right internal jugular single lumen power port catheter insertion. Tip in the SVC/RA junction. Catheter ready for use      09/24/2016 Procedure    Fluoroscopic insertion of a 20-French "pull-through" gastrostomy.      09/28/2016 - 11/19/2016 Radiation Therapy    Site/dose:HN BOT // 70 Gy in 35 fractions         10/01/2016 - 11/12/2016 Chemotherapy    Carboplatin AUC2 QWk + Paclitaxel '45mg'$ /m2 QWk concurrent with radiation therapy --Wk #1, 10/01/16: --Wk #2, 10/08/16: --Wk #3, 10/14/16: --Wk #4, 10/22/16: --Wk #5, 10/29/16: --Wk #6, 11/05/16: skipped due to neutropenia --Wk #7, 11/12/16:       Medical History: Past Medical History:  Diagnosis Date  . Cancer ([place])    Tongue  . History of radiation therapy 09/28/16- 11/19/16   head and neck, base of tongue 70 Gy total in 35 fractions  . Hyperlipidemia   . Hypertension     Surgical History: Past Surgical History:  Procedure Laterality Date  . ELBOW SURGERY Right   . IR FLUORO GUIDE PORT INSERTION RIGHT  09/24/2016  . IR GASTROSTOMY TUBE MOD SED  09/24/2016  . IR US GUIDE VASC ACCESS RIGHT  09/24/2016  . KNEE SURGERY Bilateral    Not total knee replacements  . SHOULDER ARTHROSCOPY Right     Family History: History reviewed. No pertinent family history.  Social History: Social History   Socioeconomic History  . Marital status: Married    Spouse name: Not on file  . Number of children: 2  . Years of education: Not on file  . Highest education level: Not on file  Social Needs  . Financial resource strain: Not on file  . Food insecurity - worry: Not on file  . Food insecurity - inability: Not on file  . Transportation needs - medical: Not on file  . Transportation needs - non-medical: Not on file  Occupational History  . Occupation: Retired     Comment: Personal assistant  Tobacco Use  . Smoking status: Former Smoker    Packs/day: 0.50  . Smokeless  tobacco: Never Used  . Tobacco comment: Quit in 1978  Substance and Sexual Activity  . Alcohol use: No  . Drug use: No  . Sexual activity: Not Currently  Other Topics Concern  . Not on file  Social History Narrative  . Not on file    Allergies: No Known Allergies  Medications:  Current Outpatient Medications  Medication Sig Dispense Refill  . lidocaine-prilocaine (EMLA) cream Apply to affected area once (Patient not taking: Reported on 03/02/2017) 30 g 3  . LORazepam (LORAZEPAM INTENSOL) 2 MG/ML concentrated solution Take 0.5 mLs (1 mg total) by mouth every 4 (four) hours as needed for anxiety (nausea). 30 mL 0  . Morphine Sulfate (MORPHINE CONCENTRATE) 10 mg / 0.5 ml concentrated solution Take 1 mL (20 mg total) by mouth every 4 (four) hours as needed for severe pain. 240 mL 0  . Nutritional Supplements (FEEDING SUPPLEMENT, OSMOLITE 1.5 CAL,) LIQD Give 1.5 bottles Osmolite 1.5 QID with 60 mL free water before and after via PEG. Increase to 2 bottles once daily and continue 1.5 bottles TID as tolerated. Drink or flush PEG with additional 240 mL free water TID. Send supplies and  formula. Call daughter, [name], at [phone] to arrange delivery for patient. 1541 mL 0  . alum & mag hydroxide-simeth (MAALOX ADVANCED MAX ST) 400-400-40 MG/5ML suspension Take 30 mLs by mouth every 6 (six) hours as needed for indigestion. 355 mL 0  . diphenhydrAMINE (BENYLIN) 12.5 MG/5ML syrup Take 10 mLs (25 mg total) by mouth at bedtime as needed for allergies or sleep. 120 mL 0  . omeprazole (PRILOSEC) 20 MG capsule Take 1 capsule (20 mg total) by mouth 2 (two) times daily before a meal. 60 capsule 0  . predniSONE (DELTASONE) 20 MG tablet Take 2 tablets (40 mg total) by mouth daily with breakfast for 7 days. 14 tablet 0   No current facility-administered medications for this visit.     Review of Systems: Review of Systems  HENT:   Positive for sore throat and trouble swallowing. Negative for mouth sores.    Respiratory: Positive for cough.   Gastrointestinal: Negative for nausea and vomiting.  All other systems reviewed and are negative.    PHYSICAL EXAMINATION Blood pressure (!) 142/87, pulse 97, temperature 99.3 F (37.4 C), temperature source Oral, resp. rate 17, weight 160 lb 3.2 oz (72.7 kg), SpO2 97 %.  ECOG PERFORMANCE STATUS: 1 - Symptomatic but completely ambulatory  Physical Exam  Constitutional: He is oriented to person, place, and time and well-developed, well-nourished, and in no distress. No distress.  HENT:  Mouth/Throat: Oropharynx is clear and moist and mucous membranes are normal. No dental abscesses.  Edentulous, wearing dentures. No visible lesion noted no anterior oral ulcerations, leukoplakia. Visible oropharynx without abnormality.  Eyes: Conjunctivae and EOM are normal. Pupils are equal, round, and reactive to light. No scleral icterus.  Neck:  Resolution of palpable lymphadenopathy in the neck, but diffuse swelling is noted.  Cardiovascular: Normal rate and regular rhythm.  No murmur heard. Pulmonary/Chest: Effort normal and breath sounds normal. No stridor. No respiratory distress. He has no wheezes. He has no rales.  Abdominal: Soft. Bowel sounds are normal. He exhibits no distension and no mass. There is no tenderness.  Musculoskeletal: Normal range of motion. He exhibits no edema.  Neurological: He is alert and oriented to person, place, and time. He has normal reflexes. No cranial nerve deficit. Coordination normal.  Skin: Skin is warm and dry. He is not diaphoretic. There is erythema.  Progressive radiation erythema lateral neck with mild desquamation, but no blistering.     LABORATORY DATA: I have personally reviewed the data as listed: No visits with results within 1 Week(s) from this visit.  Latest known visit with results is:  Appointment on 12/01/2016  Component Date Value Ref Range Status  . WBC 12/01/2016 3.9* 4.0 - 10.3 10e3/uL Final  . NEUT#  12/01/2016 3.2  1.5 - 6.5 10e3/uL Final  . HGB 12/01/2016 11.2* 13.0 - 17.1 g/dL Final  . HCT 12/01/2016 33.2* 38.4 - 49.9 % Final  . Platelets 12/01/2016 157  140 - 400 10e3/uL Final  . MCV 12/01/2016 94.7  79.3 - 98.0 fL Final  . MCH 12/01/2016 32.0  27.2 - 33.4 pg Final  . MCHC 12/01/2016 33.8  32.0 - 36.0 g/dL Final  . RBC 12/01/2016 3.51* 4.20 - 5.82 10e6/uL Final  . RDW 12/01/2016 17.5* 11.0 - 14.6 % Final  . lymph# 12/01/2016 0.2* 0.9 - 3.3 10e3/uL Final  . MONO# 12/01/2016 0.4  0.1 - 0.9 10e3/uL Final  . Eosinophils Absolute 12/01/2016 0.0  0.0 - 0.5 10e3/uL Final  . Basophils Absolute 12/01/2016 0.0  0.0 - 0.1 10e3/uL Final  . NEUT% 12/01/2016 83.3* 39.0 - 75.0 % Final  . LYMPH% 12/01/2016 5.0* 14.0 - 49.0 % Final  . MONO% 12/01/2016 10.6  0.0 - 14.0 % Final  . EOS% 12/01/2016 0.7  0.0 - 7.0 % Final  . BASO% 12/01/2016 0.4  0.0 - 2.0 % Final  . Sodium 12/01/2016 139  136 - 145 mEq/L Final  . Potassium 12/01/2016 4.1  3.5 - 5.1 mEq/L Final  . Chloride 12/01/2016 98  98 - 109 mEq/L Final  . CO2 12/01/2016 32* 22 - 29 mEq/L Final  . Glucose 12/01/2016 135  70 - 140 mg/dl Final   Glucose reference range is for nonfasting patients. Fasting glucose reference range is 70- 100.  Marland Kitchen BUN 12/01/2016 13.6  7.0 - 26.0 mg/dL Final  . Creatinine 12/01/2016 0.6* 0.7 - 1.3 mg/dL Final  . Total Bilirubin 12/01/2016 0.33  0.20 - 1.20 mg/dL Final  . Alkaline Phosphatase 12/01/2016 99  40 - 150 U/L Final  . AST 12/01/2016 29  5 - 34 U/L Final  . ALT 12/01/2016 49  0 - 55 U/L Final  . Total Protein 12/01/2016 6.8  6.4 - 8.3 g/dL Final  . Albumin 12/01/2016 3.1* 3.5 - 5.0 g/dL Final  . Calcium 12/01/2016 9.5  8.4 - 10.4 mg/dL Final  . Anion Gap 12/01/2016 9  3 - 11 mEq/L Final  . EGFR 12/01/2016 >60  >60 ml/min/1.73 m2 Final   eGFR is calculated using the CKD-EPI Creatinine Equation (2009)  . Magnesium 12/01/2016 2.2  1.5 - 2.5 mg/dl Final       Ardath Sax, MD

## 2017-03-10 NOTE — Assessment & Plan Note (Signed)
72 y.o. 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 No evidence of distal metastatic disease at this time based on the initial PET/CT. Patient was not a candidate for cisplatin-based chemotherapy due to pre-existing hearing loss requiring hearing aids.   Patient was started on systemic chemotherapy with combination of weekly carboplatin and paclitaxel for 10/01/16 and now has completed the therapy.  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  This is associated with new and progressive posterior and occipital neck pain, headache, and insomnia due to symptoms.  In addition, patient has burning in the throat in the mouth which occurs mostly at nighttime when he lies down consistent with likely gastroesophageal reflux.   Plan: --Continue Lorazepam to 1mg  PO Q6hrs PRN --Continue Acetylcysteine pwdr 600mg  PO swish/spit BID --Discontinue viscous lidocaine as it has not been effective in controlling patient's symptoms - Start Flexeril at nighttime to assist in neck pain control which is likely associated with some degree of muscle spasm.- -Maalox as needed for burning in the throat especially before laying down to bed. --Restaging PET/CT currently scheduled for 03/10/17 --Return to my clinic on 03/11/17, coordinated with Dr. Pearlie Oyster visit

## 2017-03-10 NOTE — Progress Notes (Signed)
[facility] Follow-up Visit:  Assessment: Lymphedema of face 72 y.o. 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 No evidence of distal metastatic disease at this time based on the initial PET/CT. Patient was not a candidate for cisplatin-based chemotherapy due to pre-existing hearing loss requiring hearing aids.  Patient has completed chemoradiation therapy and is now in recovery phase which has been progressing with significant residual symptoms.    At the present time, the symptoms are dominated by recurrent swelling in the neck, now extending into the oral cavity including the swelling of the tongue making speech and swallowing difficult.  At this time, no evident airway compromise, no stridor, and no expiratory wheezing on lung auscultation.  Not entirely sure if this is just progressive edema or patient has developed angioedema as an allergic reaction to 1 of his medications or environmental factor.  Appears to be responsive to steroid therapy given to him during the emergency room visit several days prior.  Swelling recurred shortly after discontinuation of steroids.  Plan: --Continue Lorazepam to '1mg'$  PO Q6hrs PRN --Discontinue acetylcysteine, cyclobenzaprine, ondansetron, Compazine.  These medications are no longer necessary and we need to simplify our drug management. -Maalox as needed for burning in the throat especially before laying down to bed. -Methylprednisolone 125 mg IM plus diphenhydramine 25 mg IM x1 now -Patient will start prednisone 40 mg daily times 7 days followed by slower taper tomorrow due to initiation of high-dose steroids,; will start patient on omeprazole 20 mg twice a day for stress ulcer prophylaxis. -Patient will take diphenhydramine 25 mg at  nighttime for dual purpose of augmentation of anti-swelling therapy as well as sleep assistance --Restaging PET/CT currently scheduled for 03/10/17 --Return to my clinic on 03/11/17: Review of PET/CT, continued symptom management.  Voice recognition software was used and creation of this note. Despite my best effort at editing the text, some misspelling/errors may have occurred.  No orders of the defined types were placed in this encounter.   Cancer Staging Cancer of base of tongue ([place]) Staging form: Pharynx - HPV-Mediated Oropharynx, AJCC 8th Edition - Clinical: Stage II (cT2, cN2, cM0, p16: Positive) - Signed by Eppie Gibson, MD on 09/14/2016   All questions were answered.  . The patient knows to call the clinic with any problems, questions or concerns.  This note was electronically signed.    History of Presenting Illness Joel Walton is a 72 y.o. 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 Patient is currently undergoing curative-intent treatment with chemoradiotherapy. He presents to the clinic for possible 4th week of carboplatin and paclitaxel therapy administered concurrently with radiation.  Patient returns to the clinic due to recent development of progressive swelling not only in the neck, but also now affecting the gums and tongue in his mouth.  Patient is very anxious about his symptoms although does not really have any breathing compromise at this point in time.  It appears that he visited with emergency room a couple of days ago with the same symptoms and receive Medrol pack which helped his swelling significantly.  Now that the Medrol pack was down, his symptoms are returning and patient would like to address them.  He continues to have severe headaches  in the neck and in the occipital region.  Continues to have difficulty sleeping.  Patient denies any new medications other than what  has been prescribed during the last visit in our clinic which would be cyclobenzaprine.  No nausea, or vomiting.  Oncological/hematological History:   Cancer of base of tongue ([place])   08/26/2016 Pathology Results    Tongue biopsy confirmed squamous cell carcinoma      08/27/2016 Initial Diagnosis    Cancer of base of tongue ([place]): positive for p16      09/01/2016 Imaging    CT Neck: Large necrotic an ulcerating lesion at the right base of the tongue measuring 3.9 cm. Pathological lymphadenopathy in the right neck noted.      09/07/2016 Imaging    PET-CT: Large hypermetabolic mass at the right base of the tongue with extension across the midline. Significant hypermetabolic level II right-sided lymphadenopathy, possible pathological lymphadenopathy on the left side at level II.      09/24/2016 Procedure    Ultrasound and fluoroscopically guided right internal jugular single lumen power port catheter insertion. Tip in the SVC/RA junction. Catheter ready for use      09/24/2016 Procedure    Fluoroscopic insertion of a 20-French "pull-through" gastrostomy.      09/28/2016 - 11/19/2016 Radiation Therapy    Site/dose:HN BOT // 70 Gy in 35 fractions         10/01/2016 - 11/12/2016 Chemotherapy    Carboplatin AUC2 QWk + Paclitaxel '45mg'$ /m2 QWk concurrent with radiation therapy --Wk #1, 10/01/16: --Wk #2, 10/08/16: --Wk #3, 10/14/16: --Wk #4, 10/22/16: --Wk #5, 10/29/16: --Wk #6, 11/05/16: skipped due to neutropenia --Wk #7, 11/12/16:       Medical History: Past Medical History:  Diagnosis Date  . Cancer ([place])    Tongue  . History of radiation therapy 09/28/16- 11/19/16   head and neck, base of tongue 70 Gy total in 35 fractions  . Hyperlipidemia   . Hypertension     Surgical History: Past Surgical History:  Procedure Laterality Date  . ELBOW SURGERY Right   . IR FLUORO GUIDE PORT INSERTION RIGHT  09/24/2016  . IR GASTROSTOMY TUBE MOD SED  09/24/2016  . IR US GUIDE VASC  ACCESS RIGHT  09/24/2016  . KNEE SURGERY Bilateral    Not total knee replacements  . SHOULDER ARTHROSCOPY Right     Family History: History reviewed. No pertinent family history.  Social History: Social History   Socioeconomic History  . Marital status: Married    Spouse name: Not on file  . Number of children: 2  . Years of education: Not on file  . Highest education level: Not on file  Social Needs  . Financial resource strain: Not on file  . Food insecurity - worry: Not on file  . Food insecurity - inability: Not on file  . Transportation needs - medical: Not on file  . Transportation needs - non-medical: Not on file  Occupational History  . Occupation: Retired     Comment: Personal assistant  Tobacco Use  . Smoking status: Former Smoker    Packs/day: 0.50  . Smokeless tobacco: Never Used  . Tobacco comment: Quit in 1978  Substance and Sexual Activity  . Alcohol use: No  . Drug use: No  . Sexual activity: Not Currently  Other Topics Concern  . Not on file  Social History Narrative  . Not on file    Allergies: No Known Allergies  Medications:  Current Outpatient Medications  Medication Sig Dispense Refill  . alum & mag hydroxide-simeth (MAALOX ADVANCED MAX ST) 400-400-40 MG/5ML suspension Take 30 mLs by mouth every 6 (six) hours as needed for indigestion. 355 mL 0  . LORazepam (LORAZEPAM INTENSOL) 2 MG/ML concentrated solution Take 0.5 mLs (1 mg total) by mouth every 4 (four) hours as needed for anxiety (nausea). 30 mL 0  . Morphine Sulfate (MORPHINE CONCENTRATE) 10 mg / 0.5 ml concentrated solution Take 1 mL (20 mg total) by mouth every 4 (four) hours as needed for severe pain. 240 mL 0  . Nutritional Supplements (FEEDING SUPPLEMENT, OSMOLITE 1.5 CAL,) LIQD Give 1.5 bottles Osmolite 1.5 QID with 60 mL free water before and after via PEG. Increase to 2 bottles once daily and continue 1.5 bottles TID as tolerated. Drink or flush PEG with additional 240 mL free  water TID. Send supplies and formula. Call daughter, [name], at [phone] to arrange delivery for patient. 1541 mL 0  . diphenhydrAMINE (BENYLIN) 12.5 MG/5ML syrup Take 10 mLs (25 mg total) by mouth at bedtime as needed for allergies or sleep. 120 mL 0  . lidocaine-prilocaine (EMLA) cream Apply to affected area once (Patient not taking: Reported on 03/02/2017) 30 g 3  . omeprazole (PRILOSEC) 20 MG capsule Take 1 capsule (20 mg total) by mouth 2 (two) times daily before a meal. 60 capsule 0  . predniSONE (DELTASONE) 20 MG tablet Take 2 tablets (40 mg total) by mouth daily with breakfast for 7 days. 14 tablet 0   No current facility-administered medications for this visit.     Review of Systems: Review of Systems  Constitutional: Negative for chills and diaphoresis.  HENT:   Positive for sore throat and trouble swallowing. Negative for mouth sores.   Respiratory: Positive for cough. Negative for chest tightness, shortness of breath and wheezing.   Gastrointestinal: Negative for nausea and vomiting.  All other systems reviewed and are negative.    PHYSICAL EXAMINATION Blood pressure 123/86, pulse (!) 103, temperature 98.2 F (36.8 C), temperature source Oral, resp. rate 18, height '5\' 9"'$  (1.753 m), SpO2 99 %.  ECOG PERFORMANCE STATUS: 2 - Symptomatic, <50% confined to bed  Physical Exam  Constitutional: He is oriented to person, place, and time and well-developed, well-nourished, and in no distress. No distress.  HENT:  Mouth/Throat: Oropharynx is clear and moist and mucous membranes are normal. No dental abscesses. No oropharyngeal exudate.  Edentulous, not wearing dentures. No visible lesion noted no anterior oral ulcerations, leukoplakia. Visible oropharynx without abnormality.  Significant oral edema of the gums and tongue noted.  Eyes: Conjunctivae and EOM are normal. Pupils are equal, round, and reactive to light. No scleral icterus.  Neck:  Resolution of palpable lymphadenopathy  in the neck, but diffuse swelling is noted, unchanged from the previous visit.  Appearance of skin is significantly better with resolution of previously noted postradiation erythema.  Cardiovascular: Normal rate and regular rhythm.  No murmur heard. Pulmonary/Chest: Effort normal and breath sounds normal. No stridor. No respiratory distress. He has no wheezes. He has no rales.  Abdominal: Soft. Bowel sounds are normal. He exhibits no distension and no mass. There is no tenderness.  Musculoskeletal: Normal range of motion. He exhibits no edema.  Lymphadenopathy:    He has no cervical adenopathy.  Neurological: He is alert and oriented to person, place, and time. He has normal reflexes. No cranial nerve deficit. Coordination normal.  Skin: Skin is warm and dry. He is not diaphoretic. No erythema.  Progressive radiation  erythema lateral neck with mild desquamation, but no blistering.     LABORATORY DATA: I have personally reviewed the data as listed: Admission on 03/02/2017, Discharged on 03/02/2017  Component Date Value Ref Range Status  . WBC 03/02/2017 4.4  4.0 - 10.5 K/uL Final  . RBC 03/02/2017 3.64* 4.22 - 5.81 MIL/uL Final  . Hemoglobin 03/02/2017 11.2* 13.0 - 17.0 g/dL Final  . HCT 03/02/2017 34.0* 39.0 - 52.0 % Final  . MCV 03/02/2017 93.4  78.0 - 100.0 fL Final  . MCH 03/02/2017 30.8  26.0 - 34.0 pg Final  . MCHC 03/02/2017 32.9  30.0 - 36.0 g/dL Final  . RDW 03/02/2017 12.7  11.5 - 15.5 % Final  . Platelets 03/02/2017 208  150 - 400 K/uL Final  . Neutrophils Relative % 03/02/2017 80  % Final  . Neutro Abs 03/02/2017 3.5  1.7 - 7.7 K/uL Final  . Lymphocytes Relative 03/02/2017 13  % Final  . Lymphs Abs 03/02/2017 0.6* 0.7 - 4.0 K/uL Final  . Monocytes Relative 03/02/2017 6  % Final  . Monocytes Absolute 03/02/2017 0.3  0.1 - 1.0 K/uL Final  . Eosinophils Relative 03/02/2017 1  % Final  . Eosinophils Absolute 03/02/2017 0.0  0.0 - 0.7 K/uL Final  . Basophils Relative  03/02/2017 0  % Final  . Basophils Absolute 03/02/2017 0.0  0.0 - 0.1 K/uL Final  . Sodium 03/02/2017 137  135 - 145 mmol/L Final  . Potassium 03/02/2017 4.0  3.5 - 5.1 mmol/L Final  . Chloride 03/02/2017 93* 101 - 111 mmol/L Final  . CO2 03/02/2017 32  22 - 32 mmol/L Final  . Glucose, Bld 03/02/2017 108* 65 - 99 mg/dL Final  . BUN 03/02/2017 18  6 - 20 mg/dL Final  . Creatinine, Ser 03/02/2017 0.44* 0.61 - 1.24 mg/dL Final  . Calcium 03/02/2017 9.6  8.9 - 10.3 mg/dL Final  . Total Protein 03/02/2017 7.4  6.5 - 8.1 g/dL Final  . Albumin 03/02/2017 3.3* 3.5 - 5.0 g/dL Final  . AST 03/02/2017 23  15 - 41 U/L Final  . ALT 03/02/2017 36  17 - 63 U/L Final  . Alkaline Phosphatase 03/02/2017 108  38 - 126 U/L Final  . Total Bilirubin 03/02/2017 0.3  0.3 - 1.2 mg/dL Final  . GFR calc non Af Amer 03/02/2017 >60  >60 mL/min Final  . GFR calc Af Amer 03/02/2017 >60  >60 mL/min Final   Comment: (NOTE) The eGFR has been calculated using the CKD EPI equation. This calculation has not been validated in all clinical situations. eGFR's persistently <60 mL/min signify possible Chronic Kidney Disease.   . Anion gap 03/02/2017 12  5 - 15 Final  . Sodium 03/02/2017 136  135 - 145 mmol/L Final  . Potassium 03/02/2017 4.0  3.5 - 5.1 mmol/L Final  . Chloride 03/02/2017 93* 101 - 111 mmol/L Final  . BUN 03/02/2017 16  6 - 20 mg/dL Final  . Creatinine, Ser 03/02/2017 0.60* 0.61 - 1.24 mg/dL Final  . Glucose, Bld 03/02/2017 104* 65 - 99 mg/dL Final  . Calcium, Ion 03/02/2017 1.16  1.15 - 1.40 mmol/L Final  . TCO2 03/02/2017 35* 22 - 32 mmol/L Final  . Hemoglobin 03/02/2017 10.9* 13.0 - 17.0 g/dL Final  . HCT 03/02/2017 32.0* 39.0 - 52.0 % Final       Ardath Sax, MD

## 2017-03-10 NOTE — Assessment & Plan Note (Signed)
72 y.o. 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 No evidence of distal metastatic disease at this time based on the initial PET/CT. Patient was not a candidate for cisplatin-based chemotherapy due to pre-existing hearing loss requiring hearing aids.  Patient has completed chemoradiation therapy and is now in recovery phase which has been progressing with significant residual symptoms.    At the present time, the symptoms are dominated by recurrent swelling in the neck, now extending into the oral cavity including the swelling of the tongue making speech and swallowing difficult.  At this time, no evident airway compromise, no stridor, and no expiratory wheezing on lung auscultation.  Not entirely sure if this is just progressive edema or patient has developed angioedema as an allergic reaction to 1 of his medications or environmental factor.  Appears to be responsive to steroid therapy given to him during the emergency room visit several days prior.  Swelling recurred shortly after discontinuation of steroids.  Plan: --Continue Lorazepam to 1mg  PO Q6hrs PRN --Discontinue acetylcysteine, cyclobenzaprine, ondansetron, Compazine.  These medications are no longer necessary and we need to simplify our drug management. -Maalox as needed for burning in the throat especially before laying down to bed. -Methylprednisolone 125 mg IM plus diphenhydramine 25 mg IM x1 now -Patient will start prednisone 40 mg daily times 7 days followed by slower taper tomorrow due to initiation of high-dose steroids,; will start patient on omeprazole 20 mg twice a day for stress ulcer prophylaxis. -Patient will take diphenhydramine 25 mg at nighttime for dual purpose of augmentation of anti-swelling therapy as well as sleep  assistance --Restaging PET/CT currently scheduled for 03/10/17 --Return to my clinic on 03/11/17: Review of PET/CT, continued symptom management.

## 2017-03-11 ENCOUNTER — Ambulatory Visit
Admission: RE | Admit: 2017-03-11 | Discharge: 2017-03-11 | Disposition: A | Discharge status: Home / Self Care | Payer: Medicare Other | Source: Ambulatory Visit | Attending: Radiation Oncology | Admitting: Radiation Oncology

## 2017-03-11 ENCOUNTER — Encounter: Payer: Self-pay | Admitting: Radiation Oncology

## 2017-03-11 ENCOUNTER — Encounter: Payer: Self-pay | Admitting: *Deleted

## 2017-03-11 ENCOUNTER — Inpatient Hospital Stay: Payer: Medicare Other | Attending: Hematology and Oncology | Admitting: Hematology and Oncology

## 2017-03-11 ENCOUNTER — Encounter: Payer: Self-pay | Admitting: Hematology and Oncology

## 2017-03-11 VITALS — BP 152/101 | HR 92 | Temp 98.0°F | Ht 69.0 in | Wt 158.0 lb

## 2017-03-11 DIAGNOSIS — Z7952 Long term (current) use of systemic steroids: Secondary | ICD-10-CM | POA: Diagnosis not present

## 2017-03-11 DIAGNOSIS — R1310 Dysphagia, unspecified: Secondary | ICD-10-CM | POA: Diagnosis not present

## 2017-03-11 DIAGNOSIS — Z79899 Other long term (current) drug therapy: Secondary | ICD-10-CM | POA: Insufficient documentation

## 2017-03-11 DIAGNOSIS — H9190 Unspecified hearing loss, unspecified ear: Secondary | ICD-10-CM | POA: Diagnosis not present

## 2017-03-11 DIAGNOSIS — E785 Hyperlipidemia, unspecified: Secondary | ICD-10-CM | POA: Diagnosis not present

## 2017-03-11 DIAGNOSIS — R221 Localized swelling, mass and lump, neck: Secondary | ICD-10-CM

## 2017-03-11 DIAGNOSIS — I2510 Atherosclerotic heart disease of native coronary artery without angina pectoris: Secondary | ICD-10-CM | POA: Insufficient documentation

## 2017-03-11 DIAGNOSIS — C01 Malignant neoplasm of base of tongue: Secondary | ICD-10-CM

## 2017-03-11 DIAGNOSIS — C770 Secondary and unspecified malignant neoplasm of lymph nodes of head, face and neck: Secondary | ICD-10-CM | POA: Insufficient documentation

## 2017-03-11 DIAGNOSIS — Z1329 Encounter for screening for other suspected endocrine disorder: Secondary | ICD-10-CM

## 2017-03-11 DIAGNOSIS — R220 Localized swelling, mass and lump, head: Secondary | ICD-10-CM | POA: Diagnosis not present

## 2017-03-11 DIAGNOSIS — Z87891 Personal history of nicotine dependence: Secondary | ICD-10-CM | POA: Insufficient documentation

## 2017-03-11 DIAGNOSIS — Z9221 Personal history of antineoplastic chemotherapy: Secondary | ICD-10-CM

## 2017-03-11 DIAGNOSIS — Z923 Personal history of irradiation: Secondary | ICD-10-CM | POA: Diagnosis not present

## 2017-03-11 DIAGNOSIS — I700 Atherosclerosis of aorta: Secondary | ICD-10-CM | POA: Diagnosis not present

## 2017-03-11 DIAGNOSIS — Z79891 Long term (current) use of opiate analgesic: Secondary | ICD-10-CM | POA: Diagnosis not present

## 2017-03-11 DIAGNOSIS — I890 Lymphedema, not elsewhere classified: Secondary | ICD-10-CM | POA: Insufficient documentation

## 2017-03-11 DIAGNOSIS — I10 Essential (primary) hypertension: Secondary | ICD-10-CM | POA: Insufficient documentation

## 2017-03-11 DIAGNOSIS — Z931 Gastrostomy status: Secondary | ICD-10-CM | POA: Diagnosis not present

## 2017-03-11 LAB — TSH: TSH: 0.428 u[IU]/mL (ref 0.320–4.118)

## 2017-03-11 MED ORDER — GABAPENTIN 100 MG PO CAPS: 100.0000 mg | ORAL_CAPSULE | Freq: Three times a day (TID) | ORAL | 0 refills | Status: DC

## 2017-03-11 MED ORDER — PREDNISONE 20 MG PO TABS: 20.0000 mg | ORAL_TABLET | Freq: Every day | ORAL | 0 refills | Status: DC

## 2017-03-11 NOTE — Progress Notes (Addendum)
Radiation Oncology         [phone] ________________________________  Name: Joel Walton MRN: [id number]  Date: 03/11/2017  DOB: 07/25/1945  Follow-Up Visit Note  CC: Lorene Dy, MD  Rozetta Nunnery, *  Diagnosis and Prior Radiotherapy:       ICD-10-CM   1. 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 He is accompanied by his daughter today. His daughter reports that the lymphedema has gradually decreased with the lymphedema massage.   Since his last visit to the office, he underwent a PET scan on 03/10/17 with results showing: IMPRESSION: Decrease in hypermetabolism involving the tongue and floor of mouth. This does extend more anteriorly today. Although the portion of this could be related to radiation change, residual disease is suspected.   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 No evidence of hypermetabolic extra cervical disease. Coronary artery atherosclerosis. Aortic Atherosclerosis.   On review of systems, he reports tongue swelling x 2-3 weeks and burning sensation to his anterior neck. He has been evaluated in the ED and his Medical Oncologist where he was placed on steroids. He has been on two rounds of steroids with his most recent being 03/08/2017. He denies tongue pain and any other symptoms.   Pain issues, if any: He reports pain with swallowing. He is only swallowing liquids.   Using a feeding tube?: Yes, he is instilling 6 cans of osmolite  daily. He continues to instill free water daily.    Weight changes, if any: He was not able to instill osmolite last week because of thick saliva due to nausea. His osmolite intake has improved this week. Wt Readings from Last 3 Encounters:  03/11/17 158 lb 4.8 oz (71.8 kg)  03/11/17 158 lb (71.7 kg)  03/02/17 160 lb (72.6 kg)   Swallowing issues, if any: He reports pain with swallowing. He is doing his swallowing exercises. He has tried ice cream but continues to have pain requiring morphine.  Smoking or chewing tobacco? No.  Last ENT visit was on: Not since diagnosis  Other notable issues, if any: He reports continued taste changes. 12/07/2016 Dr. Lebron Conners with labs. His next appointment is 12/31/2016. Garald Balding ST 12/13/2016.                ALLERGIES:  has No Known Allergies.  Meds: Current Outpatient Medications  Medication Sig Dispense Refill  . alum & mag hydroxide-simeth (MAALOX ADVANCED MAX ST) 400-400-40 MG/5ML suspension Take 30 mLs by mouth every 6 (six) hours as needed for indigestion. 355 mL 0  . diphenhydrAMINE (BENYLIN) 12.5 MG/5ML syrup Take 10 mLs (25 mg total) by mouth at bedtime as needed for allergies or sleep. 120 mL 0  . lidocaine-prilocaine (EMLA) cream Apply to affected area once 30 g 3  . Morphine Sulfate (MORPHINE CONCENTRATE) 10 mg / 0.5 ml concentrated solution Take 1 mL (20 mg total) by mouth every 4 (four) hours as needed for severe pain. 240 mL  0  . Nutritional Supplements (FEEDING SUPPLEMENT, OSMOLITE 1.5 CAL,) LIQD Give 1.5 bottles Osmolite 1.5 QID with 60 mL free water before and after via PEG. Increase to 2 bottles once daily and continue 1.5 bottles TID as tolerated. Drink or flush PEG with additional 240 mL free water TID. Send supplies and formula. Call daughter, [name], at [phone] to arrange delivery for patient. 1541 mL 0  . omeprazole (PRILOSEC) 20 MG capsule Take 1 capsule (20 mg total) by mouth 2 (two) times daily before a meal. 60 capsule 0   . gabapentin (NEURONTIN) 100 MG capsule Take 1 capsule (100 mg total) by mouth 3 (three) times daily. 90 capsule 0  . LORazepam (LORAZEPAM INTENSOL) 2 MG/ML concentrated solution Take 0.5 mLs (1 mg total) by mouth every 4 (four) hours as needed for anxiety (nausea). (Patient not taking: Reported on 03/11/2017) 30 mL 0  . [START ON 03/16/2017] predniSONE (DELTASONE) 20 MG tablet Take 1 tablet (20 mg total) by mouth daily with breakfast for 7 days. 7 tablet 0   No current facility-administered medications for this encounter.    Review of Systems: as above  Physical Findings: Wt Readings from Last 3 Encounters:  03/11/17 158 lb 4.8 oz (71.8 kg)  03/11/17 158 lb (71.7 kg)  03/02/17 160 lb (72.6 kg)    height is 5\' 9"  (1.753 m) and weight is 158 lb (71.7 kg). His temperature is 98 F (36.7 C). His blood pressure is 152/101 (abnormal) and his pulse is 92. His oxygen saturation is 98%. .  General: Alert and oriented, in no acute distress. HEENT: Head is normocephalic. Dentures removed for exam. Oropharynx is notable for mucous membranes are moist. Very significant lymphedema in the floor of his mouth and tongue. The floor of his mouth is extremely swollen. The tissues are soft, I don't feel any nodularity in the tongue or the floor of mouth. Mucositis has resolved. Neck: Neck is notable for no palpable masses in the cervical or supraclavicular regions. No palpable lymph node masses in the cervical or supraclavicular neck, but he has significant lymphedema in the anterior central neck. Heart: Regular in rate and rhythm with no murmurs, rubs, or gallops. Chest: Clear to auscultation bilaterally, with no rhonchi, wheezes, or rales. Low-pitch sounds on expiration in the bases, otherwise clear.  Skin: Skin in treatment fields shows satisfactory healing. His skin is still dry over his neck. Lymphatics: see Neck Exam Psychiatric: Judgment and insight are intact. Affect is appropriate.    Lab  Findings: Lab Results  Component Value Date   WBC 4.4 03/02/2017   HGB 10.9 (L) 03/02/2017   HCT 32.0 (L) 03/02/2017   MCV 93.4 03/02/2017   PLT 208 03/02/2017    Lab Results  Component Value Date   TSH 0.428 03/11/2017    Radiographic Findings: Ct Soft Tissue Neck W Contrast  Result Date: 03/02/2017 CLINICAL DATA:  Head neck cancer with worsening edema. EXAM: CT NECK WITH CONTRAST TECHNIQUE: Multidetector CT imaging of the neck was performed using the standard protocol following the bolus administration of intravenous contrast. CONTRAST:  47mL ISOVUE-300 IOPAMIDOL (ISOVUE-300) INJECTION 61% COMPARISON:  CT neck 09/01/2016 FINDINGS: Pharynx and larynx: --Nasopharynx: Fossae of Rosenmuller are clear. Normal adenoid tonsils for age. --Oral cavity and oropharynx: Decreased density of the mass at the base of the tongue the mass appears mostly necrotic and measures 4.5 x 3.2 x 3.4 cm. There is persistent extension to the vallecula. --Hypopharynx: The vallecula is effaced by the edematous base of  tongue soft tissues . --Larynx: No significant epiglottic thickening. The pre epiglottic fat remains effaced. False vocal cords are edematous. --Retropharyngeal space: No abscess, effusion or lymphadenopathy. Salivary glands: --Parotid: No mass lesion or inflammation. No sialolithiasis or ductal dilatation. --Submandibular: Symmetric without inflammation. No sialolithiasis or ductal dilatation. --Sublingual: Not clearly visualized. Thyroid: Normal. Lymph nodes: Previously seen enlarged right level 2A lymph node has markedly decreased in size. At this location, there is now a centrally hypodense 5 mm node (series 2, image 48). No new right-sided lymph nodes. Left level 2B node is unchanged, measuring 8 mm. A more superior left level 2B node measuring 5 mm is also unchanged. No other enlarged or abnormal density cervical lymph nodes. Vascular: Right chest wall Port-A-Cath with tip below the field of view. Major  cervical vessels are patent. Limited intracranial: Normal. Visualized orbits: Normal. Mastoids and visualized paranasal sinuses: Bilateral mastoid effusions. Mild bilateral maxillary mucosal thickening. Skeleton: Bilateral shoulder osteoarthrosis. No lytic or blastic lesions. Upper chest: Clear. Other: Subcutaneous soft tissue edema of the lower anterior neck. IMPRESSION: 1. Decreased enhancement and solid component of base of tongue mass, which has now been replaced with a primarily necrotic collection that measures 4.5 x 3.2 x 3.4 cm. This likely reflects post treatment change. 2. Markedly decreased size of metastatic right level 2A lymph node, showing central necrosis. Unchanged left level 2B nodes without abnormal density. 3. Bilateral mastoid effusions are likely secondary to radiation therapy. 4. Edema of the anterior subcutaneous tissues of the neck and at the larynx is also likely a post treatment effect. Electronically Signed   By: Ulyses Jarred M.D.   On: 03/02/2017 22:05   Nm Pet Image Restag (ps) Skull Base To Thigh  Result Date: 03/10/2017 CLINICAL DATA:  Subsequent treatment strategy for cancer of base of tongue. EXAM: NUCLEAR MEDICINE PET SKULL BASE TO THIGH TECHNIQUE: 8.2 mCi F-18 FDG was injected intravenously. Full-ring PET imaging was performed from the skull base to thigh after the radiotracer. CT data was obtained and used for attenuation correction and anatomic localization. FASTING BLOOD GLUCOSE:  Value: 91 mg/dl COMPARISON:  PET of 09/07/2016.  Neck CT 03/02/2017 FINDINGS: NECK: Hypermetabolism which involves the tongue and floor of mouth. This now extends more anteriorly than on the prior. Measures a S.U.V. max of 12.1, with relative photopenia centrally. On the prior, this measured a S.U.V. max of 20.2. The right-sided hypermetabolic nodes have resolved. There is a left-sided level 2 node which measures 7 mm a S.U.V. max of 3.3 today versus 7 mm and a S.U.V. max of 3.2 on the prior  exam. No new cervical adenopathy. Bilateral mastoid effusions. CHEST: No pulmonary parenchymal or thoracic nodal hypermetabolism mild cardiomegaly. Coronary artery atherosclerosis. Upper normal ascending aortic size, 4.0 cm. Aortic atherosclerosis. ABDOMEN/PELVIS: No abdominopelvic parenchymal or nodal hypermetabolism. Punctate upper pole right renal collecting system calculus. Normal adrenal glands. Gastrostomy tube. Apparent bladder wall thickening is favored to be due to underdistention. SKELETON: No abnormal marrow activity. No focal osseous lesion. Fusion of the bilateral sacroiliac joints could be degenerative. Bilateral glenohumeral joint osteoarthritis. IMPRESSION: 1. Decrease in hypermetabolism involving the tongue and floor of mouth. This does extend more anteriorly today. Although the portion of this could be related to radiation change, residual disease is suspected. Is 2. 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 3. No evidence of hypermetabolic extra cervical disease. 4. Coronary artery atherosclerosis. Aortic Atherosclerosis (ICD10-I70.0). Electronically Signed   By: Abigail Miyamoto M.D.   On: 03/10/2017 14:00  Impression/Plan:    1) Head and Neck Cancer Status: healing well from ChRT but see PET results which I favor to be from lymphedema and not tumor  2) Nutritional Status: He is regaining his sense of smell and eating soft foods. PEG Tube: Instilling 6 cans of osmolite daily and free water daily.   3) Risk Factors: The patient has been educated about risk factors including alcohol and tobacco abuse; they understand that avoidance of alcohol and tobacco is important to prevent recurrences as well as other cancers   4) Swallowing: He was advised to continue eating softer foods.   5) Thyroid function:WNL;  recheck in 1 year via PCP; pt knows to ask for this. Lab Results  Component Value Date   TSH 0.428 03/11/2017    7) Other: Advised to apply Vitamin E cream to the skin  twice per day over the next few months. Continue routine follow-up with Dr. Lebron Conners and Dr. Lucia Gaskins.  -Advised the patient and his daughter to continue taking prednisone as prescribed by Dr. Lebron Conners, 40 mg once daily - taper up or down per med/onc.   -Gayleen Orem, RN, our Head and Neck Oncology Navigator will request that Dr. Lucia Gaskins performed a laryngoscopy soon to rule out ulcers in throat as cause of pain and perform a thorough exam of oral cavity.  -Will make another order for lymphedema treatments. I think that it is medically neccessary that the patient continue massage therapy for his anterior neck lymphedema often.   -Advised the patient and his daughter to call Helene Kelp PT to determine if the patient will need to wear his collar all day, daily.   -Will discuss the patient's case at the multidisciplinary tumor board next week. Gayleen Orem, RN, our Head and Neck Oncology Navigator will call the patient regarding follow up information follow the tumor board next week.  Consider MRI if PET findings are not classic for lymphedema.  -Advised that the patient follow up with his PCP annually or as scheduled and have a TSH level obtained yearly.   8) Follow-up PRN between ENT and med/onc. The patient was encouraged to call with any issues or questions before then.  I spent 20 minutes minutes face to face with the patient and more than 50% of that time was spent in counseling and/or coordination of care.  ADDENDUM: upon review of previous CT Neck images at tumor board on 2-6, it was determined that an abscess/necrotic mass could be explanation for severe edema and PET findings.  Patient will be navigated to [facility] ENT for management.   _____________________________________   Eppie Gibson, MD  This document serves as a record of services personally performed by Eppie Gibson, MD. It was created on her behalf by [facility], a trained medical scribe. The creation of this record is based on the  scribe's personal observations and the provider's statements to them. This document has been checked and approved by the attending provider.

## 2017-03-14 ENCOUNTER — Telehealth: Payer: Self-pay | Admitting: *Deleted

## 2017-03-14 NOTE — Telephone Encounter (Signed)
Called patient to inform of PT appt. for 03-17-17 - arrival time - 1:35 pm @ [facility], spoke with patient's wife- Nunzio Cory and she is aware of this appt.

## 2017-03-14 NOTE — Telephone Encounter (Signed)
Oncology Nurse Navigator Documentation  Rec'd call from pt's dtr expressing concern he will be late for 3:30 SLP appt on [address] d/t 2:45 [place] appt.  I called [facility] to inform pt may be running late, spoke with [place] who noted message.  Gayleen Orem, RN, BSN Head & Neck Oncology Nurse [place] at [place] [phone]

## 2017-03-14 NOTE — Telephone Encounter (Signed)
Oncology Nurse Navigator Documentation  In follow-up to pt's appt with Dr. Isidore Moos last Friday, called ENT Dr. Pollie Friar office, spoke with Marlowe Kays, requested pt be seen for routine post-tmt follow-up including laryngoscopy Dr. Pollie Friar next available.  She voiced understanding.  Gayleen Orem, RN, BSN Head & Neck Oncology Nurse [place] at [place] [phone]

## 2017-03-15 ENCOUNTER — Inpatient Hospital Stay: Payer: Medicare Other | Admitting: Medical

## 2017-03-15 ENCOUNTER — Ambulatory Visit: Payer: Medicare Other | Attending: Radiation Oncology

## 2017-03-15 VITALS — BP 141/86 | HR 84 | Temp 98.2°F | Resp 18 | Ht 69.0 in | Wt 160.3 lb

## 2017-03-15 DIAGNOSIS — R1310 Dysphagia, unspecified: Secondary | ICD-10-CM | POA: Diagnosis present

## 2017-03-15 DIAGNOSIS — I890 Lymphedema, not elsewhere classified: Secondary | ICD-10-CM | POA: Insufficient documentation

## 2017-03-15 DIAGNOSIS — R293 Abnormal posture: Secondary | ICD-10-CM | POA: Insufficient documentation

## 2017-03-15 DIAGNOSIS — M6281 Muscle weakness (generalized): Secondary | ICD-10-CM | POA: Insufficient documentation

## 2017-03-15 DIAGNOSIS — C01 Malignant neoplasm of base of tongue: Secondary | ICD-10-CM

## 2017-03-15 NOTE — Therapy (Signed)
[facility] [address] [place], [place], [address] Phone: [phone]   Fax:  [phone]  Speech Language Pathology Treatment  Patient Details  Name: Joel Walton MRN: [id number] Date of Birth: Apr 07, 1945 Referring Provider: Eppie Gibson, MD   Encounter Date: 03/15/2017  End of Session - 03/15/17 1705    Visit Number  5    Number of Visits  6    Date for SLP Re-Evaluation  05/27/17    Authorization Type  [employer] M[employer]     Authorization Time Period  10-05-16 to 04-06-17    Authorization - Visit Number  5    Authorization - Number of Visits  6    SLP Start Time  5809    SLP Stop Time   1605    SLP Time Calculation (min)  31 min    Activity Tolerance  Patient tolerated treatment well       Past Medical History:  Diagnosis Date  . Cancer ([place])    Tongue  . History of radiation therapy 09/28/16- 11/19/16   head and neck, base of tongue 70 Gy total in 35 fractions  . Hyperlipidemia   . Hypertension     Past Surgical History:  Procedure Laterality Date  . ELBOW SURGERY Right   . IR FLUORO GUIDE PORT INSERTION RIGHT  09/24/2016  . IR GASTROSTOMY TUBE MOD SED  09/24/2016  . IR US GUIDE VASC ACCESS RIGHT  09/24/2016  . KNEE SURGERY Bilateral    Not total knee replacements  . SHOULDER ARTHROSCOPY Right     There were no vitals filed for this visit.  Subjective Assessment - 03/15/17 1539    Subjective  Pt reports doing exercises has been complicated by lymphedema inhibiting his swallow.     Currently in Pain?  Yes    Pain Score  3     Pain Location  Throat    Pain Orientation  Left    Pain Descriptors / Indicators  Burning    Pain Type  Chronic pain    Pain Onset  More than a month ago    Pain Frequency  Constant    Aggravating Factors   nothing    Pain Relieving Factors  meds sometimes            ADULT SLP TREATMENT - 03/15/17 1542      General Information   Behavior/Cognition   Alert;Cooperative;Pleasant mood      Treatment Provided   Treatment provided  Dysphagia      Dysphagia Treatment   Temperature Spikes Noted  No    Oral Cavity - Dentition  Dentures, bottom;Dentures, top    Patient observed directly with PO's  Yes    Oral Phase Signs & Symptoms  -- none noted    Pharyngeal Phase Signs & Symptoms  -- none noted    Other treatment/comments  Pt reports POs are more difficult due to what pt refers to as "tongue swelled up in the back" and burning in the posterior oral cavity. However pt with pureed and thin without any difficulty noted today. Reports taking pills with applesauce well. SPL encouraged pt to eat at least one meal/day pureed/dys level II (minced) food and thin liquid. Pt endentulous due to ill-fitting dentures (per pt). Pt performed HEP with modified independence today. SLP educated pt re: rationale for HEP, pt return demonstrated 5 minutes later. SLP also educated re: food journal and pt return demonstrated benefits with modified independence.  Assessment / Recommendations / Plan   Plan  Continue with current plan of care see pt in two months due to progress      Progression Toward Goals   Progression toward goals  Progressing toward goals       SLP Education - 03/15/17 1704    Education provided  Yes    Education Details  food journal, HEP rationale, need to incr POs (suggested dys I or II diet with thin liquids)    Person(s) Educated  Patient    Methods  Explanation;Handout    Comprehension  Verbalized understanding;Returned demonstration;Need further instruction       SLP Short Term Goals - 01/26/17 1632      SLP SHORT TERM GOAL #1   Title  pt will complete HEP with rare min A over two sessions    Status  Achieved      SLP SHORT TERM GOAL #2   Title  pt will tell SLP why he is completing HEP over two sessions    Baseline  01-26-17    Status  Partially Met       SLP Long Term Goals - 03/15/17 1709      SLP LONG TERM GOAL  #1   Title  pt will tell SLP 3 overt s/s aspiration PNA with modified independence    Status  Achieved      SLP LONG TERM GOAL #2   Title  pt will complete HEP with modified independence over two sessions     Baseline  03-15-16    Time  1    Period  -- vistis (next session in april 2019)    Status  Revised      SLP LONG TERM GOAL #3   Title  pt will tell SLP why a food journal is helpful in returning to most liberal diet    Status  Achieved       Plan - 03/15/17 1706    Clinical Impression Statement  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 Pt was modified independent with HEP today. See "skilled intervention" for more details. The probability of further swallowing difficulty remains high after chemo and radiation therapy. Pt will need to be followed by SLP for approx one more session to ensure accurate HEP completion as well as for safety with POs. Likely d/c after next session in two months.    Speech Therapy Frequency  -- once every other month (next appt in April 2019)    Duration  -- 1-2 additional visits    Treatment/Interventions  Aspiration precaution training;Pharyngeal strengthening exercises;Diet toleration management by SLP;Compensatory techniques;Internal/external aids;SLP instruction and feedback;Patient/family education;Oral motor exercises;Trials of upgraded texture/liquids;Environmental controls any or any combination may be used    Potential to Achieve Goals  Good    Consulted and Agree with Plan of Care  Patient       Patient will benefit from skilled therapeutic intervention in order to improve the following deficits and impairments:   Dysphagia, unspecified type    Problem List Patient Active Problem List   Diagnosis Date Noted  . Lymphedema of face 03/10/2017  . Elevated liver enzymes 10/07/2016  . 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 Other constipation 09/29/2016  . Mild protein malnutrition ([place])  09/29/2016  . Dysphagia 09/29/2016  . 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 Cancer of base of tongue ([place]) 09/14/2016    [place] ,[place], [place]  03/15/2017,  5:10 PM  [facility] [address] [place], [place], [address] Phone: [phone]   Fax:  [phone]   Name: MARTINE TRAGESER MRN: [id number] Date of Birth: [birth year]

## 2017-03-15 NOTE — Patient Instructions (Signed)
   Try pureed foods every day, once a day at least. You can puree anything - use chicken broth or vegetable broth. You may need to use a sip of water in between bites.  You could also try some spaghetti - with alfredo sauce (not tomato sauce) - you may need to use a sip of water between bites.  People will sometimes write down some of the things they have tried to eat in a chart, like below:  DATE  FOOD    How  Much   How easy to eat? (1-10) 03-15-17  Puree chicken   1/2 cup   7 03-15-17  Cream of broccoli soup 3/4 cup   10!! 02-16-96  Pizza    1/2 slice   3

## 2017-03-15 NOTE — Progress Notes (Signed)
Oncology Nurse Navigator Documentation  To provide support, encouragement and care continuity, met with Mr. Limones during f/u appt with Dr. Isidore Moos which included discussion of 1/31 PET results.  He was accompanied by his dtr. The voiced understanding of PET results, Dr. Pearlie Oyster assurance tongue and neck swelling is d/t lymphedema for which he is receiving therapy. They voiced understanding I will facilitate f/u with ENT Dr. Lucia Gaskins his next available to include laryngoscopy. I encouraged them to call me with needs/concerns.  Gayleen Orem, RN, BSN Head & Neck Oncology Nurse [place] at [place] [phone]

## 2017-03-16 ENCOUNTER — Other Ambulatory Visit: Payer: Self-pay | Admitting: Hematology and Oncology

## 2017-03-16 MED ORDER — PREDNISONE 20 MG PO TABS: 20.0000 mg | ORAL_TABLET | Freq: Every day | ORAL | 0 refills | Status: AC

## 2017-03-16 MED ORDER — DIPHENHYDRAMINE HCL 12.5 MG/5ML PO SYRP: 25.0000 mg | ORAL_SOLUTION | Freq: Every evening | ORAL | 0 refills | Status: DC | PRN

## 2017-03-17 ENCOUNTER — Encounter: Payer: Self-pay | Admitting: Physical Therapy

## 2017-03-17 ENCOUNTER — Inpatient Hospital Stay (HOSPITAL_BASED_OUTPATIENT_CLINIC_OR_DEPARTMENT_OTHER): Payer: Medicare Other | Admitting: Hematology and Oncology

## 2017-03-17 ENCOUNTER — Ambulatory Visit: Payer: Medicare Other | Admitting: Physical Therapy

## 2017-03-17 ENCOUNTER — Telehealth: Payer: Self-pay | Admitting: Hematology and Oncology

## 2017-03-17 ENCOUNTER — Encounter: Payer: Self-pay | Admitting: *Deleted

## 2017-03-17 ENCOUNTER — Other Ambulatory Visit: Payer: Self-pay

## 2017-03-17 ENCOUNTER — Encounter: Payer: Self-pay | Admitting: Hematology and Oncology

## 2017-03-17 DIAGNOSIS — M6281 Muscle weakness (generalized): Secondary | ICD-10-CM

## 2017-03-17 DIAGNOSIS — Z9221 Personal history of antineoplastic chemotherapy: Secondary | ICD-10-CM

## 2017-03-17 DIAGNOSIS — I890 Lymphedema, not elsewhere classified: Secondary | ICD-10-CM

## 2017-03-17 DIAGNOSIS — Z923 Personal history of irradiation: Secondary | ICD-10-CM

## 2017-03-17 DIAGNOSIS — R1310 Dysphagia, unspecified: Secondary | ICD-10-CM | POA: Diagnosis not present

## 2017-03-17 DIAGNOSIS — R221 Localized swelling, mass and lump, neck: Secondary | ICD-10-CM

## 2017-03-17 DIAGNOSIS — C01 Malignant neoplasm of base of tongue: Secondary | ICD-10-CM | POA: Diagnosis not present

## 2017-03-17 DIAGNOSIS — Z931 Gastrostomy status: Secondary | ICD-10-CM

## 2017-03-17 DIAGNOSIS — Z79899 Other long term (current) drug therapy: Secondary | ICD-10-CM

## 2017-03-17 DIAGNOSIS — R293 Abnormal posture: Secondary | ICD-10-CM

## 2017-03-17 MED ORDER — LORAZEPAM 0.5 MG PO TABS: 0.5000 mg | ORAL_TABLET | Freq: Three times a day (TID) | ORAL | 0 refills | Status: AC | PRN

## 2017-03-17 MED ORDER — AMOXICILLIN-POT CLAVULANATE 875-125 MG PO TABS: 1.0000 | ORAL_TABLET | Freq: Two times a day (BID) | ORAL | 0 refills | Status: AC

## 2017-03-17 NOTE — Telephone Encounter (Signed)
Gave patient avs and calendar with appts per 2/7 los.  °

## 2017-03-17 NOTE — Progress Notes (Signed)
Oncology Nurse Navigator Documentation  Met with pt during est pt appt with Dr. Lebron Conners to provide care continuity.  He was accompanied by his wife. They voiced understanding of guidance for steroid taper, appt with Dr. Lucia Gaskins tomorrow for further discussion of recent imaging and potential referral to [facility]. I encouraged them to call me with questions/concerns.  Gayleen Orem, RN, BSN Head & Neck Oncology Nurse [place] at [place] [phone]

## 2017-03-17 NOTE — Progress Notes (Signed)
This encounter was created in error - please disregard.

## 2017-03-17 NOTE — Therapy (Addendum)
[place], [place], [address] Phone: [phone]   Fax:  [phone]  Physical Therapy Treatment  Patient Details  Name: Joel Walton MRN: [id number] Date of Birth: 11-01-1945 Referring Provider: Roanna Banning    Encounter Date: 03/17/2017  PT End of Session - 03/17/17 1750    Visit Number  9    Number of Visits  17    Date for PT Re-Evaluation  04/14/17    PT Start Time  2836    PT Stop Time  1430    PT Time Calculation (min)  45 min    Activity Tolerance  Patient tolerated treatment well    Behavior During Therapy  [facility] for tasks assessed/performed       Past Medical History:  Diagnosis Date  . Cancer ([place])    Tongue  . History of radiation therapy 09/28/16- 11/19/16   head and neck, base of tongue 70 Gy total in 35 fractions  . Hyperlipidemia   . Hypertension     Past Surgical History:  Procedure Laterality Date  . ELBOW SURGERY Right   . IR FLUORO GUIDE PORT INSERTION RIGHT  09/24/2016  . IR GASTROSTOMY TUBE MOD SED  09/24/2016  . IR US GUIDE VASC ACCESS RIGHT  09/24/2016  . KNEE SURGERY Bilateral    Not total knee replacements  . SHOULDER ARTHROSCOPY Right     There were no vitals filed for this visit.  Subjective Assessment - 03/17/17 1359    Subjective  Pt brings in a Jovi Pak head and neck garment that appears to fit fine.  He is going to see Dr. Lucia Gaskins tomorrow to see about doing a procedure to help with the inflammation at the base of his tongue.  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  Former smoker. Edentulous, full dentures. Bilateral hearing loss. h/o right shoulder rotator cuff surgery and has pins in right elbow. Begain XRT 09/28/16 and is to have 35 fractions; chemo started 10/01/16. PEG/PAC  placed 09/24/16.    Patient Stated Goals  to get rid of the fluid in his neck and to build his energy up     Currently in Pain?  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 neck adenopathy    Referring Provider  Sr. Eppie Gibson       Prior Function   Level of Independence  Independent                  [facility] Adult PT Treatment/Exercise - 03/17/17 0001      Manual Therapy   Manual Therapy  Edema management    Manual therapy comments  reviewed Jovi pack neck garment on pt and it appears to fit fine.  Pt says it is comfortable  Soft tissue mobilization  in sitting, with Biotone, soft tissue work with some prolonged pressure on tender points in upper traps, neck and posterior axilla  done after MLD session.  Pt with tightness in posterior cervial and upper trap muscles     Manual Lymphatic Drainage (MLD)  Pt seated in chair for his comfort: Short neck, supraclavicular fossa, bil shoulder collectors, bil axillae, anterior throat, submental and submandibular nodes, pre-a nd retroauricular nodes, bil masseters all directing towards lateral pathways and eventually axillae.                   PT Long Term Goals - 03/17/17 1402      PT LONG TERM GOAL #1   Title  Pt /family will be independent in managing lymphedema with manual lymph drainage, use of compression and exercise     Time  4    Period  Weeks    Status  On-going      PT LONG TERM GOAL #2   Title  Pt will report his pain and discomfort have decreased by 50% overall     Time  4    Period  Weeks    Status  On-going      PT LONG TERM GOAL #3   Title  Pt will be  independent in a home program for continued increase in strength and endurance     Time  4    Period  Weeks    Status  On-going         Head and Neck Clinic Goals - 10/05/16 1336      Patient will be able to verbalize understanding of a home exercise program for cervical range of motion, posture, and walking.    Status  Achieved      Patient will be able to verbalize understanding of proper sitting and standing posture.    Status  Achieved      Patient will be able to verbalize understanding of lymphedema risk and availability of treatment for this condition.    Status  Achieved        Plan - 03/17/17 1750    Clinical Impression Statement  Pt is looking forward to another dr. visit tomorrow to see if something can be done about the source of his pain  He wants to continue PT for another 4 weeks as he gets some relief with the treatment and feels that the MLD and soft tissue work is helping him Recert sent today     Clinical Impairments Affecting Rehab Potential  previous chemoradiation  still has feeding tube and port     PT Frequency  2x / week    PT Duration  4 weeks    PT Next Visit Plan   Focus on MLD: assess indep with self manual lymph drainage, neck and scapular range of motion, use of compression garment, Continue with soft tissue work to neck/upper back if pt feels that is helpful; encourage walking program; later progress to general strength exercise program and teach HEP, transition to community exercise possibly [employer] eventually     PT Home Exercise Plan  neck ROM, good posture practice, walking, neck ROM exercises, supine scapular series     Consulted and Agree with Plan of Care  Patient       Patient will benefit from skilled therapeutic intervention in order to improve the following deficits and impairments:  Increased edema, Decreased range of motion, Pain, Decreased knowledge of use of DME, Decreased knowledge of  precautions, Decreased  activity tolerance, Decreased endurance, Decreased strength, Impaired perceived functional ability, Postural dysfunction  Visit Diagnosis: Lymphedema, not elsewhere classified  Abnormal posture  Muscle weakness (generalized)     Problem List Patient Active Problem List   Diagnosis Date Noted  . Lymphedema of face 03/10/2017  . Elevated liver enzymes 10/07/2016  . 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 Other constipation 09/29/2016  . Mild protein malnutrition ([place]) 09/29/2016  . Dysphagia 09/29/2016  . 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 Cancer of base of tongue ([place]) 09/14/2016   Donato Heinz. Owens Shark, PT  Norwood Levo 03/17/2017, 5:53 PM  [place], [place], [address] Phone: [phone]   Fax:  [phone]  Name: Joel Walton MRN: [id number] Date of Birth: December 14, 1945  PHYSICAL THERAPY DISCHARGE SUMMARY  Visits from Start of Care: 9  Current functional level related to goals / functional outcomes: unknown   Remaining deficits: unknown   Education / Equipment: Manual lymph drainage, use of compression, home exercise  Plan: Patient agrees to discharge.  Patient goals were not met. Patient is being discharged due to not returning since the last visit.  ?????    Maudry Diego, PT 04/07/17 12:58 PM

## 2017-03-18 ENCOUNTER — Telehealth: Payer: Self-pay | Admitting: *Deleted

## 2017-03-18 NOTE — Telephone Encounter (Addendum)
Oncology Nurse Navigator Documentation  Rec'd call from ENT Dr. Lucia Gaskins s/p appt with pt indicating he spoke with Dr. Aida Puffer, [facility], who agreed to see pt in ED.   Physician note in [place] indicates he arrived, seen by physician 3:01.  Spoke with Dr. Lucia Gaskins to inform.  He expressed appreciation for update.  Gayleen Orem, RN, BSN Head & Neck Oncology Nurse [place] at [place] [phone]

## 2017-03-18 NOTE — Telephone Encounter (Signed)
Oncology Nurse Navigator Documentation  Returned [phone] VMM to pt dtr.   She reported/expressed concern pt had notable tongue swelling over the HS ("came out of his mouth") that resolved after taking diphenhydramine and prednisone.  She denied he experienced respiratory distress, airway blockage.   She noted correlation with yesterday's lymphedema tmt, has noted similar response after previous tmts.   She stated her dad has 1:15 appt with Dr. Lucia Gaskins today and will address.  I encouraged her to call his office to see if earlier appt available.  Gayleen Orem, RN, BSN Head & Neck Oncology Nurse [place] at [place] [phone]

## 2017-03-18 NOTE — Telephone Encounter (Signed)
Oncology Nurse Navigator Documentation  Following call to pt dtr, spoke with pt's PT lymphedema therapist Joel Walton.  She indicated literature review has not correlated nor has she had experience with lymphedema neck massage and tongue swelling.  She suggested 1) he hold off on further tmts for a week to provide further resolution of BOT infection/necrosis per follow-up with Dr. Lowry Bowl, 2) increased wearing of compression device recently received.  Called drt with the above guidance.  She voiced understanding.   Gayleen Orem, RN, BSN Head & Neck Oncology Nurse [place] at [place] [phone]

## 2017-03-18 NOTE — Telephone Encounter (Signed)
Oncology Nurse Navigator Documentation  E-mail referral sent to [facility] H&N Navigator Joel Walton:  "Good morning, Joel -   Wanted to provide a heads up on referral for Joel Walton based on Wednesday's H&N Conference:  . Joel Walton  DOB [birth year] - Completed chemo/RT 11/19/2016 for BOT p16 positive SCC, is being treated for significant profound post-RT lymphedema.  He has been experiencing significant tongue swelling, some thought initially d/t infection but no response to abx.  Swelling somewhat controlled with PRN diphenhydramine and prednisone.  Conference discussion favored BOT necrosis with fluid build up.  Pt is seeing Dr. Lucia Gaskins this afternoon for further evaluation and he expects to refer to [facility] for additional evaluation.    Please contact Dr. Pollie Friar office for his notes [phone]).  I will have imaging for these patients pushed to [employer]."  Joel Orem, Joel Walton, Joel Walton Head & Neck Oncology Nurse [place] at [place] [phone]

## 2017-03-19 ENCOUNTER — Telehealth: Payer: Self-pay | Admitting: *Deleted

## 2017-03-19 MED ORDER — OXYCODONE HCL 5 MG/5ML PO SOLN
5.00 | ORAL | Status: DC
Start: ? — End: 2017-03-19

## 2017-03-19 MED ORDER — RANITIDINE HCL 75 MG/5ML PO SYRP
150.00 | ORAL_SOLUTION | ORAL | Status: DC
Start: 2017-04-04 — End: 2017-03-19

## 2017-03-19 MED ORDER — KCL IN DEXTROSE-NACL 20-5-0.45 MEQ/L-%-% IV SOLN
INTRAVENOUS | Status: DC
Start: ? — End: 2017-03-19

## 2017-03-19 MED ORDER — ENOXAPARIN SODIUM 40 MG/0.4ML ~~LOC~~ SOLN
40.00 | SUBCUTANEOUS | Status: DC
Start: 2017-03-20 — End: 2017-03-19

## 2017-03-19 MED ORDER — SODIUM CHLORIDE 0.9 % IV SOLN
INTRAVENOUS | Status: DC
Start: ? — End: 2017-03-19

## 2017-03-19 MED ORDER — GENERIC EXTERNAL MEDICATION
3.38 g | Status: DC
Start: 2017-03-19 — End: 2017-03-19

## 2017-03-19 MED ORDER — PROPOFOL 100 MG/10ML IV EMUL
.00 | INTRAVENOUS | Status: DC
Start: ? — End: 2017-03-19

## 2017-03-19 MED ORDER — CHLORHEXIDINE GLUCONATE 0.12 % MT SOLN
15.00 | OROMUCOSAL | Status: DC
Start: 2017-04-04 — End: 2017-03-19

## 2017-03-19 MED ORDER — CHLORHEXIDINE GLUCONATE 0.12 % MT SOLN
15.00 | OROMUCOSAL | Status: DC
Start: ? — End: 2017-03-19

## 2017-03-19 MED ORDER — VANCOMYCIN HCL IN DEXTROSE 1-5 GM/200ML-% IV SOLN
1.00 g | INTRAVENOUS | Status: DC
Start: 2017-03-19 — End: 2017-03-19

## 2017-03-19 MED ORDER — DEXAMETHASONE SODIUM PHOSPHATE 4 MG/ML IJ SOLN
10.00 | INTRAMUSCULAR | Status: DC
Start: 2017-03-19 — End: 2017-03-19

## 2017-03-19 NOTE — Telephone Encounter (Signed)
Oncology Nurse Navigator Documentation  Spoke with pt's dtr to check on his well-being.  She reported he is resting comfortably s/p his tongue mass bx conducted last HS.  I answered her questions to the best of my ability re tmt options should tissue indicate cancer vs necrosis.  She noted he has been started on a broad spectrum abx pending bx results.  She voiced understanding I will continue to monitor his progress,expressed appreciation for my call.  Gayleen Orem, RN, BSN Head & Neck Oncology Nurse [place] at [place] [phone]

## 2017-03-22 MED ORDER — LACTATED RINGERS IV SOLN
INTRAVENOUS | Status: DC
Start: ? — End: 2017-03-22

## 2017-03-22 MED ORDER — INSULIN LISPRO 100 UNIT/ML ~~LOC~~ SOLN
2.00 | SUBCUTANEOUS | Status: DC
Start: 2017-03-23 — End: 2017-03-22

## 2017-03-22 MED ORDER — LORAZEPAM 2 MG/ML PO CONC
.50 | ORAL | Status: DC
Start: ? — End: 2017-03-22

## 2017-03-22 MED ORDER — GENERIC EXTERNAL MEDICATION
Status: DC
Start: 2017-03-23 — End: 2017-03-22

## 2017-03-22 MED ORDER — POLYETHYLENE GLYCOL 3350 17 G PO PACK
17.00 g | PACK | ORAL | Status: DC
Start: 2017-03-24 — End: 2017-03-22

## 2017-03-22 MED ORDER — GABAPENTIN 100 MG PO CAPS
100.00 | ORAL_CAPSULE | ORAL | Status: DC
Start: 2017-03-23 — End: 2017-03-22

## 2017-03-22 MED ORDER — MORPHINE SULFATE 10 MG/5ML PO SOLN
20.00 | ORAL | Status: DC
Start: ? — End: 2017-03-22

## 2017-03-22 MED ORDER — DEXAMETHASONE SODIUM PHOSPHATE 4 MG/ML IJ SOLN
10.00 | INTRAMUSCULAR | Status: DC
Start: 2017-03-23 — End: 2017-03-22

## 2017-03-22 MED ORDER — ACETAMINOPHEN 650 MG/20.3ML PO SOLN
650.00 | ORAL | Status: DC
Start: ? — End: 2017-03-22

## 2017-03-22 MED ORDER — ENOXAPARIN SODIUM 40 MG/0.4ML ~~LOC~~ SOLN
40.00 | SUBCUTANEOUS | Status: DC
Start: 2017-04-05 — End: 2017-03-22

## 2017-03-22 MED ORDER — BUTALBITAL-APAP-CAFFEINE 50-325-40 MG PO TABS
1.00 | ORAL_TABLET | ORAL | Status: DC
Start: ? — End: 2017-03-22

## 2017-03-22 MED ORDER — DEXTROSE 10 % IV SOLN
125.00 | INTRAVENOUS | Status: DC
Start: ? — End: 2017-03-22

## 2017-03-22 NOTE — Addendum Note (Signed)
Encounter addended by: Eppie Gibson, MD on: 03/22/2017 4:04 PM  Actions taken: Sign clinical note

## 2017-03-23 MED ORDER — KETOROLAC TROMETHAMINE 15 MG/ML IJ SOLN
15.00 | INTRAMUSCULAR | Status: DC
Start: ? — End: 2017-03-23

## 2017-03-23 MED ORDER — ALUMINUM-MAGNESIUM-SIMETHICONE 200-200-20 MG/5ML PO SUSP
30.00 | ORAL | Status: DC
Start: ? — End: 2017-03-23

## 2017-03-24 ENCOUNTER — Ambulatory Visit: Payer: Self-pay | Admitting: Hematology and Oncology

## 2017-03-31 NOTE — Progress Notes (Signed)
[place] [facility] Follow-up Visit:  Assessment: Cancer of base of tongue ([employer]) 72 y.o. 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 No evidence of distal metastatic disease at this time based on the initial PET/CT. Patient was not a candidate for cisplatin-based chemotherapy due to pre-existing hearing loss requiring hearing aids.  Patient has completed chemoradiation therapy and is now in recovery phase which has been progressing with significant residual symptoms.    At the present time, the symptoms are dominated by recurrent swelling in the neck, now extending into the oral cavity including the swelling of the tongue making speech and swallowing difficult.  At this time, no evident airway compromise, no stridor, and no expiratory wheezing on lung auscultation.  Not entirely sure if this is just progressive edema or patient has developed angioedema as an allergic reaction to 1 of his medications or environmental factor.  Presence of infection or recurrent malignancy cannot be fully excluded.  Patient is afebrile and there is no purulent drainage so infection is lower on my differential.   Plan: --Continue Lorazepam to 1mg  PO Q6hrs PRN -Discontinue Maalox -Continue prednisone 20 mg daily for additional 7 days.  Continue diphenhydramine 25 mg nightly. - Start Neurontin 100 mg 3 times a day. -Return to clinic in 1 week.  Voice recognition software was used and creation of this note. Despite my best effort at editing the text, some misspelling/errors may have occurred.  No orders of the defined types were placed in this encounter.   Cancer Staging Cancer of base of tongue ([place]) Staging form: Pharynx - HPV-Mediated Oropharynx, AJCC 8th Edition - Clinical: Stage II (cT2,  cN2, cM0, p16: Positive) - Signed by Eppie Gibson, MD on 09/14/2016   All questions were answered.  . The patient knows to call the clinic with any problems, questions or concerns.  This note was electronically signed.    History of Presenting Illness Bell Cai Hefferan is a 72 y.o. male followed in the [place] for diagnosis of carcinoma of the right base of the tongue metastatic to cervical lymph nodes. Patient is currently undergoing curative-intent treatment with chemoradiotherapy. He presents to the clinic for possible 4th week of carboplatin and paclitaxel therapy administered concurrently with radiation.  Patient returns to the clinic for continued symptom management.  Continues to complain of headaches and swelling in the mouth slightly improved on the current steroid medication.  No significant improvement in the burning sensation in the mouth from [place].  Tongue remains significantly swollen.  In the speech, but no significant shortness of breath at this time.  Oncological/hematological History:   Cancer of base of tongue ([place])   08/26/2016 Pathology Results    Tongue biopsy confirmed squamous cell carcinoma      08/27/2016 Initial Diagnosis    Cancer of base of tongue ([place]): positive for p16      09/01/2016 Imaging    CT Neck: Large necrotic an ulcerating lesion at the right base of the tongue measuring 3.9 cm. Pathological lymphadenopathy in the right neck noted.      09/07/2016 Imaging    PET-CT: Large hypermetabolic mass at the right base of the tongue with extension across the midline. Significant hypermetabolic level II right-sided lymphadenopathy, possible pathological lymphadenopathy on the left side at level II.  09/24/2016 Procedure    Ultrasound and fluoroscopically guided right internal jugular single lumen power port catheter insertion. Tip in the SVC/RA junction. Catheter ready for use      09/24/2016 Procedure    Fluoroscopic insertion of a 20-French  "pull-through" gastrostomy.      09/28/2016 - 11/19/2016 Radiation Therapy    Site/dose:HN BOT // 70 Gy in 35 fractions         10/01/2016 - 11/12/2016 Chemotherapy    Carboplatin AUC2 QWk + Paclitaxel 45mg /m2 QWk concurrent with radiation therapy --Wk #1, 10/01/16: --Wk #2, 10/08/16: --Wk #3, 10/14/16: --Wk #4, 10/22/16: --Wk #5, 10/29/16: --Wk #6, 11/05/16: skipped due to neutropenia --Wk #7, 11/12/16:      03/10/2017 PET scan    Decrease in hypermetabolism involving the tongue and floor of mouth. This does extend more anteriorly today. Although the portion of this could be related to radiation change, residual disease is suspected. 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 No evidence of hypermetabolic extra cervical disease.       Medical History: Past Medical History:  Diagnosis Date  . Cancer ([place])    Tongue  . History of radiation therapy 09/28/16- 11/19/16   head and neck, base of tongue 70 Gy total in 35 fractions  . Hyperlipidemia   . Hypertension     Surgical History: Past Surgical History:  Procedure Laterality Date  . ELBOW SURGERY Right   . IR FLUORO GUIDE PORT INSERTION RIGHT  09/24/2016  . IR GASTROSTOMY TUBE MOD SED  09/24/2016  . IR US GUIDE VASC ACCESS RIGHT  09/24/2016  . KNEE SURGERY Bilateral    Not total knee replacements  . SHOULDER ARTHROSCOPY Right     Family History: History reviewed. No pertinent family history.  Social History: Social History   Socioeconomic History  . Marital status: Married    Spouse name: Not on file  . Number of children: 2  . Years of education: Not on file  . Highest education level: Not on file  Social Needs  . Financial resource strain: Not on file  . Food insecurity - worry: Not on file  . Food insecurity - inability: Not on file  . Transportation needs - medical: Not on file  . Transportation needs - non-medical: Not on file  Occupational History  . Occupation: Retired     Comment: Engineer, technical sales  Tobacco Use  . Smoking status: Former Smoker    Packs/day: 0.50  . Smokeless tobacco: Never Used  . Tobacco comment: Quit in 1978  Substance and Sexual Activity  . Alcohol use: No  . Drug use: No  . Sexual activity: Not Currently  Other Topics Concern  . Not on file  Social History Narrative  . Not on file    Allergies: No Known Allergies  Medications:  Current Outpatient Medications  Medication Sig Dispense Refill  . alum & mag hydroxide-simeth (MAALOX ADVANCED MAX ST) 400-400-40 MG/5ML suspension Take 30 mLs by mouth every 6 (six) hours as needed for indigestion. 355 mL 0  . lidocaine-prilocaine (EMLA) cream Apply to affected area once 30 g 3  . Morphine Sulfate (MORPHINE CONCENTRATE) 10 mg / 0.5 ml concentrated solution Take 1 mL (20 mg total) by mouth every 4 (four) hours as needed for severe pain. 240 mL 0  . Nutritional Supplements (FEEDING SUPPLEMENT, OSMOLITE 1.5 CAL,) LIQD Give 1.5 bottles Osmolite 1.5 QID with 60 mL free water before and after via PEG. Increase to 2 bottles once daily and  continue 1.5 bottles TID as tolerated. Drink or flush PEG with additional 240 mL free water TID. Send supplies and formula. Call daughter, [name], at [phone] to arrange delivery for patient. 1541 mL 0  . omeprazole (PRILOSEC) 20 MG capsule Take 1 capsule (20 mg total) by mouth 2 (two) times daily before a meal. 60 capsule 0  . amoxicillin-clavulanate (AUGMENTIN) 875-125 MG tablet Take 1 tablet by mouth 2 (two) times daily for 14 days. 28 tablet 0  . diphenhydrAMINE (BENYLIN) 12.5 MG/5ML syrup Take 10 mLs (25 mg total) by mouth at bedtime as needed for allergies or sleep. 473 mL 0  . gabapentin (NEURONTIN) 100 MG capsule Take 1 capsule (100 mg total) by mouth 3 (three) times daily. 90 capsule 0  . LORazepam (ATIVAN) 0.5 MG tablet Take 1 tablet (0.5 mg total) by mouth every 8 (eight) hours as needed for anxiety. 30 tablet 0   No current facility-administered medications for  this visit.     Review of Systems: Review of Systems  Constitutional: Negative for chills and diaphoresis.  HENT:   Positive for sore throat and trouble swallowing. Negative for mouth sores.   Respiratory: Positive for cough. Negative for chest tightness, shortness of breath and wheezing.   Gastrointestinal: Negative for nausea and vomiting.  All other systems reviewed and are negative.    PHYSICAL EXAMINATION Blood pressure (!) 146/91, pulse 81, temperature 98.6 F (37 C), temperature source Oral, resp. rate 20, height 5\' 9"  (1.753 m), weight 158 lb 4.8 oz (71.8 kg), SpO2 99 %.  ECOG PERFORMANCE STATUS: 2 - Symptomatic, <50% confined to bed  Physical Exam  Constitutional: He is oriented to person, place, and time and well-developed, well-nourished, and in no distress. No distress.  HENT:  Mouth/Throat: Oropharynx is clear and moist and mucous membranes are normal. No dental abscesses. No oropharyngeal exudate.  Edentulous, not wearing dentures. No visible lesion noted no anterior oral ulcerations, leukoplakia. Visible oropharynx without abnormality.  Significant oral edema of the gums and tongue noted.  Eyes: Conjunctivae and EOM are normal. Pupils are equal, round, and reactive to light. No scleral icterus.  Neck:  Resolution of palpable lymphadenopathy in the neck, but diffuse swelling is noted, unchanged from the previous visit.  Appearance of skin is significantly better with resolution of previously noted postradiation erythema.  Cardiovascular: Normal rate and regular rhythm.  No murmur heard. Pulmonary/Chest: Effort normal and breath sounds normal. No stridor. No respiratory distress. He has no wheezes. He has no rales.  Abdominal: Soft. Bowel sounds are normal. He exhibits no distension and no mass. There is no tenderness.  Musculoskeletal: Normal range of motion. He exhibits no edema.  Lymphadenopathy:    He has no cervical adenopathy.  Neurological: He is alert and  oriented to person, place, and time. He has normal reflexes. No cranial nerve deficit. Coordination normal.  Skin: Skin is warm and dry. He is not diaphoretic. No erythema.  Progressive radiation erythema lateral neck with mild desquamation, but no blistering.     LABORATORY DATA: I have personally reviewed the data as listed: Hospital Outpatient Visit on 03/11/2017  Component Date Value Ref Range Status  . TSH 03/11/2017 0.428  0.320 - 4.118 uIU/mL Final   Performed at [facility], [address] [place], [place] [address]  Hospital Outpatient Visit on 03/10/2017  Component Date Value Ref Range Status  . Glucose-Capillary 03/10/2017 91  65 - 99 mg/dL Final       Marinell Blight  Lebron Conners, MD

## 2017-03-31 NOTE — Assessment & Plan Note (Signed)
72 y.o. 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 No evidence of distal metastatic disease at this time based on the initial PET/CT. Patient was not a candidate for cisplatin-based chemotherapy due to pre-existing hearing loss requiring hearing aids.  Patient has completed chemoradiation therapy and is now in recovery phase which has been progressing with significant residual symptoms.    At the present time, the symptoms are dominated by recurrent swelling in the neck, now extending into the oral cavity including the swelling of the tongue making speech and swallowing difficult.  At this time, no evident airway compromise, no stridor, and no expiratory wheezing on lung auscultation.  Not entirely sure if this is just progressive edema or patient has developed angioedema as an allergic reaction to 1 of his medications or environmental factor.  Presence of infection or recurrent malignancy cannot be fully excluded.  Patient is afebrile and there is no purulent drainage so infection is lower on my differential.   Plan: --Continue Lorazepam to 1mg  PO Q6hrs PRN -Discontinue Maalox -Continue prednisone 20 mg daily for additional 7 days.  Continue diphenhydramine 25 mg nightly. - Start Neurontin 100 mg 3 times a day. -Return to clinic in 1 week.

## 2017-04-04 MED ORDER — BUSPIRONE HCL 5 MG PO TABS
5.00 | ORAL_TABLET | ORAL | Status: DC
Start: 2017-04-04 — End: 2017-04-04

## 2017-04-04 MED ORDER — ACETAMINOPHEN 500 MG PO TABS
1000.00 | ORAL_TABLET | ORAL | Status: DC
Start: ? — End: 2017-04-04

## 2017-04-04 MED ORDER — METHYLPREDNISOLONE SODIUM SUCC 125 MG IJ SOLR
125.00 | INTRAMUSCULAR | Status: DC
Start: ? — End: 2017-04-04

## 2017-04-04 MED ORDER — EPINEPHRINE PF 1 MG/ML IJ SOLN
.30 | INTRAMUSCULAR | Status: DC
Start: ? — End: 2017-04-04

## 2017-04-04 MED ORDER — FENTANYL 100 MCG/HR TD PT72
1.00 | MEDICATED_PATCH | TRANSDERMAL | Status: DC
Start: 2017-04-06 — End: 2017-04-04

## 2017-04-04 MED ORDER — SODIUM CHLORIDE 0.9 % IV SOLN
INTRAVENOUS | Status: DC
Start: ? — End: 2017-04-04

## 2017-04-04 MED ORDER — METHADONE HCL 5 MG/5ML PO SOLN
2.50 | ORAL | Status: DC
Start: 2017-04-04 — End: 2017-04-04

## 2017-04-04 MED ORDER — DULOXETINE HCL 30 MG PO CPEP
30.00 | ORAL_CAPSULE | ORAL | Status: DC
Start: 2017-04-05 — End: 2017-04-04

## 2017-04-04 MED ORDER — SENNOSIDES 8.6 MG PO TABS
8.60 | ORAL_TABLET | ORAL | Status: DC
Start: 2017-04-04 — End: 2017-04-04

## 2017-04-04 MED ORDER — DIPHENHYDRAMINE HCL 50 MG/ML IJ SOLN
25.00 | INTRAMUSCULAR | Status: DC
Start: ? — End: 2017-04-04

## 2017-04-04 MED ORDER — POLYETHYLENE GLYCOL 3350 17 G PO PACK
17.00 g | PACK | ORAL | Status: DC
Start: 2017-04-04 — End: 2017-04-04

## 2017-04-04 MED ORDER — GENERIC EXTERNAL MEDICATION
Status: DC
Start: 2017-04-05 — End: 2017-04-04

## 2017-04-04 MED ORDER — GABAPENTIN 300 MG PO CAPS
600.00 | ORAL_CAPSULE | ORAL | Status: DC
Start: 2017-04-04 — End: 2017-04-04

## 2017-04-04 MED ORDER — EPINEPHRINE PF 1 MG/ML IJ SOLN
0.30 | INTRAMUSCULAR | Status: DC
Start: ? — End: 2017-04-04

## 2017-04-04 MED ORDER — LORAZEPAM 0.5 MG PO TABS
.50 | ORAL_TABLET | ORAL | Status: DC
Start: ? — End: 2017-04-04

## 2017-04-04 MED ORDER — HYDROMORPHONE HCL 1 MG/ML PO LIQD
8.00 | ORAL | Status: DC
Start: ? — End: 2017-04-04

## 2017-04-04 MED ORDER — FENTANYL 25 MCG/HR TD PT72
1.00 | MEDICATED_PATCH | TRANSDERMAL | Status: DC
Start: 2017-04-06 — End: 2017-04-04

## 2017-04-04 MED ORDER — GENERIC EXTERNAL MEDICATION
50.00 | Status: DC
Start: 2017-04-05 — End: 2017-04-04

## 2017-04-04 MED ORDER — PROCHLORPERAZINE EDISYLATE 5 MG/ML IJ SOLN
5.00 | INTRAMUSCULAR | Status: DC
Start: ? — End: 2017-04-04

## 2017-04-04 MED ORDER — LIDOCAINE 5 % EX PTCH
1.00 | MEDICATED_PATCH | CUTANEOUS | Status: DC
Start: 2017-04-04 — End: 2017-04-04

## 2017-04-10 NOTE — Progress Notes (Signed)
[facility] Follow-up Visit:  Assessment: Cancer of base of tongue ([place]) 72 y.o. 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 No evidence of distal metastatic disease at this time based on the initial PET/CT. Patient was not a candidate for cisplatin-based chemotherapy due to pre-existing hearing loss requiring hearing aids.  Patient has completed chemoradiation therapy and is now in recovery phase which has been progressing with significant residual symptoms.    At the present time, the symptoms are dominated by recurrent swelling in the neck, now extending into the oral cavity including the swelling of the tongue making speech and swallowing difficult.  At this time, no evident airway compromise, no stridor, and no expiratory wheezing on lung auscultation recent imaging demonstrates concern for possible tumor persistence versus development of right basilar tongue abscess.  Plan: --Continue Lorazepam to 1mg  PO Q6hrs PRN -Taper prednisone --20 mg daily for 3 days subsequently 10 mg daily for 4 days and stop .  Continue diphenhydramine 25 mg nightly. -Start Augmentin 500/125 twice a day for 14 days. -Referral to [facility] for surgical evaluation and drainage of the suspected abscess. -Return to the clinic to be determined by the results of the assessment at [place] recognition software was used and creation of this note. Despite my best effort at editing the text, some misspelling/errors may have occurred.  No orders of the defined types were placed in this encounter.   Cancer Staging Cancer of base of tongue ([place]) Staging form: Pharynx - HPV-Mediated Oropharynx, AJCC 8th Edition - Clinical: Stage II (cT2, cN2, cM0, p16: Positive) - Signed by Eppie Gibson, MD on 09/14/2016   All questions were answered.  . The patient knows to call the clinic with any problems, questions or concerns.  This note was electronically signed.    History of Presenting Illness Joel Walton is a 72 y.o. 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 Patient is currently undergoing curative-intent treatment with chemoradiotherapy. He presents to the clinic for possible 4th week of carboplatin and paclitaxel therapy administered concurrently with radiation.  Patient returns to the clinic for continued symptom management.  Continues to complain of headaches and swelling in the mouth slightly improved on the current steroid medication.  No significant improvement in the burning sensation in the mouth from [place].  Tongue remains significantly swollen.  In the speech, but no significant shortness of breath at this time.  Case was reviewed from the tumor board with repeat imaging demonstrating either recurrent tumor or possible deep tissue abscess in the base of the tongue.  Oncological/hematological History:   Cancer of base of tongue ([place])   08/26/2016 Pathology Results    Tongue biopsy confirmed squamous cell carcinoma      08/27/2016 Initial Diagnosis    Cancer of base of tongue ([place]): positive for p16      09/01/2016 Imaging    CT Neck: Large necrotic an ulcerating lesion at the right base of the tongue measuring 3.9 cm. Pathological lymphadenopathy in the right neck noted.      09/07/2016 Imaging    PET-CT: Large hypermetabolic mass at the right base of the tongue with extension across the midline. Significant hypermetabolic level II right-sided lymphadenopathy,  possible pathological lymphadenopathy on the left side at level II.      09/24/2016 Procedure    Ultrasound and fluoroscopically guided right internal jugular single lumen power port catheter insertion. Tip in the  SVC/RA junction. Catheter ready for use      09/24/2016 Procedure    Fluoroscopic insertion of a 20-French "pull-through" gastrostomy.      09/28/2016 - 11/19/2016 Radiation Therapy    Site/dose:HN BOT // 70 Gy in 35 fractions         10/01/2016 - 11/12/2016 Chemotherapy    Carboplatin AUC2 QWk + Paclitaxel 45mg /m2 QWk concurrent with radiation therapy --Wk #1, 10/01/16: --Wk #2, 10/08/16: --Wk #3, 10/14/16: --Wk #4, 10/22/16: --Wk #5, 10/29/16: --Wk #6, 11/05/16: skipped due to neutropenia --Wk #7, 11/12/16:      03/10/2017 PET scan    Decrease in hypermetabolism involving the tongue and floor of mouth. This does extend more anteriorly today. Although the portion of this could be related to radiation change, residual disease is suspected. 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 No evidence of hypermetabolic extra cervical disease.       Medical History: Past Medical History:  Diagnosis Date  . Cancer ([place])    Tongue  . History of radiation therapy 09/28/16- 11/19/16   head and neck, base of tongue 70 Gy total in 35 fractions  . Hyperlipidemia   . Hypertension     Surgical History: Past Surgical History:  Procedure Laterality Date  . ELBOW SURGERY Right   . IR FLUORO GUIDE PORT INSERTION RIGHT  09/24/2016  . IR GASTROSTOMY TUBE MOD SED  09/24/2016  . IR US GUIDE VASC ACCESS RIGHT  09/24/2016  . KNEE SURGERY Bilateral    Not total knee replacements  . SHOULDER ARTHROSCOPY Right     Family History: History reviewed. No pertinent family history.  Social History: Social History   Socioeconomic History  . Marital status: Married    Spouse name: Not on file  . Number of children: 2  . Years of education: Not on file  . Highest education level: Not on file  Social Needs  . Financial resource strain: Not on file  . Food insecurity - worry: Not on file  . Food insecurity - inability: Not on file  . Transportation needs - medical: Not on file  .  Transportation needs - non-medical: Not on file  Occupational History  . Occupation: Retired     Comment: Personal assistant  Tobacco Use  . Smoking status: Former Smoker    Packs/day: 0.50  . Smokeless tobacco: Never Used  . Tobacco comment: Quit in 1978  Substance and Sexual Activity  . Alcohol use: No  . Drug use: No  . Sexual activity: Not Currently  Other Topics Concern  . Not on file  Social History Narrative  . Not on file    Allergies: No Known Allergies  Medications:  Current Outpatient Medications  Medication Sig Dispense Refill  . alum & mag hydroxide-simeth (MAALOX ADVANCED MAX ST) 400-400-40 MG/5ML suspension Take 30 mLs by mouth every 6 (six) hours as needed for indigestion. 355 mL 0  . diphenhydrAMINE (BENYLIN) 12.5 MG/5ML syrup Take 10 mLs (25 mg total) by mouth at bedtime as needed for allergies or sleep. 473 mL 0  . gabapentin (NEURONTIN) 100 MG capsule Take 1 capsule (100 mg total) by mouth 3 (three) times daily. 90 capsule 0  . lidocaine-prilocaine (EMLA) cream Apply to affected area once 30 g 3  . LORazepam (  ATIVAN) 0.5 MG tablet Take 1 tablet (0.5 mg total) by mouth every 8 (eight) hours as needed for anxiety. 30 tablet 0  . Morphine Sulfate (MORPHINE CONCENTRATE) 10 mg / 0.5 ml concentrated solution Take 1 mL (20 mg total) by mouth every 4 (four) hours as needed for severe pain. 240 mL 0  . Nutritional Supplements (FEEDING SUPPLEMENT, OSMOLITE 1.5 CAL,) LIQD Give 1.5 bottles Osmolite 1.5 QID with 60 mL free water before and after via PEG. Increase to 2 bottles once daily and continue 1.5 bottles TID as tolerated. Drink or flush PEG with additional 240 mL free water TID. Send supplies and formula. Call daughter, [name], at [phone] to arrange delivery for patient. 1541 mL 0  . omeprazole (PRILOSEC) 20 MG capsule Take 1 capsule (20 mg total) by mouth 2 (two) times daily before a meal. 60 capsule 0   No current facility-administered medications for this  visit.     Review of Systems: Review of Systems  Constitutional: Negative for chills and diaphoresis.  HENT:   Positive for sore throat and trouble swallowing. Negative for mouth sores.   Respiratory: Positive for cough. Negative for chest tightness, shortness of breath and wheezing.   Gastrointestinal: Negative for nausea and vomiting.  All other systems reviewed and are negative.    PHYSICAL EXAMINATION Blood pressure (!) 144/94, pulse 90, temperature 98.6 F (37 C), temperature source Oral, resp. rate 16, height 5\' 9"  (1.753 m), weight 158 lb 4.8 oz (71.8 kg), SpO2 98 %.  ECOG PERFORMANCE STATUS: 2 - Symptomatic, <50% confined to bed  Physical Exam  Constitutional: He is oriented to person, place, and time and well-developed, well-nourished, and in no distress. No distress.  HENT:  Mouth/Throat: Oropharynx is clear and moist and mucous membranes are normal. No dental abscesses. No oropharyngeal exudate.  Edentulous, not wearing dentures. No visible lesion noted no anterior oral ulcerations, leukoplakia. Visible oropharynx without abnormality.  Significant oral edema of the gums and tongue noted.  Eyes: Conjunctivae and EOM are normal. Pupils are equal, round, and reactive to light. No scleral icterus.  Neck:  Resolution of palpable lymphadenopathy in the neck, but diffuse swelling is noted, unchanged from the previous visit.  Appearance of skin is significantly better with resolution of previously noted postradiation erythema.  Cardiovascular: Normal rate and regular rhythm.  No murmur heard. Pulmonary/Chest: Effort normal and breath sounds normal. No stridor. No respiratory distress. He has no wheezes. He has no rales.  Abdominal: Soft. Bowel sounds are normal. He exhibits no distension and no mass. There is no tenderness.  Musculoskeletal: Normal range of motion. He exhibits no edema.  Lymphadenopathy:    He has no cervical adenopathy.  Neurological: He is alert and oriented to  person, place, and time. He has normal reflexes. No cranial nerve deficit. Coordination normal.  Skin: Skin is warm and dry. He is not diaphoretic. No erythema.  Progressive radiation erythema lateral neck with mild desquamation, but no blistering.     LABORATORY DATA: I have personally reviewed the data as listed: Hospital Outpatient Visit on 03/11/2017  Component Date Value Ref Range Status  . TSH 03/11/2017 0.428  0.320 - 4.118 uIU/mL Final   Performed at [facility], [place] [address] [place], [place] [address]       Ardath Sax, MD

## 2017-04-10 NOTE — Assessment & Plan Note (Signed)
72 y.o. 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 No evidence of distal metastatic disease at this time based on the initial PET/CT. Patient was not a candidate for cisplatin-based chemotherapy due to pre-existing hearing loss requiring hearing aids.  Patient has completed chemoradiation therapy and is now in recovery phase which has been progressing with significant residual symptoms.    At the present time, the symptoms are dominated by recurrent swelling in the neck, now extending into the oral cavity including the swelling of the tongue making speech and swallowing difficult.  At this time, no evident airway compromise, no stridor, and no expiratory wheezing on lung auscultation recent imaging demonstrates concern for possible tumor persistence versus development of right basilar tongue abscess.  Plan: --Continue Lorazepam to 1mg  PO Q6hrs PRN -Taper prednisone --20 mg daily for 3 days subsequently 10 mg daily for 4 days and stop .  Continue diphenhydramine 25 mg nightly. -Start Augmentin 500/125 twice a day for 14 days. -Referral to [facility] for surgical evaluation and drainage of the suspected abscess. -Return to the clinic to be determined by the results of the assessment at [facility].

## 2017-05-11 ENCOUNTER — Ambulatory Visit: Payer: Medicare Other | Attending: Radiation Oncology

## 2017-06-14 ENCOUNTER — Other Ambulatory Visit: Payer: Self-pay

## 2017-06-14 ENCOUNTER — Encounter (HOSPITAL_COMMUNITY): Payer: Self-pay | Admitting: Emergency Medicine

## 2017-06-14 ENCOUNTER — Emergency Department (HOSPITAL_COMMUNITY): Payer: Medicare Other

## 2017-06-14 ENCOUNTER — Observation Stay (HOSPITAL_COMMUNITY)
Admission: EM | Admit: 2017-06-14 | Discharge: 2017-06-15 | Disposition: A | Discharge status: Home / Self Care | Payer: Medicare Other | Attending: Internal Medicine | Admitting: Internal Medicine

## 2017-06-14 DIAGNOSIS — R000 Tachycardia, unspecified: Secondary | ICD-10-CM | POA: Insufficient documentation

## 2017-06-14 DIAGNOSIS — M8580 Other specified disorders of bone density and structure, unspecified site: Secondary | ICD-10-CM | POA: Insufficient documentation

## 2017-06-14 DIAGNOSIS — D649 Anemia, unspecified: Secondary | ICD-10-CM | POA: Diagnosis not present

## 2017-06-14 DIAGNOSIS — Z923 Personal history of irradiation: Secondary | ICD-10-CM | POA: Insufficient documentation

## 2017-06-14 DIAGNOSIS — Z87891 Personal history of nicotine dependence: Secondary | ICD-10-CM | POA: Insufficient documentation

## 2017-06-14 DIAGNOSIS — R1310 Dysphagia, unspecified: Secondary | ICD-10-CM | POA: Insufficient documentation

## 2017-06-14 DIAGNOSIS — K5900 Constipation, unspecified: Secondary | ICD-10-CM | POA: Diagnosis not present

## 2017-06-14 DIAGNOSIS — I452 Bifascicular block: Secondary | ICD-10-CM | POA: Insufficient documentation

## 2017-06-14 DIAGNOSIS — E785 Hyperlipidemia, unspecified: Secondary | ICD-10-CM | POA: Insufficient documentation

## 2017-06-14 DIAGNOSIS — D6481 Anemia due to antineoplastic chemotherapy: Secondary | ICD-10-CM | POA: Insufficient documentation

## 2017-06-14 DIAGNOSIS — Z930 Tracheostomy status: Secondary | ICD-10-CM | POA: Insufficient documentation

## 2017-06-14 DIAGNOSIS — Z9889 Other specified postprocedural states: Secondary | ICD-10-CM | POA: Diagnosis not present

## 2017-06-14 DIAGNOSIS — C770 Secondary and unspecified malignant neoplasm of lymph nodes of head, face and neck: Secondary | ICD-10-CM | POA: Diagnosis not present

## 2017-06-14 DIAGNOSIS — Z6823 Body mass index (BMI) 23.0-23.9, adult: Secondary | ICD-10-CM | POA: Diagnosis not present

## 2017-06-14 DIAGNOSIS — G893 Neoplasm related pain (acute) (chronic): Secondary | ICD-10-CM | POA: Diagnosis not present

## 2017-06-14 DIAGNOSIS — R748 Abnormal levels of other serum enzymes: Secondary | ICD-10-CM | POA: Diagnosis not present

## 2017-06-14 DIAGNOSIS — E441 Mild protein-calorie malnutrition: Secondary | ICD-10-CM | POA: Diagnosis present

## 2017-06-14 DIAGNOSIS — I10 Essential (primary) hypertension: Secondary | ICD-10-CM | POA: Insufficient documentation

## 2017-06-14 DIAGNOSIS — R042 Hemoptysis: Principal | ICD-10-CM | POA: Diagnosis present

## 2017-06-14 DIAGNOSIS — C01 Malignant neoplasm of base of tongue: Secondary | ICD-10-CM | POA: Diagnosis not present

## 2017-06-14 DIAGNOSIS — C029 Malignant neoplasm of tongue, unspecified: Secondary | ICD-10-CM | POA: Diagnosis not present

## 2017-06-14 DIAGNOSIS — Z79899 Other long term (current) drug therapy: Secondary | ICD-10-CM | POA: Diagnosis not present

## 2017-06-14 DIAGNOSIS — I890 Lymphedema, not elsewhere classified: Secondary | ICD-10-CM | POA: Diagnosis not present

## 2017-06-14 LAB — COMPREHENSIVE METABOLIC PANEL
ALK PHOS: 95 U/L (ref 38–126)
ALT: 14 U/L — AB (ref 17–63)
AST: 15 U/L (ref 15–41)
Albumin: 3.1 g/dL — ABNORMAL LOW (ref 3.5–5.0)
Anion gap: 9 (ref 5–15)
BUN: 13 mg/dL (ref 6–20)
CALCIUM: 9.4 mg/dL (ref 8.9–10.3)
CO2: 32 mmol/L (ref 22–32)
CREATININE: 0.53 mg/dL — AB (ref 0.61–1.24)
Chloride: 92 mmol/L — ABNORMAL LOW (ref 101–111)
GFR calc non Af Amer: >60 mL/min (ref >60)
Glucose, Bld: 113 mg/dL — ABNORMAL HIGH (ref 65–99)
Potassium: 4 mmol/L (ref 3.5–5.1)
Sodium: 133 mmol/L — ABNORMAL LOW (ref 135–145)
Total Bilirubin: 0.6 mg/dL (ref 0.3–1.2)
Total Protein: 6.5 g/dL (ref 6.5–8.1)

## 2017-06-14 LAB — ABO/RH: ABO/RH(D): O NEG

## 2017-06-14 LAB — I-STAT CHEM 8, ED
BUN: 13 mg/dL (ref 6–20)
CHLORIDE: 90 mmol/L — AB (ref 101–111)
Calcium, Ion: 1.11 mmol/L — ABNORMAL LOW (ref 1.15–1.40)
Creatinine, Ser: 0.6 mg/dL — ABNORMAL LOW (ref 0.61–1.24)
GLUCOSE: 108 mg/dL — AB (ref 65–99)
HCT: 28 % — ABNORMAL LOW (ref 39.0–52.0)
Hemoglobin: 9.5 g/dL — ABNORMAL LOW (ref 13.0–17.0)
POTASSIUM: 3.9 mmol/L (ref 3.5–5.1)
Sodium: 131 mmol/L — ABNORMAL LOW (ref 135–145)
TCO2: 32 mmol/L (ref 22–32)

## 2017-06-14 LAB — PROTIME-INR
INR: 1.03
Prothrombin Time: 13.5 seconds (ref 11.4–15.2)

## 2017-06-14 LAB — CBC WITH DIFFERENTIAL/PLATELET
Basophils Absolute: 0 10*3/uL (ref 0.0–0.1)
Basophils Relative: 0 %
EOS ABS: 0 10*3/uL (ref 0.0–0.7)
EOS PCT: 0 %
HCT: 29.4 % — ABNORMAL LOW (ref 39.0–52.0)
Hemoglobin: 9.3 g/dL — ABNORMAL LOW (ref 13.0–17.0)
LYMPHS ABS: 0.8 10*3/uL (ref 0.7–4.0)
Lymphocytes Relative: 10 %
MCH: 29.1 pg (ref 26.0–34.0)
MCHC: 31.6 g/dL (ref 30.0–36.0)
MCV: 91.9 fL (ref 78.0–100.0)
MONOS PCT: 5 %
Monocytes Absolute: 0.4 10*3/uL (ref 0.1–1.0)
Neutro Abs: 7.2 10*3/uL (ref 1.7–7.7)
Neutrophils Relative %: 85 %
PLATELETS: 246 10*3/uL (ref 150–400)
RBC: 3.2 MIL/uL — AB (ref 4.22–5.81)
RDW: 17.3 % — ABNORMAL HIGH (ref 11.5–15.5)
WBC: 8.5 10*3/uL (ref 4.0–10.5)

## 2017-06-14 LAB — TYPE AND SCREEN
ABO/RH(D): O NEG
Antibody Screen: NEGATIVE

## 2017-06-14 MED ORDER — OSMOLITE 1.5 CAL PO LIQD
474.0000 mL | Freq: Every day | ORAL | Status: DC
  Administered 2017-06-15: 474 mL
  Filled 2017-06-14: qty 474

## 2017-06-14 MED ORDER — PANTOPRAZOLE SODIUM 40 MG PO TBEC: 40.0000 mg | DELAYED_RELEASE_TABLET | Freq: Every day | ORAL | Status: DC

## 2017-06-14 MED ORDER — ONDANSETRON HCL 4 MG PO TABS: 4.0000 mg | ORAL_TABLET | Freq: Four times a day (QID) | ORAL | Status: DC | PRN

## 2017-06-14 MED ORDER — MORPHINE SULFATE (CONCENTRATE) 10 MG /0.5 ML PO SOLN: 20.0000 mg | ORAL | Status: DC | PRN

## 2017-06-14 MED ORDER — ONDANSETRON HCL 4 MG/2ML IJ SOLN: 4.0000 mg | Freq: Four times a day (QID) | INTRAMUSCULAR | Status: DC | PRN

## 2017-06-14 MED ORDER — OSMOLITE 1.5 CAL PO LIQD
355.0000 mL | ORAL | Status: DC
  Administered 2017-06-15: 355 mL
  Filled 2017-06-14 (×2): qty 474

## 2017-06-14 MED ORDER — DULOXETINE HCL 30 MG PO CPEP
30.0000 mg | ORAL_CAPSULE | Freq: Every day | ORAL | Status: DC
  Filled 2017-06-14: qty 1

## 2017-06-14 MED ORDER — OSMOLITE 1.5 CAL PO LIQD
355.0000 mL | ORAL | Status: DC
  Filled 2017-06-14: qty 474

## 2017-06-14 MED ORDER — ACETAMINOPHEN 325 MG PO TABS: 650.0000 mg | ORAL_TABLET | Freq: Four times a day (QID) | ORAL | Status: DC | PRN

## 2017-06-14 MED ORDER — LORAZEPAM 1 MG PO TABS: 0.5000 mg | ORAL_TABLET | Freq: Three times a day (TID) | ORAL | Status: DC | PRN

## 2017-06-14 MED ORDER — GABAPENTIN 100 MG PO CAPS: 100.0000 mg | ORAL_CAPSULE | Freq: Three times a day (TID) | ORAL | Status: DC

## 2017-06-14 MED ORDER — ACETAMINOPHEN 650 MG RE SUPP: 650.0000 mg | Freq: Four times a day (QID) | RECTAL | Status: DC | PRN

## 2017-06-14 MED ORDER — FENTANYL 25 MCG/HR TD PT72
100.0000 ug | MEDICATED_PATCH | TRANSDERMAL | Status: DC
  Administered 2017-06-15: 100 ug via TRANSDERMAL
  Filled 2017-06-14: qty 4

## 2017-06-14 NOTE — ED Provider Notes (Signed)
[facility] EMERGENCY DEPARTMENT Provider Note   CSN: [id number] Arrival date & time: 06/14/17  2037     History   Chief Complaint Chief Complaint  Patient presents with  . Hematemesis    HPI Joel Walton is a 72 y.o. male.  The history is provided by the patient.  Cough  This is a new problem. The current episode started less than 1 hour ago. The problem occurs constantly. The problem has not changed since onset.The cough is productive of bloody sputum. There has been no fever. Pertinent negatives include no chest pain, no chills, no ear pain, no headaches, no rhinorrhea, no sore throat and no shortness of breath. He has tried nothing for the symptoms. Risk factors: Hx of tongue cancer, s/p trach in Feb 2019, s/p radiation therapy in 2018. He is not a smoker.     Past Medical History:  Diagnosis Date  . Cancer ([place])    Tongue  . History of radiation therapy 09/28/16- 11/19/16   head and neck, base of tongue 70 Gy total in 35 fractions  . Hyperlipidemia   . Hypertension     Patient Active Problem List   Diagnosis Date Noted  . Hx of tracheostomy   . Cough with hemoptysis   . Lymphedema of face 03/10/2017  . Elevated liver enzymes 10/07/2016  . 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 Other constipation 09/29/2016  . Mild protein malnutrition ([place]) 09/29/2016  . Dysphagia 09/29/2016  . 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 Tongue cancer ([place]) 09/14/2016    Past Surgical History:  Procedure Laterality Date  . ELBOW SURGERY Right   . IR FLUORO GUIDE PORT INSERTION RIGHT  09/24/2016  . IR GASTROSTOMY TUBE MOD SED  09/24/2016  . IR US GUIDE VASC ACCESS RIGHT  09/24/2016  . KNEE SURGERY Bilateral    Not total knee replacements  . SHOULDER ARTHROSCOPY Right         Home Medications    Prior to Admission medications   Medication Sig Start Date End Date Taking? Authorizing Provider  fentaNYL (DURAGESIC - DOSED MCG/HR) 100 MCG/HR  Place 100 mcg onto the skin every 3 (three) days. 04/26/17  Yes [provider]  alum & mag hydroxide-simeth (MAALOX ADVANCED MAX ST) 782-423-53 MG/5ML suspension Take 30 mLs by mouth every 6 (six) hours as needed for indigestion. 02/24/17   Ardath Sax, MD  diphenhydrAMINE (BENYLIN) 12.5 MG/5ML syrup Take 10 mLs (25 mg total) by mouth at bedtime as needed for allergies or sleep. 03/16/17 04/15/17  Ardath Sax, MD  DULoxetine (CYMBALTA) 30 MG capsule Take 30 mg by mouth daily. 06/14/17   [provider]  gabapentin (NEURONTIN) 100 MG capsule Take 1 capsule (100 mg total) by mouth 3 (three) times daily. 03/11/17 04/10/17  Ardath Sax, MD  lidocaine-prilocaine (EMLA) cream Apply to affected area once 09/29/16   Ardath Sax, MD  LORazepam (ATIVAN) 0.5 MG tablet Take 1 tablet (0.5 mg total) by mouth every 8 (eight) hours as needed for anxiety. 03/17/17   Ardath Sax, MD  Morphine Sulfate (MORPHINE CONCENTRATE) 10 mg / 0.5 ml concentrated solution Take 1 mL (20 mg total) by mouth every 4 (four) hours as needed for severe pain. 02/17/17   Ardath Sax, MD  Nutritional Supplements (FEEDING SUPPLEMENT, OSMOLITE 1.5 CAL,) LIQD Give 1.5 bottles Osmolite 1.5 QID with 60 mL free water before and after via PEG. Increase to 2 bottles once daily and  continue 1.5 bottles TID as tolerated. Drink or flush PEG with additional 240 mL free water TID. Send supplies and formula. Call daughter, [name], at [phone] to arrange delivery for patient. 10/05/16   Eppie Gibson, MD  omeprazole (PRILOSEC) 20 MG capsule Take 1 capsule (20 mg total) by mouth 2 (two) times daily before a meal. 03/08/17 04/07/17  Ardath Sax, MD    Family History History reviewed. No pertinent family history.  Social History Social History   Tobacco Use  . Smoking status: Former Smoker    Packs/day: 0.50  . Smokeless tobacco: Never Used  . Tobacco comment: Quit in 1978  Substance Use Topics  . Alcohol  use: No  . Drug use: No     Allergies   Patient has no known allergies.   Review of Systems Review of Systems  Constitutional: Negative for chills and fever.  HENT: Negative for ear pain, rhinorrhea and sore throat.   Eyes: Negative for pain and visual disturbance.  Respiratory: Positive for cough. Negative for shortness of breath.   Cardiovascular: Negative for chest pain and palpitations.  Gastrointestinal: Negative for abdominal pain and vomiting.  Genitourinary: Negative for dysuria and hematuria.  Musculoskeletal: Negative for arthralgias and back pain.  Skin: Negative for color change and rash.  Neurological: Negative for seizures, syncope and headaches.  All other systems reviewed and are negative.    Physical Exam Updated Vital Signs BP 127/68   Pulse 94   Temp 99.3 F (37.4 C) (Temporal)   Resp (!) 30   Ht 5\' 9"  (1.753 m)   Wt 71.7 kg (158 lb)   SpO2 95%   BMI 23.33 kg/m   Physical Exam  Constitutional: He is oriented to person, place, and time. He appears well-developed and well-nourished. No distress.  HENT:  Head: Normocephalic.  There is a palpable neck mass in the submandibular space. The tongue is enlarged. There is red blood in the mouth without an obvious source of bleeding.  Eyes: Pupils are equal, round, and reactive to light. Conjunctivae and EOM are normal.  Neck: Neck supple.  Tracheostomy in place. There is persistent hemoptysis from the trach with bright red blood mixed with sputum. The tissue around the trach is not actively bleeding.  Cardiovascular: Regular rhythm.  No murmur heard. Tachycardic regular rhythm  Pulmonary/Chest: Effort normal. No respiratory distress.  Diffuse rhonchi  Abdominal: Soft. He exhibits no distension. There is no tenderness.  Musculoskeletal: He exhibits no edema or deformity.  Neurological: He is alert and oriented to person, place, and time.  Skin: Skin is warm and dry.  Psychiatric: He has a normal mood and  affect.  Nursing note and vitals reviewed.    ED Treatments / Results  Labs (all labs ordered are listed, but only abnormal results are displayed) Labs Reviewed  CBC WITH DIFFERENTIAL/PLATELET - Abnormal; Notable for the following components:      Result Value   RBC 3.20 (*)    Hemoglobin 9.3 (*)    HCT 29.4 (*)    RDW 17.3 (*)    All other components within normal limits  COMPREHENSIVE METABOLIC PANEL - Abnormal; Notable for the following components:   Sodium 133 (*)    Chloride 92 (*)    Glucose, Bld 113 (*)    Creatinine, Ser 0.53 (*)    Albumin 3.1 (*)    ALT 14 (*)    All other components within normal limits  I-STAT CHEM 8, ED - Abnormal; Notable for the  following components:   Sodium 131 (*)    Chloride 90 (*)    Creatinine, Ser 0.60 (*)    Glucose, Bld 108 (*)    Calcium, Ion 1.11 (*)    Hemoglobin 9.5 (*)    HCT 28.0 (*)    All other components within normal limits  PROTIME-INR  TYPE AND SCREEN  ABO/RH    EKG None  Radiology Dg Chest Portable 1 View  Result Date: 06/14/2017 CLINICAL DATA:  72 year old male with hemoptysis. History of cancer of the base of the tongue. EXAM: PORTABLE CHEST 1 VIEW COMPARISON:  Chest radiograph dated 09/23/2014 and PET-CT dated 03/10/2017 FINDINGS: Evaluation is limited due to positioning of the patient and superimposition of the patient's jaw over the lung apices. A tracheostomy is noted above the carina. Right pectoral Port-A-Cath with tip at the cavoatrial junction. There are bibasilar streaky densities, likely atelectatic changes. Slight prominence of the right hilar/infrahilar densities also likely atelectatic changes although pneumonia is not excluded. Clinical correlation is recommended. There is no pleural effusion or pneumothorax. Stable cardiac silhouette. Osteopenia with degenerative changes of the spine and shoulders. No acute osseous pathology. IMPRESSION: 1. Bibasilar hazy and streaky densities, likely atelectatic  changes. Infiltrate is less likely. Clinical correlation is recommended. No focal consolidation. 2. Tracheostomy above the carina and right-sided Port-A-Cath with tip at the cavoatrial junction. Electronically Signed   By: Anner Crete M.D.   On: 06/14/2017 21:15    Procedures Procedures (including critical care time)  Medications Ordered in ED Medications - No data to display   Initial Impression / Assessment and Plan / ED Course  I have reviewed the triage vital signs and the nursing notes.  Pertinent labs & imaging results that were available during my care of the patient were reviewed by me and considered in my medical decision making (see chart for details).     Patient is a 72 year old male with history of tongue cancer status post radiation therapy and tracheostomy, who presents with hemoptysis.  This started about 1 hour prior to arrival.  He was sitting on the couch when he started having the cough.  He denies any pain, shortness of breath, or other complaints at this time.  He is not anticoagulated.  He is mildly tachycardic here.  He continues to have persistent small-volume hemoptysis without any significant distress.  He does have blood present in his oral cavity but no obvious source of bleeding.  His lungs are diffusely rhonchorous.  Pulmonary consulted for possible bronchoscopy.  Pulmonary has seen the patient and feels that bronchoscopy in a more controlled setting would be appropriate given that he is hemodynamically stable and having small volume hemoptysis.  His tachycardia has improved.  His hemoglobin is noted to be 9.3 which is down from 10.9 in January.  However, review of care everywhere reveals that in April of this year, his hemoglobin was 7.6.  Patient will be admitted to the hospitalist for observation.  Pulmonology is unable to visualize the source of bleeding at this time, however we both suspect it is coming from the tongue cancer.  Pulmonology will consult ENT in  the morning to discuss best steps for bronchoscopy.  Final Clinical Impressions(s) / ED Diagnoses   Final diagnoses:  Hemoptysis    ED Discharge Orders    None       Clifton James, MD 06/14/17 2236    Noemi Chapel, MD 06/14/17 [phone]

## 2017-06-14 NOTE — H&P (Signed)
History and Physical    Joel Walton:[id number] DOB: 1945/06/05 DOA: 06/14/2017  PCP: Lorene Dy, MD  Patient coming from: Home.  Chief Complaint: Coughing of blood.  HPI: Joel Walton is a 72 y.o. 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  Sometimes started this morning.  Denies any difficulty breathing or any chest pain.  Denies any pain or any blood thinners.  Patient is receiving chemotherapy at [facility].  Has had tracheostomy and PEG tube placement February 2019.  ED Course: In the ER patient's hemoglobin is found to be around 9 and chest x-ray shows nonspecific infiltrates.  On-call pulmonary critical care was consulted patient admitted for possible further management including bronchoscopy.  Review of Systems: As per HPI, rest all negative.   Past Medical History:  Diagnosis Date  . Cancer ([place])    Tongue  . History of radiation therapy 09/28/16- 11/19/16   head and neck, base of tongue 70 Gy total in 35 fractions  . Hyperlipidemia   . Hypertension     Past Surgical History:  Procedure Laterality Date  . ELBOW SURGERY Right   . IR FLUORO GUIDE PORT INSERTION RIGHT  09/24/2016  . IR GASTROSTOMY TUBE MOD SED  09/24/2016  . IR US GUIDE VASC ACCESS RIGHT  09/24/2016  . KNEE SURGERY Bilateral    Not total knee replacements  . SHOULDER ARTHROSCOPY Right      reports that he has quit smoking. He smoked 0.50 packs per day. He has never used smokeless tobacco. He reports that he does not drink alcohol or use drugs.  No Known Allergies  Family History  Problem Relation Age of Onset  . Tongue cancer Neg Hx     Prior to Admission medications   Medication Sig Start Date End Date Taking? Authorizing Provider  fentaNYL (DURAGESIC - DOSED MCG/HR) 100 MCG/HR Place 100 mcg onto  the skin every 3 (three) days. 04/26/17  Yes [provider]  alum & mag hydroxide-simeth (MAALOX ADVANCED MAX ST) [phone] MG/5ML suspension Take 30 mLs by mouth every 6 (six) hours as needed for indigestion. 02/24/17   Ardath Sax, MD  diphenhydrAMINE (BENYLIN) 12.5 MG/5ML syrup Take 10 mLs (25 mg total) by mouth at bedtime as needed for allergies or sleep. 03/16/17 04/15/17  Ardath Sax, MD  DULoxetine (CYMBALTA) 30 MG capsule Take 30 mg by mouth daily. 06/14/17   [provider]  gabapentin (NEURONTIN) 100 MG capsule Take 1 capsule (100 mg total) by mouth 3 (three) times daily. 03/11/17 04/10/17  Ardath Sax, MD  lidocaine-prilocaine (EMLA) cream Apply to affected area once 09/29/16   Ardath Sax, MD  LORazepam (ATIVAN) 0.5 MG tablet Take 1 tablet (0.5 mg total) by mouth every 8 (eight) hours as needed for anxiety. 03/17/17   Ardath Sax, MD  Morphine Sulfate (MORPHINE CONCENTRATE) 10 mg / 0.5 ml concentrated solution Take 1 mL (20 mg total) by mouth every 4 (four) hours as needed for severe pain. 02/17/17   Ardath Sax, MD  Nutritional Supplements (FEEDING SUPPLEMENT, OSMOLITE 1.5 CAL,) LIQD Give 1.5 bottles Osmolite 1.5 QID with 60 mL free water before and after via PEG. Increase to 2 bottles once daily and continue 1.5 bottles TID as tolerated. Drink or flush PEG with additional 240 mL free water TID.  Send supplies and formula. Call daughter, [name], at [phone] to arrange delivery for patient. 10/05/16   Eppie Gibson, MD  omeprazole (PRILOSEC) 20 MG capsule Take 1 capsule (20 mg total) by mouth 2 (two) times daily before a meal. 03/08/17 04/07/17  Ardath Sax, MD    Physical Exam: Vitals:   06/14/17 2045 06/14/17 2115 06/14/17 2145 06/14/17 2230  BP: (!) 110/59 111/75 127/68 111/79  Pulse: (!) 126 93 94 86  Resp: 19 (!) 21 (!) 30 20  Temp:      TempSrc:      SpO2: (!) 89% 94% 95% 95%  Weight:      Height:          Constitutional:  Moderately built and nourished. Vitals:   06/14/17 2045 06/14/17 2115 06/14/17 2145 06/14/17 2230  BP: (!) 110/59 111/75 127/68 111/79  Pulse: (!) 126 93 94 86  Resp: 19 (!) 21 (!) 30 20  Temp:      TempSrc:      SpO2: (!) 89% 94% 95% 95%  Weight:      Height:       Eyes: Anicteric no pallor. ENMT: No discharge from the ears eyes nose or mouth. Neck: Tracheostomy in place.   Respiratory: No rhonchi or crepitations. Cardiovascular: S1-S2 heard no murmurs appreciated. Abdomen: Soft nontender bowel sounds present.  PEG tube in site. Musculoskeletal: Mild edema both lower extremities. Skin: No rash. Neurologic: Alert awake oriented to time place and person.  Moves all extremities. Psychiatric: Normal.  Normal affect.   Labs on Admission: I have personally reviewed following labs and imaging studies  CBC: Recent Labs  Lab 06/14/17 2045 06/14/17 2058  WBC 8.5  --   NEUTROABS 7.2  --   HGB 9.3* 9.5*  HCT 29.4* 28.0*  MCV 91.9  --   PLT 246  --    Basic Metabolic Panel: Recent Labs  Lab 06/14/17 2045 06/14/17 2058  NA 133* 131*  K 4.0 3.9  CL 92* 90*  CO2 32  --   GLUCOSE 113* 108*  BUN 13 13  CREATININE 0.53* 0.60*  CALCIUM 9.4  --    GFR: Estimated Creatinine Clearance: 85.9 mL/min (A) (by C-G formula based on SCr of 0.6 mg/dL (L)). Liver Function Tests: Recent Labs  Lab 06/14/17 2045  AST 15  ALT 14*  ALKPHOS 95  BILITOT 0.6  PROT 6.5  ALBUMIN 3.1*   No results for input(s): LIPASE, AMYLASE in the last 168 hours. No results for input(s): AMMONIA in the last 168 hours. Coagulation Profile: Recent Labs  Lab 06/14/17 2045  INR 1.03   Cardiac Enzymes: No results for input(s): CKTOTAL, CKMB, CKMBINDEX, TROPONINI in the last 168 hours. BNP (last 3 results) No results for input(s): PROBNP in the last 8760 hours. HbA1C: No results for input(s): HGBA1C in the last 72 hours. CBG: No results for input(s): GLUCAP in the last 168 hours. Lipid  Profile: No results for input(s): CHOL, HDL, LDLCALC, TRIG, CHOLHDL, LDLDIRECT in the last 72 hours. Thyroid Function Tests: No results for input(s): TSH, T4TOTAL, FREET4, T3FREE, THYROIDAB in the last 72 hours. Anemia Panel: No results for input(s): VITAMINB12, FOLATE, FERRITIN, TIBC, IRON, RETICCTPCT in the last 72 hours. Urine analysis: No results found for: COLORURINE, APPEARANCEUR, LABSPEC, PHURINE, GLUCOSEU, HGBUR, BILIRUBINUR, KETONESUR, PROTEINUR, UROBILINOGEN, NITRITE, LEUKOCYTESUR Sepsis Labs: @LABRCNTIP (procalcitonin:4,lacticidven:4) )No results found for this or any previous visit (from the past 240 hour(s)).   Radiological Exams on Admission: Dg Chest Portable 1 View  Result Date: 06/14/2017 CLINICAL DATA:  72 year old male with hemoptysis. History of cancer of the base of the tongue. EXAM: PORTABLE CHEST 1 VIEW COMPARISON:  Chest radiograph dated 09/23/2014 and PET-CT dated 03/10/2017 FINDINGS: Evaluation is limited due to positioning of the patient and superimposition of the patient's jaw over the lung apices. A tracheostomy is noted above the carina. Right pectoral Port-A-Cath with tip at the cavoatrial junction. There are bibasilar streaky densities, likely atelectatic changes. Slight prominence of the right hilar/infrahilar densities also likely atelectatic changes although pneumonia is not excluded. Clinical correlation is recommended. There is no pleural effusion or pneumothorax. Stable cardiac silhouette. Osteopenia with degenerative changes of the spine and shoulders. No acute osseous pathology. IMPRESSION: 1. Bibasilar hazy and streaky densities, likely atelectatic changes. Infiltrate is less likely. Clinical correlation is recommended. No focal consolidation. 2. Tracheostomy above the carina and right-sided Port-A-Cath with tip at the cavoatrial junction. Electronically Signed   By: Anner Crete M.D.   On: 06/14/2017 21:15    EKG: Independently reviewed.  Sinus  tachycardia with RBBB.  Assessment/Plan Principal Problem:   Hemoptysis Active Problems:   Tongue cancer ([place])   Mild protein malnutrition (HCC)   Hx of tracheostomy   Normochromic normocytic anemia    1. Hemoptysis -appreciate pulmonary critical care consult.  Follow CBC.  Pulmonary critical care likely to do bronchoscopy if hemoptysis persists.  Since chest x-ray shows possible infiltrates Patient on Zosyn. 2. Normocytic normochromic anemia likely from blood loss and chemotherapy -follow CBC. 3. Squamous cell tongue cancer being followed at [facility]. 4. Mild protein calorie nutrition -patient receives PEG tube feedings.  I have discussed with pharmacy to dose. 5. History of tracheostomy and PEG tube placement.   DVT prophylaxis: SCDs. Code Status: Full code. Family Communication: No family at the bedside. Disposition Plan: Home. Consults called: Pulmonary critical care. Admission status: Observation.   Rise Patience MD [facility] Pager [phone].  If 7PM-7AM, please contact night-coverage [email or web address] Password [email or web address]  06/14/2017, 11:01 PM

## 2017-06-14 NOTE — ED Triage Notes (Signed)
Pt here by [facility] from home. Pt reports vomiting blood through his mouth and it coming out of his trach starting 30 min ago.  A and O x4. Lurline Idol was placed in February. Pt reports no difficulty breathing, and no trauma to the trach.

## 2017-06-14 NOTE — Consult Note (Addendum)
.. ..  Name: Joel Walton MRN: [id number] DOB: [birth year]    ADMISSION DATE:  06/14/2017 CONSULTATION DATE:  06/14/17  REFERRING MD :  Melina Copa MD - EDP  CHIEF COMPLAINT:  Hemoptysis  BRIEF PATIENT DESCRIPTION: 72 yr old male with PMHx significant for squamous cell cancer of the base of tongue s/p trach in Feb 2019 presents with complaints of coughing up blood.  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 Since initial intervention pt has now relapsed locoregional adv with rapid progression and is not a candidate for upfront salvage resection or re-irradiation. S/p trach 03/22/17 for mgmt of critical airway and is G tube dept. He has been receiving PRBC transfusions to keep Hgb >8 to help with fatigue for chemo induced anemia.    Per documentation from [facility] where he receives all of his oncological care) pt was started on urgent induction of TPF chemotherapy and has received 3 cycles so far, last delivered on 05/11/17. Despite chemotherapy the cancer was too bulky for salvage surgery/RT. He was started on Nivolumab  on his last f/u Hem Onc dated 06/01/17.   Pt presents from home after having episodes of coughing up blood that was decribed as "chunky pieces". When he was at home it was coming from his mouth, since arriving to the ED he reports that it is now coming from both his mouth and trach opening. He denies shortness of breath, chest pain, palpitations, pleuritic pain, fevers, or bleeding from any other site.  He denies being on any anticoagulation.  PAST MEDICAL HISTORY :   has a past medical history of Cancer [facility]), History of radiation therapy (09/28/16- 11/19/16),  Hyperlipidemia, and Hypertension.  has a past surgical history that includes Knee surgery (Bilateral); Elbow surgery (Right); Shoulder arthroscopy (Right); IR GASTROSTOMY TUBE MOD SED (09/24/2016); IR US Guide Vasc Access Right (09/24/2016); and IR FLUORO GUIDE PORT INSERTION RIGHT (09/24/2016). Prior to Admission medications   Medication Sig Start Date End Date Taking? Authorizing Provider  fentaNYL (DURAGESIC - DOSED MCG/HR) 100 MCG/HR Place 100 mcg onto the skin every 3 (three) days. 04/26/17  Yes [provider]  alum & mag hydroxide-simeth (MAALOX ADVANCED MAX ST) [phone] MG/5ML suspension Take 30 mLs by mouth every 6 (six) hours as needed for indigestion. 02/24/17   Ardath Sax, MD  diphenhydrAMINE (BENYLIN) 12.5 MG/5ML syrup Take 10 mLs (25 mg total) by mouth at bedtime as needed for allergies or sleep. 03/16/17 04/15/17  Ardath Sax, MD  DULoxetine (CYMBALTA) 30 MG capsule Take 30 mg by mouth daily. 06/14/17   [provider]  gabapentin (NEURONTIN) 100 MG capsule Take 1 capsule (100 mg total) by mouth 3 (three) times daily. 03/11/17 04/10/17  Ardath Sax, MD  lidocaine-prilocaine (EMLA) cream Apply to affected area once 09/29/16   Ardath Sax, MD  LORazepam (ATIVAN) 0.5 MG tablet Take 1 tablet (0.5 mg total) by mouth every 8 (eight) hours as needed for anxiety. 03/17/17   Ardath Sax, MD  Morphine Sulfate (MORPHINE CONCENTRATE) 10 mg / 0.5 ml concentrated solution Take 1 mL (20 mg total) by mouth every 4 (four) hours as needed for severe pain. 02/17/17   Perlov,  Marinell Blight, MD  Nutritional Supplements (FEEDING SUPPLEMENT, OSMOLITE 1.5 CAL,) LIQD Give 1.5 bottles Osmolite 1.5 QID with 60 mL free water before and after via PEG. Increase to 2 bottles once daily and continue 1.5 bottles TID as tolerated. Drink or flush PEG with additional 240 mL free water TID. Send supplies and formula. Call daughter, [name], at [phone] to arrange delivery for patient. 10/05/16    Eppie Gibson, MD  omeprazole (PRILOSEC) 20 MG capsule Take 1 capsule (20 mg total) by mouth 2 (two) times daily before a meal. 03/08/17 04/07/17  Lebron Conners, Marinell Blight, MD   No Known Allergies  FAMILY HISTORY:  family history is not on file. SOCIAL HISTORY:  reports that he has quit smoking. He smoked 0.50 packs per day. He has never used smokeless tobacco. He reports that he does not drink alcohol or use drugs.  REVIEW OF SYSTEMS:  pertinent positives are bolded Constitutional: Negative for fever, chills, weight loss, malaise/fatigue and diaphoresis.  HENT: Negative for hearing loss, ear pain, nosebleeds, congestion, sore throat, neck pain, tinnitus and ear discharge.   Eyes: Negative for blurred vision, double vision, photophobia, pain, discharge and redness.  Respiratory: +cough, +hemoptysis, Negative for sputum production, shortness of breath, wheezing and stridor.   Cardiovascular: Negative for chest pain, palpitations, orthopnea, claudication, leg swelling and PND.  Gastrointestinal: Negative for heartburn, nausea, vomiting, abdominal pain, diarrhea, constipation, blood in stool and melena.  Genitourinary: Negative for dysuria, urgency, frequency, hematuria and flank pain.  Musculoskeletal: Negative for myalgias, back pain, joint pain and falls.  Skin: Negative for itching and rash.  Neurological: Negative for dizziness, tingling, tremors, sensory change, speech change, focal weakness, seizures, loss of consciousness, weakness and headaches.  Endo/Heme/Allergies: Negative for environmental allergies and polydipsia. Does not bruise/bleed easily.  SUBJECTIVE:   VITAL SIGNS: Temp:  [99.3 F (37.4 C)] 99.3 F (37.4 C) (05/07 2041) Pulse Rate:  [93-126] 94 (05/07 2145) Resp:  [12-30] 30 (05/07 2145) BP: (110-129)/(59-81) 127/68 (05/07 2145) SpO2:  [89 %-95 %] 95 % (05/07 2145) Weight:  [71.7 kg (158 lb)] 71.7 kg (158 lb) (05/07 2044)  PHYSICAL EXAMINATION: General:  In no acute  distress, well nourished Neuro:   AAOx4, no focal deficits HEENT:  Hardened mass behind trach, trach in position, + tenderness on palpation around trach site. Clotted blood expectorated from oropharynx, small amount of bright red blood infreq. Seen from trach. Cardiovascular:  S1 and S2 appreciated HR 90s Lungs:  Air entry+ in lower bases no crackles no rhonci,  Abdomen:  Soft, non tender, Gtube Musculoskeletal:  No gross deformities Skin:  No gross deformities  Recent Labs  Lab 06/14/17 2045 06/14/17 2058  NA 133* 131*  K 4.0 3.9  CL 92* 90*  CO2 32  --   BUN 13 13  CREATININE 0.53* 0.60*  GLUCOSE 113* 108*   Recent Labs  Lab 06/14/17 2045 06/14/17 2058  HGB 9.3* 9.5*  HCT 29.4* 28.0*  WBC 8.5  --   PLT 246  --    Dg Chest Portable 1 View  Result Date: 06/14/2017 CLINICAL DATA:  72 year old male with hemoptysis. History of cancer of the base of the tongue. EXAM: PORTABLE CHEST 1 VIEW COMPARISON:  Chest radiograph dated 09/23/2014 and PET-CT dated 03/10/2017 FINDINGS: Evaluation is limited due to positioning of the patient and superimposition of the patient's jaw over the lung apices. A tracheostomy is noted above the carina. Right pectoral Port-A-Cath with tip at the cavoatrial junction. There are bibasilar streaky densities, likely  atelectatic changes. Slight prominence of the right hilar/infrahilar densities also likely atelectatic changes although pneumonia is not excluded. Clinical correlation is recommended. There is no pleural effusion or pneumothorax. Stable cardiac silhouette. Osteopenia with degenerative changes of the spine and shoulders. No acute osseous pathology. IMPRESSION: 1. Bibasilar hazy and streaky densities, likely atelectatic changes. Infiltrate is less likely. Clinical correlation is recommended. No focal consolidation. 2. Tracheostomy above the carina and right-sided Port-A-Cath with tip at the cavoatrial junction. Electronically Signed   By: Anner Crete  M.D.   On: 06/14/2017 21:15    ASSESSMENT / PLAN: 72 yr old male with PMHx significant for Oropharynx SqCC, HPV+ (originally stage II cT2N2M0), now locoregional recurrence not  A candidate for salvage surgery/RT due to the bulkiness of the tumor. S/p 3 cycles of TPF and now started on immunotherapy with nivolumab. Presents with hemoptysis.  PCCM consulted for Bronchoscopic evaluation of source. I have reviewed CXR, examined the patient, reviewed labs and available medical history.  Plan: Known Head and neck CA which is presumed source of bleeding Unlikely to be pulmonary source as pt is not desaturating, hemodynamically stable with no increased work of breathing. No need for urgent bronchoscopy at this time. Recommend at this time supportive care. 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 Discussed plan with ED team in detail.    I, Dr Seward Carol have personally reviewed patient's available data, including medical history, events of note, physical examination and test results as part of my evaluation. I have discussed with NP [place] and other care providers such as pharmacist, RN and RRT. The patient is ill with multiple organ systems failure and requires high complexity decision making for assessment and support, frequent evaluation and titration of therapies, application of advanced monitoring technologies and extensive interpretation of multiple databases. Critical Care Consult Time devoted to patient care services described in this note is 45 Minutes. This time reflects time of care of this signee Dr Seward Carol. This critical care time does not reflect procedure time, or teaching time or supervisory time of NP but could involve care discussion time, Pt remains hemodynamically stable and not in acute distress and is not requiring ICU admission or mgmt at this  time.   DISPOSITION: Admit to Hospitalist CONSULTS( recommended):  ENT CC TIME: 45 mins PROGNOSIS: Guarded FAMILY: at bedside, wife and son   Signed Dr Seward Carol Pulmonary Critical Care Locums  06/14/2017, 10:11 PM

## 2017-06-14 NOTE — Progress Notes (Signed)
On arrival noted massive hemoptysis coming out of his mouth and around his trach site. Pt is vomiting up blood. Pt has history of tongue cancer pt stated. Pt denies difficulty breathing. Respirations are not labored. Pt states no trauma to the trach site.

## 2017-06-15 DIAGNOSIS — R042 Hemoptysis: Secondary | ICD-10-CM | POA: Diagnosis not present

## 2017-06-15 LAB — BASIC METABOLIC PANEL
Anion gap: 9 (ref 5–15)
BUN: 11 mg/dL (ref 6–20)
CHLORIDE: 91 mmol/L — AB (ref 101–111)
CO2: 32 mmol/L (ref 22–32)
Calcium: 8.9 mg/dL (ref 8.9–10.3)
Creatinine, Ser: 0.49 mg/dL — ABNORMAL LOW (ref 0.61–1.24)
GFR calc Af Amer: >60 mL/min (ref >60)
Glucose, Bld: 128 mg/dL — ABNORMAL HIGH (ref 65–99)
Potassium: 3.6 mmol/L (ref 3.5–5.1)
SODIUM: 132 mmol/L — AB (ref 135–145)

## 2017-06-15 LAB — CBC
HCT: 26.9 % — ABNORMAL LOW (ref 39.0–52.0)
HCT: 26 % — ABNORMAL LOW (ref 39.0–52.0)
HCT: 27.6 % — ABNORMAL LOW (ref 39.0–52.0)
HEMOGLOBIN: 8.3 g/dL — AB (ref 13.0–17.0)
Hemoglobin: 8.6 g/dL — ABNORMAL LOW (ref 13.0–17.0)
Hemoglobin: 8.7 g/dL — ABNORMAL LOW (ref 13.0–17.0)
MCH: 29.6 pg (ref 26.0–34.0)
MCH: 29.4 pg (ref 26.0–34.0)
MCH: 29 pg (ref 26.0–34.0)
MCHC: 32 g/dL (ref 30.0–36.0)
MCHC: 31.9 g/dL (ref 30.0–36.0)
MCHC: 31.5 g/dL (ref 30.0–36.0)
MCV: 92.4 fL (ref 78.0–100.0)
MCV: 92.2 fL (ref 78.0–100.0)
MCV: 92 fL (ref 78.0–100.0)
PLATELETS: 234 10*3/uL (ref 150–400)
Platelets: 223 10*3/uL (ref 150–400)
Platelets: 240 10*3/uL (ref 150–400)
RBC: 2.91 MIL/uL — ABNORMAL LOW (ref 4.22–5.81)
RBC: 2.82 MIL/uL — ABNORMAL LOW (ref 4.22–5.81)
RBC: 3 MIL/uL — ABNORMAL LOW (ref 4.22–5.81)
RDW: 17.5 % — ABNORMAL HIGH (ref 11.5–15.5)
RDW: 17.6 % — ABNORMAL HIGH (ref 11.5–15.5)
RDW: 17.3 % — AB (ref 11.5–15.5)
WBC: 7.4 10*3/uL (ref 4.0–10.5)
WBC: 5.6 10*3/uL (ref 4.0–10.5)
WBC: 5.2 10*3/uL (ref 4.0–10.5)

## 2017-06-15 LAB — CBG MONITORING, ED
Glucose-Capillary: 141 mg/dL — ABNORMAL HIGH (ref 65–99)
Glucose-Capillary: 101 mg/dL — ABNORMAL HIGH (ref 65–99)

## 2017-06-15 MED ORDER — PIPERACILLIN-TAZOBACTAM 3.375 G IVPB 30 MIN
3.3750 g | Freq: Once | INTRAVENOUS | Status: AC
  Administered 2017-06-15: 3.375 g via INTRAVENOUS
  Filled 2017-06-15: qty 50

## 2017-06-15 MED ORDER — PIPERACILLIN-TAZOBACTAM 3.375 G IVPB: 3.3750 g | Freq: Three times a day (TID) | INTRAVENOUS | Status: DC

## 2017-06-15 MED ORDER — HYDROMORPHONE HCL 2 MG PO TABS
8.0000 mg | ORAL_TABLET | Freq: Four times a day (QID) | ORAL | Status: DC | PRN
  Administered 2017-06-15 (×2): 8 mg
  Filled 2017-06-15 (×2): qty 4

## 2017-06-15 MED ORDER — BENZONATATE 100 MG PO CAPS: 100.0000 mg | ORAL_CAPSULE | Freq: Three times a day (TID) | ORAL | 0 refills | Status: AC | PRN

## 2017-06-15 MED ORDER — SENNA 8.6 MG PO TABS
8.6000 mg | ORAL_TABLET | Freq: Every day | ORAL | Status: DC
Start: 2017-06-15 — End: 2017-06-15
  Administered 2017-06-15: 8.6 mg

## 2017-06-15 MED ORDER — GUAIFENESIN ER 600 MG PO TB12: 600.0000 mg | ORAL_TABLET | Freq: Two times a day (BID) | ORAL | 0 refills | Status: AC

## 2017-06-15 MED ORDER — GABAPENTIN 300 MG PO CAPS: 600.0000 mg | ORAL_CAPSULE | Freq: Three times a day (TID) | ORAL | Status: DC

## 2017-06-15 MED ORDER — RANITIDINE HCL 150 MG/10ML PO SYRP: 150.0000 mg | ORAL_SOLUTION | Freq: Two times a day (BID) | ORAL | Status: DC

## 2017-06-15 NOTE — Progress Notes (Signed)
Pharmacy Antibiotic Note  Joel Walton is a 72 y.o. male admitted on 06/14/2017 with aspiration pneumonia.  Pharmacy has been consulted for Zosyn dosing. WBC wnl. SCr 0.49. Afebrile   Plan: -Zosyn 3.375 gm IV Q 8 hours -Monitor CBC, renal fx, cultures and clinical progress  Height: 5\' 9"  (175.3 cm) Weight: 158 lb (71.7 kg) IBW/kg (Calculated) : 70.7  Temp (24hrs), Avg:99.3 F (37.4 C), Min:99.3 F (37.4 C), Max:99.3 F (37.4 C)  Recent Labs  Lab 06/14/17 2045 06/14/17 2058 06/15/17 0107 06/15/17 0343  WBC 8.5  --  7.4 5.6  CREATININE 0.53* 0.60*  --  0.49*    Estimated Creatinine Clearance: 85.9 mL/min (A) (by C-G formula based on SCr of 0.49 mg/dL (L)).    No Known Allergies  Antimicrobials this admission: Zosyn 5/8>>  Dose adjustments this admission: None   Microbiology results:   Thank you for allowing pharmacy to be a part of this patient's care.  Albertina Parr, PharmD., BCPS Clinical Pharmacist Clinical phone for 06/15/17 until 3:30pm: [phone] If after 3:30pm, please call main pharmacy at: [phone]

## 2017-06-15 NOTE — ED Notes (Signed)
Pt called out stating his throat was burning and hit cheeks were "warm"  Temp checked and noted to be 98.3. RR E/U, airway WNL. Pt given cool washcloth for face, will continue to monitor.

## 2017-06-15 NOTE — ED Notes (Signed)
Admitting MD in room at this time 

## 2017-06-15 NOTE — Evaluation (Signed)
Passy-Muir Speaking Valve - Evaluation Patient Details  Name: Joel Walton MRN: [id number] Date of Birth: [birth year]  Today's Date: 06/15/2017 Time: 0910-1030 SLP Time Calculation (min) (ACUTE ONLY): 80 min  Past Medical History:  Past Medical History:  Diagnosis Date  . Cancer ([place])    Tongue  . History of radiation therapy 09/28/16- 11/19/16   head and neck, base of tongue 70 Gy total in 35 fractions  . Hyperlipidemia   . Hypertension    Past Surgical History:  Past Surgical History:  Procedure Laterality Date  . ELBOW SURGERY Right   . IR FLUORO GUIDE PORT INSERTION RIGHT  09/24/2016  . IR GASTROSTOMY TUBE MOD SED  09/24/2016  . IR US GUIDE VASC ACCESS RIGHT  09/24/2016  . KNEE SURGERY Bilateral    Not total knee replacements  . SHOULDER ARTHROSCOPY Right    HPI:  Joel Walton is a 72 y.o. male who presents to the ER with complaints of coughing of blood from his mouth and also having blood coming through the tracheostomy.  Source still unknown, possible bronchoscopy. 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 Pt has a 6 cuffless shiley in place; tracheosotmy placed 03/22/17 due to worsening lingual edema. ENT office PRN with Dr. Carlis Abbott or Dr. Rosana Fret. CT Neck on 4/1 reports "There is a persistent large necrotic or fluid-filled defect of the base of tongue and sublingual space, floor of mouth. There is a thick rim of peripheral enhancement. There may be a defect into the collection posteriorly at the midline base of tongue and preepiglottic space." Pt has an MBS via [facility], report from radiologist states "The pt ingests all thicknesses of liquid barium without difficulty." But then also states that the pt penetrated and aspirated thin despite strategies, Minimal penetrate of nectar  with head turn left." The report also states the pt has had a laryngectomy with a creation of a neopharynx, but this not confirmed in other notes.  Pt has been referred to OP SLP, not sure if these sessions have occurred, notes not in Care Everywhere.  Pt phonates with finger occlusion. Reports he has a "cap" that looks like a baseball cap at home, it has two holes that he occludes for phonation. Does not recognize PMSV.    Assessment / Plan / Recommendation Clinical Impression  SLP requested to bring pt a PMSV due to losing his en route, but upon SLP arrival pt communicates that he is unfamiliar with PMSV. Pt reports his wife completes his trach care at home. He uses some sort of valve device for speech that he describes as a "baseball cap" with two holes in it that he occludes with his fingers. SLP provided minimal instruction to pt who immediately placed PMSV independently and stated, "this is nice!" Redirection of air to upper airway adequate, no signs of air trapping over 45 minutes of use with activity (walking to bathroom down the hall). Vocal quality clear but resonance abnormal given cancer site. Provided education to pt about use, precautions, verbal and written. Hesitant to provide unsupervised use given expectorating blood, and pt unknown to SLP, unsure if pt can manage use independently, but pt likely to d/c soon and no family available. Provided extensive written education and suggested f/u with [facility] via [facility] pulmonology.  SLP Visit Diagnosis: Aphonia (R49.1)    SLP  Assessment  All further Speech Lanaguage Pathology  needs can be addressed in the next venue of care    Follow Up Recommendations       Frequency and Duration         PMSV Trial PMSV was placed for: 45 minutes Able to redirect subglottic air through upper airway: Yes Able to Attain Phonation: Yes Voice Quality: Normal Able to Expectorate Secretions: No attempts Level of Secretion Expectoration with PMSV:  Not observed Breath Support for Phonation: Adequate Intelligibility: Intelligibility reduced Word: 75-100% accurate Phrase: 75-100% accurate Sentence: 75-100% accurate Conversation: 75-100% accurate Respirations During Trial: (WNL)   Tracheostomy Tube  Additional Tracheostomy Tube Assessment Trach Collar Period: all walking hours Secretion Description: thin minimal blood tinged Level of Secretion Expectoration: Tracheal    Vent Dependency  Vent Dependent: No    Cuff Deflation Trial  GO          Deveney Bayon, Katherene Ponto 06/15/2017, 12:21 PM

## 2017-06-15 NOTE — Progress Notes (Signed)
Pt requesting Joel Walton valve at this time stating his is at home and does not have a way to get it. RT will call speech therapy to see if they can provide one

## 2017-06-15 NOTE — Progress Notes (Signed)
Trach care done per RRT. No complications noted

## 2017-06-20 NOTE — Discharge Summary (Signed)
[facility] Discharge Summary   Patient: Joel Walton KDX:[id number]   PCP: Lorene Dy, MD DOB: Jan 28, 1946   Date of admission: 06/14/2017   Date of discharge: 06/15/2017   Discharge Diagnoses:  Principal Problem:   Hemoptysis Active Problems:   Tongue cancer ([place])   Mild protein malnutrition ([place])   Hx of tracheostomy   Normochromic normocytic anemia   Admitted From: home Disposition:  home  Recommendations for Outpatient Follow-up:  1. Please follow-up with PCP in 1 week, establish care with tracheostomy clinic here in 1 week. 2. Also follow-up with ENT at [facility] in 1 week and oncology in 1 week.  Follow-up Information    Lorene Dy, MD. Schedule an appointment as soon as possible for a visit in 1 week(s).   Specialty:  Internal Medicine Contact information: [place], [place] [address] [phone]        Erick Colace, NP. Call in 1 week(s).   Specialties:  Nurse Practitioner, Acute Care Why:  [place] information: [place] [address] [phone]        Wallin, Martin[place] L, MD. Schedule an appointment as soon as possible for a visit in 2 week(s).   Specialty:  Otolaryngology Contact information: [place] [address] [phone]        Dr Maxie Better. Schedule an appointment as soon as possible for a visit in 1 week(s).          Diet recommendation: on tube feeding  Activity: The patient is advised to gradually reintroduce usual activities.  Discharge Condition: good  Code Status: Full code  History of present illness: As per the H and P dictated on admission, "Joel Walton is a 72 y.o. male with history of squamous cell carcinoma of the tongue presently being treated at [facility] presents to the ER with complaints of coughing of blood and also having blood coming through the tracheostomy.  Sometimes started this morning.  Denies any difficulty  breathing or any chest pain.  Denies any pain or any blood thinners.  Patient is receiving chemotherapy at [facility].  Has had tracheostomy and PEG tube placement February 2019."  Hospital Course:  Summary of his active problems in the hospital is as following. Principal Problem:   Hemoptysis Presented with hemoptysis, with reports of clots of blood coming out along with a cough. No further hemoptysis reported here in the hospital. PCCM was consulted,, chest x-ray was negative for any abnormality or infiltrate. PCCM recommended that the patient does not require any bronchoscopy at present. Patient's hemoglobin x3 remained stable. Hemodynamically patient remained stable as well as his oxygen requirement was normal on room air. At present it was felt that the hemoptysis is coming from patient's head and neck cancer and recommended patient to follow-up with patient's primary oncologist as well as ENT as an outpatient for [facility].  Active Problems:   Tongue cancer [facility]) She is due for another dose of nivolumab at [place] not think that this needs to be stopped at present. Follow-up with primary oncologist.    Mild protein malnutrition ([place]) Continue home tube feeding.    Hx of tracheostomy She never had Passy-Muir valve with her tracheostomy. History performed in February 2019 by ENT at [facility]. Recommend patient to follow-up with ENT at [facility] as well as to establish care at trach care clinic here in [place].  Normochromic normocytic anemia H&H remained stable likely chemotherapy-induced. Recommend outpatient follow-up with hematology.  All other chronic medical condition were stable during the hospitalization.  Patient was ambulatory without any assistance. On the day of the discharge the patient's vitals were stable , and no other acute medical condition were reported by patient. the patient was felt safe to be  discharge at home with family.  Consultants: PCCM  Procedures: none  DISCHARGE MEDICATION: Allergies as of 06/15/2017   No Known Allergies     Medication List    TAKE these medications   benzonatate 100 MG capsule Commonly known as:  TESSALON PERLES Take 1 capsule (100 mg total) by mouth 3 (three) times daily as needed for cough.   DULoxetine 30 MG capsule Commonly known as:  CYMBALTA 30 mg daily. Per tube   feeding supplement (OSMOLITE 1.5 CAL) Liqd Give 1.5 bottles Osmolite 1.5 QID with 60 mL free water before and after via PEG. Increase to 2 bottles once daily and continue 1.5 bottles TID as tolerated. Drink or flush PEG with additional 240 mL free water TID. Send supplies and formula. Call daughter, Joel Walton, at [phone] to arrange delivery for patient.   fentaNYL 100 MCG/HR Commonly known as:  DURAGESIC - dosed mcg/hr Place 100 mcg onto the skin every 3 (three) days.   gabapentin 300 MG capsule Commonly known as:  NEURONTIN Place 600 mg into feeding tube 3 (three) times daily.   guaiFENesin 600 MG 12 hr tablet Commonly known as:  MUCINEX Take 1 tablet (600 mg total) by mouth 2 (two) times daily.   HYDROmorphone HCl 1 MG/ML Liqd Commonly known as:  DILAUDID Place 8 mg into feeding tube every 6 (six) hours as needed for moderate pain.   LORazepam 0.5 MG tablet Commonly known as:  ATIVAN Take 1 tablet (0.5 mg total) by mouth every 8 (eight) hours as needed for anxiety. What changed:    how to take this  when to take this   omeprazole 20 MG capsule Commonly known as:  PRILOSEC Take 1 capsule (20 mg total) by mouth 2 (two) times daily before a meal. What changed:    how to take this  when to take this  additional instructions   ranitidine 75 MG/5ML syrup Commonly known as:  ZANTAC Place 150 mg into feeding tube 2 (two) times daily.   senna 8.6 MG tablet Commonly known as:  SENOKOT Place 8.6 mg into feeding tube at bedtime.      No Known  Allergies Discharge Instructions    Ambulatory referral to Pulmonology   Complete by:  As directed    Discharge instructions   Complete by:  As directed    It is important that you read following instructions as well as go over your medication list with RN to help you understand your care after this hospitalization.  Discharge Instructions: Please follow-up with PCP in one week  Please request your primary care physician to go over all Hospital Tests and Procedure/Radiological results at the follow up,  Please get all Hospital records sent to your PCP by signing hospital release before you go home.   Do not take more than prescribed Pain, Sleep and Anxiety Medications. You were cared for by a hospitalist during your hospital stay. If you have any questions about your discharge medications or the care you received while you were in the hospital after you are discharged, you can call the unit and ask to speak with the hospitalist on call if  the hospitalist that took care of you is not available.  Once you are discharged, your primary care physician will handle any further medical issues. Please note that NO REFILLS for any discharge medications will be authorized once you are discharged, as it is imperative that you return to your primary care physician (or establish a relationship with a primary care physician if you do not have one) for your aftercare needs so that they can reassess your need for medications and monitor your lab values. You Must read complete instructions/literature along with all the possible adverse reactions/side effects for all the Medicines you take and that have been prescribed to you. Take any new Medicines after you have completely understood and accept all the possible adverse reactions/side effects. Wear Seat belts while driving.   Increase activity slowly   Complete by:  As directed      Discharge Exam: Filed Weights   06/14/17 2044  Weight: 71.7 kg (158 lb)    Vitals:   06/15/17 1306 06/15/17 1359  BP: 126/88 133/73  Pulse: 80 81  Resp: 18 20  Temp:    SpO2: 98% 95%   General: Appear in no distress, no Rash; Oral Mucosa moist. Cardiovascular: S1 and S2 Present, no Murmur, no JVD Respiratory: Bilateral Air entry present and Clear to Auscultation, no Crackles, no wheezes Abdomen: Bowel Sound present, Soft and no tenderness Extremities: no Pedal edema, no calf tenderness Neurology: Grossly no focal neuro deficit.  The results of significant diagnostics from this hospitalization (including imaging, microbiology, ancillary and laboratory) are listed below for reference.    Significant Diagnostic Studies: Dg Chest Portable 1 View  Result Date: 06/14/2017 CLINICAL DATA:  72 year old male with hemoptysis. History of cancer of the base of the tongue. EXAM: PORTABLE CHEST 1 VIEW COMPARISON:  Chest radiograph dated 09/23/2014 and PET-CT dated 03/10/2017 FINDINGS: Evaluation is limited due to positioning of the patient and superimposition of the patient's jaw over the lung apices. A tracheostomy is noted above the carina. Right pectoral Port-A-Cath with tip at the cavoatrial junction. There are bibasilar streaky densities, likely atelectatic changes. Slight prominence of the right hilar/infrahilar densities also likely atelectatic changes although pneumonia is not excluded. Clinical correlation is recommended. There is no pleural effusion or pneumothorax. Stable cardiac silhouette. Osteopenia with degenerative changes of the spine and shoulders. No acute osseous pathology. IMPRESSION: 1. Bibasilar hazy and streaky densities, likely atelectatic changes. Infiltrate is less likely. Clinical correlation is recommended. No focal consolidation. 2. Tracheostomy above the carina and right-sided Port-A-Cath with tip at the cavoatrial junction. Electronically Signed   By: Anner Crete M.D.   On: 06/14/2017 21:15    Microbiology: No results found for this or any  previous visit (from the past 240 hour(s)).   Labs: CBC: Recent Labs  Lab 06/14/17 2045 06/14/17 2058 06/15/17 0107 06/15/17 0343 06/15/17 0900  WBC 8.5  --  7.4 5.6 5.2  NEUTROABS 7.2  --   --   --   --   HGB 9.3* 9.5* 8.6* 8.3* 8.7*  HCT 29.4* 28.0* 26.9* 26.0* 27.6*  MCV 91.9  --  92.4 92.2 92.0  PLT 246  --  223 240 448   Basic Metabolic Panel: Recent Labs  Lab 06/14/17 2045 06/14/17 2058 06/15/17 0343  NA 133* 131* 132*  K 4.0 3.9 3.6  CL 92* 90* 91*  CO2 32  --  32  GLUCOSE 113* 108* 128*  BUN 13 13 11   CREATININE 0.53* 0.60* 0.49*  CALCIUM 9.4  --  8.9   Liver Function Tests: Recent Labs  Lab 06/14/17 2045  AST 15  ALT 14*  ALKPHOS 95  BILITOT 0.6  PROT 6.5  ALBUMIN 3.1*   No results for input(s): LIPASE, AMYLASE in the last 168 hours. No results for input(s): AMMONIA in the last 168 hours. Cardiac Enzymes: No results for input(s): CKTOTAL, CKMB, CKMBINDEX, TROPONINI in the last 168 hours. BNP (last 3 results) No results for input(s): BNP in the last 8760 hours. CBG: Recent Labs  Lab 06/15/17 0103 06/15/17 0829  GLUCAP 141* 101*   Time spent: 35 minutes  Signed:  Berle Mull  [facility] 06/15/2017  8:29 AM

## 2017-07-19 ENCOUNTER — Telehealth: Payer: Self-pay

## 2017-07-19 NOTE — Telephone Encounter (Signed)
Phone call placed to patient's wife to offer to schedule visit with Palliative Care. Wife shared that patient continues to demonstrate pain. She was able to administer prescribed pain med and anxiety med which had been effective at this time. Visit scheduled with Palliative Care for 07/20/17.

## 2017-07-20 ENCOUNTER — Other Ambulatory Visit: Payer: Medicare Other | Admitting: Hospice and Palliative Medicine

## 2017-07-20 DIAGNOSIS — Z515 Encounter for palliative care: Secondary | ICD-10-CM

## 2017-07-20 NOTE — Progress Notes (Signed)
Arrived to patient's home but [facility] Nurse already there and has made contact with PCP and is sending patient to ED. So will not see patient this morning.

## 2017-08-01 ENCOUNTER — Telehealth: Payer: Self-pay

## 2017-08-01 NOTE — Telephone Encounter (Signed)
Phone call placed to patient's wife to offer to schedule visit with Palliative Care. Wife shared that patient is to be discharged today from the hospital and has an order for a hospice eval. Will follow up with referral center

## 2017-08-08 DEATH — deceased

## 2018-11-18 IMAGING — US IR FLUORO GUIDE CV LINE*R*
1 series · 1 of 1 positions shown · non-contrast
Comparison: none

CLINICAL DATA: Tonsillar carcinoma

[Series 1: ir fluoro/shunt/fist · 1 of 1 slices shown]
[im 1/1]
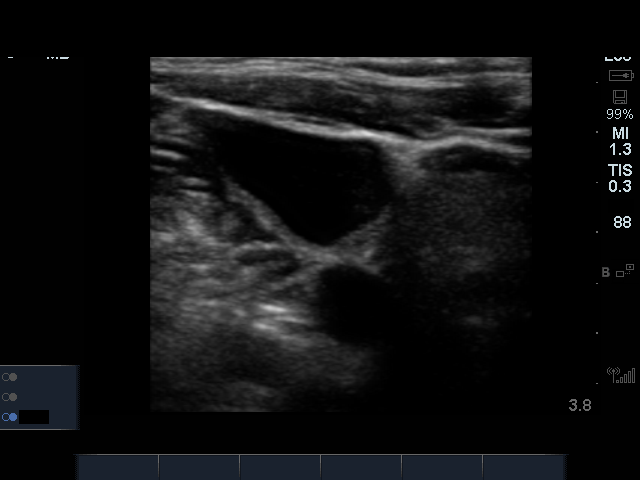

[1 of 1 positions shown; findings below may reference images not displayed]

EXAM:
RIGHT INTERNAL JUGULAR SINGLE LUMEN POWER PORT CATHETER INSERTION

Radiologist:  Bianca, Henny

Guidance:  Ultrasound and fluoroscopic

MEDICATIONS:
Ancef 2 g; The antibiotic was administered within an appropriate
time interval prior to skin puncture.

ANESTHESIA/SEDATION:
Versed 2.0 mg IV; Fentanyl 100 mcg IV;

Moderate Sedation Time:  31 minutes

The patient was continuously monitored during the procedure by the
interventional radiology nurse under my direct supervision.

FLUOROSCOPY TIME:  36 seconds (7 mGy)

COMPLICATIONS:
None immediate.

CONTRAST:  None.

PROCEDURE:
Informed consent was obtained from the patient following explanation
of the procedure, risks, benefits and alternatives. The patient
understands, agrees and consents for the procedure. All questions
were addressed. A time out was performed.

Maximal barrier sterile technique utilized including caps, mask,
sterile gowns, sterile gloves, large sterile drape, hand hygiene,
and 2% chlorhexidine scrub.

Under sterile conditions and local anesthesia, right internal
jugular micropuncture venous access was performed. Access was
performed with ultrasound. Images were obtained for documentation. A
guide wire was inserted followed by a transitional dilator. This
allowed insertion of a guide wire and catheter into the IVC.
Measurements were obtained from the SVC / RA junction back to the
right IJ venotomy site. In the right infraclavicular chest, a
subcutaneous pocket was created over the second anterior rib. This
was done under sterile conditions and local anesthesia. 1% lidocaine
with epinephrine was utilized for this. A 2.5 cm incision was made
in the skin. Blunt dissection was performed to create a subcutaneous
pocket over the right pectoralis major muscle. The pocket was
flushed with saline vigorously. There was adequate hemostasis. The
port catheter was assembled and checked for leakage. The port
catheter was secured in the pocket with two retention sutures. The
tubing was tunneled subcutaneously to the right venotomy site and
inserted into the SVC/RA junction through a valved peel-away sheath.
Position was confirmed with fluoroscopy. Images were obtained for
documentation. The patient tolerated the procedure well. No
immediate complications. Incisions were closed in a two layer
fashion with 4 - 0 Vicryl suture. Dermabond was applied to the skin.
The port catheter was accessed, blood was aspirated followed by
saline and heparin flushes. Needle was removed. A dry sterile
dressing was applied.
IMPRESSION: Ultrasound and fluoroscopically guided right internal jugular single
lumen power port catheter insertion. Tip in the SVC/RA junction.
Catheter ready for use.

## 2018-11-18 IMAGING — XA IR PERC PLACEMENT GASTROSTOMY
1 series · 9 of 9 positions shown · non-contrast
Comparison: none

INDICATION: Tonsillar carcinoma, dysphagia

[Series 300: tube placements · 9 of 9 slices shown]
[im 1/9]
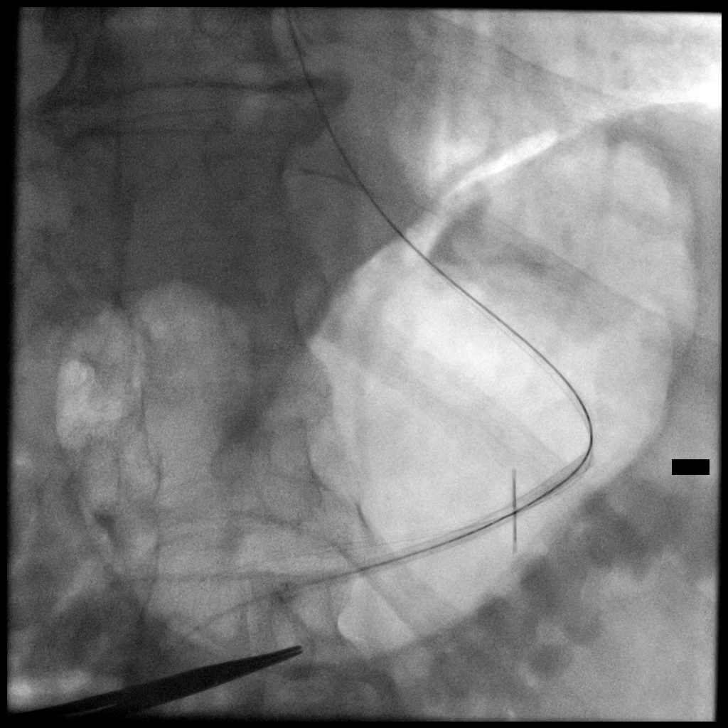
[im 2/9]
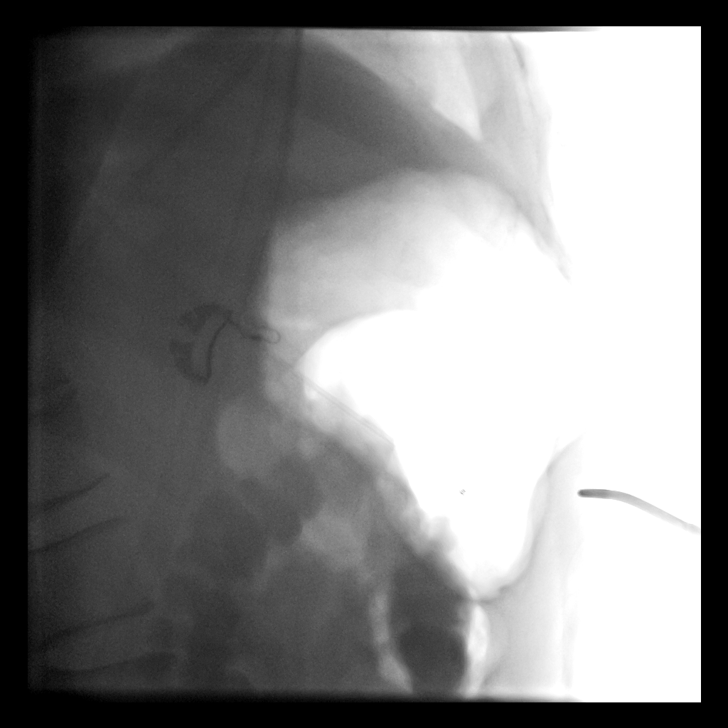
[im 3/9]
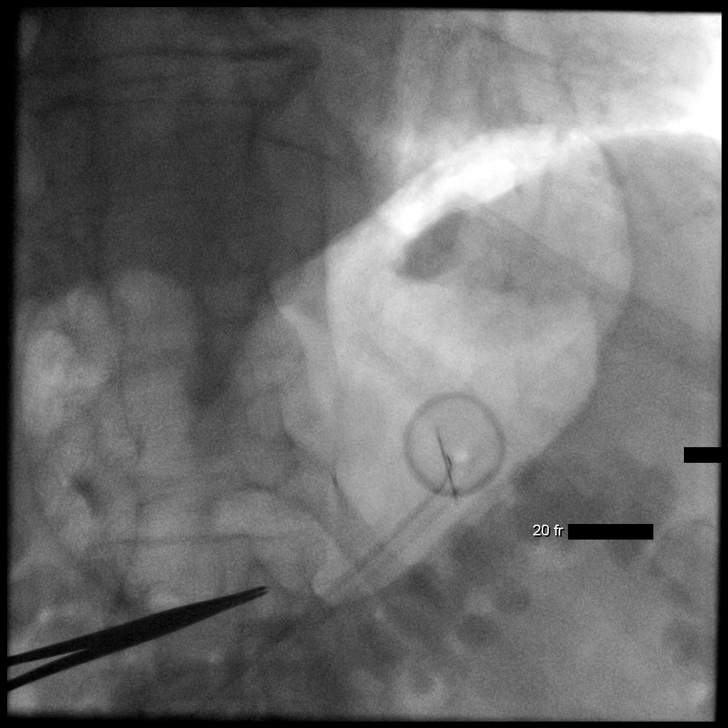
[im 4/9]
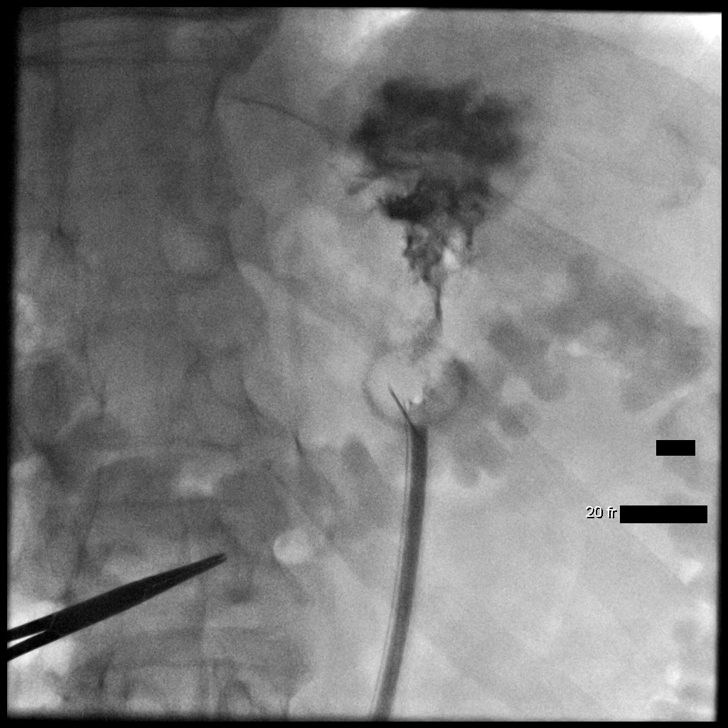
[im 5/9]
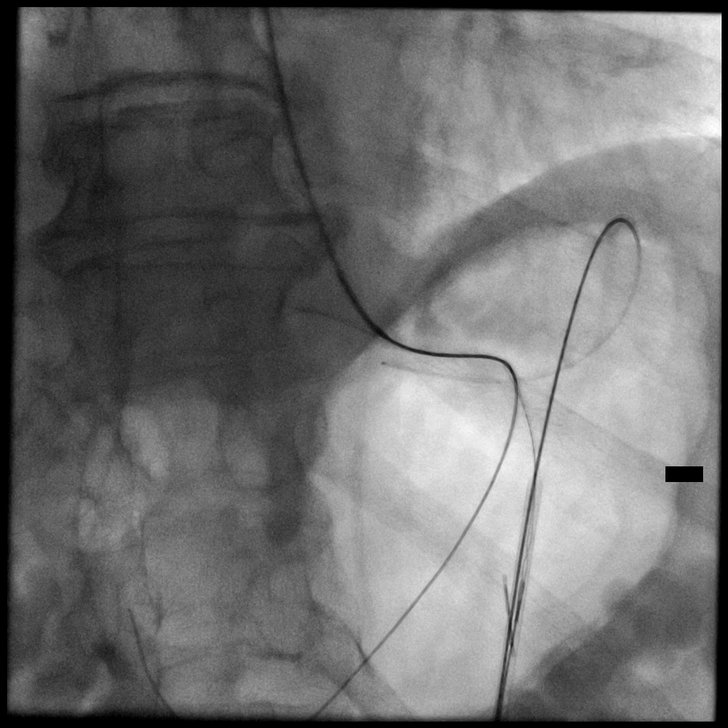
[im 6/9]
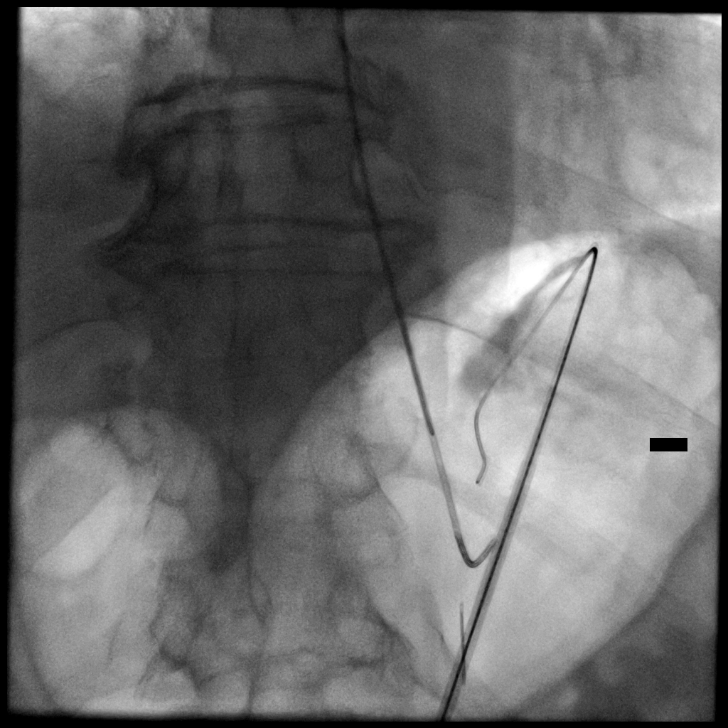
[im 7/9]
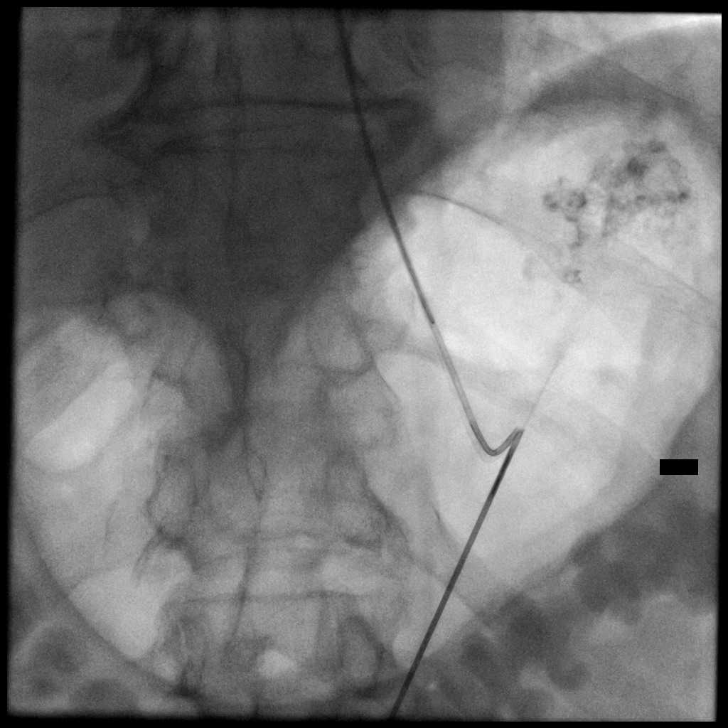
[im 8/9]
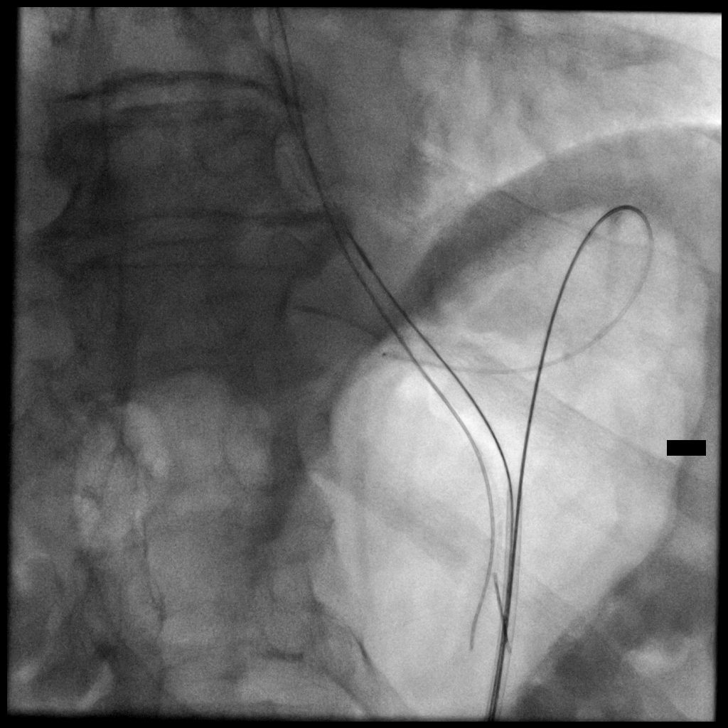
[im 9/9]
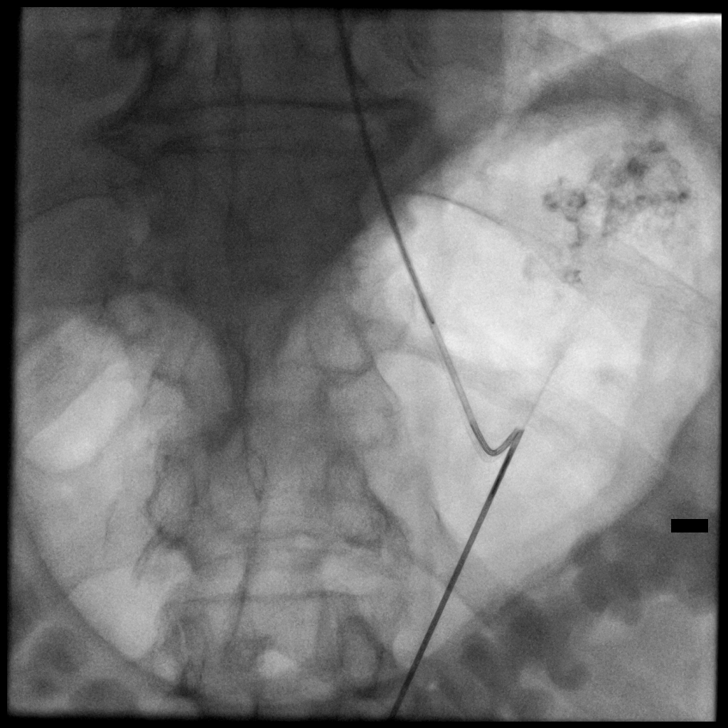

[9 of 9 positions shown; findings below may reference images not displayed]

EXAM:
FLUOROSCOPIC 20 FRENCH PULL-THROUGH GASTROSTOMY

Radiologist:  Vigar, Ioni Ivy

Guidance:  Fluoroscopic

MEDICATIONS:
Ancef 2 g; Antibiotics were administered within 1 hour of the
procedure. Glucagon 0.5 mg IV

ANESTHESIA/SEDATION:
Versed 2.0 mg IV; Fentanyl 100 mcg IV

Moderate Sedation Time:  18

The patient was continuously monitored during the procedure by the
interventional radiology nurse under my direct supervision.

CONTRAST:  10mL [id number] IOPAMIDOL ([id number]) INJECTION 61% -
administered into the gastric lumen.

FLUOROSCOPY TIME:  Fluoroscopy Time: 2 minutes 24 seconds (55 mGy).

COMPLICATIONS:
None immediate.

PROCEDURE:
Informed consent was obtained from the patient following explanation
of the procedure, risks, benefits and alternatives. The patient
understands, agrees and consents for the procedure. All questions
were addressed. A time out was performed.

Maximal barrier sterile technique utilized including caps, mask,
sterile gowns, sterile gloves, large sterile drape, hand hygiene,
and betadine prep.

The left upper quadrant was sterilely prepped and draped. An oral
gastric catheter was inserted into the stomach under fluoroscopy.
The existing nasogastric feeding tube was removed. Air was injected
into the stomach for insufflation and visualization under
fluoroscopy. The air distended stomach was confirmed beneath the
anterior abdominal wall in the frontal and lateral projections.
Under sterile conditions and local anesthesia, a 17 gauge trocar
needle was utilized to access the stomach percutaneously beneath the
left subcostal margin. Needle position was confirmed within the
stomach under biplane fluoroscopy. Contrast injection confirmed
position also. A single T tack was deployed for gastropexy. Over an
Amplatz guide wire, a 9-French sheath was inserted into the stomach.
A snare device was utilized to capture the oral gastric catheter.
The snare device was pulled retrograde from the stomach up the
esophagus and out the oropharynx. The 20-French pull-through
gastrostomy was connected to the snare device and pulled antegrade
through the oropharynx down the esophagus into the stomach and then
through the percutaneous tract external to the patient. The
gastrostomy was assembled externally. Contrast injection confirms
position in the stomach. Images were obtained for documentation. The
patient tolerated procedure well. No immediate complication.
IMPRESSION: Fluoroscopic insertion of a 20-French "pull-through" gastrostomy.

## 2019-02-28 NOTE — Therapy (Signed)
[facility] [address] [place], [place], [address] Phone: [phone]   Fax:  [phone]  Patient Details  Name: Joel Walton MRN: [id number] Date of Birth: [birth year] Referring Provider:  Eppie Gibson, MD  Encounter Date: 02/28/2019  SPEECH THERAPY DISCHARGE SUMMARY  Visits from Start of Care: approx 3  Current functional level related to goals / functional outcomes: Pt did not return after his last scheduled ST on 03-15-17. Goals and impression from that session are below:  SLP Short Term Goals - 01/26/17 1632              SLP SHORT TERM GOAL #1    Title  pt will complete HEP with rare min A over two sessions     Status  Achieved          SLP SHORT TERM GOAL #2    Title  pt will tell SLP why he is completing HEP over two sessions     Baseline  01-26-17     Status  Partially Met              SLP Long Term Goals - 03/15/17 1709              SLP LONG TERM GOAL #1    Title  pt will tell SLP 3 overt s/s aspiration PNA with modified independence     Status  Achieved          SLP LONG TERM GOAL #2    Title  pt will complete HEP with modified independence over two sessions      Baseline  03-15-16     Time  1     Period  -- vistis (next session in april 2019)     Status  Revised          SLP LONG TERM GOAL #3    Title  pt will tell SLP why a food journal is helpful in returning to most liberal diet     Status  Achieved              Plan - 03/15/17 1706     Clinical Impression Statement  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 Pt was modified independent with HEP today. See "skilled intervention" for more details. The probability of further swallowing difficulty remains high after chemo and radiation therapy. Pt will need to be followed by SLP for approx one more session to ensure accurate HEP completion as well as for safety with POs. Likely d/c  after next session in two months.       Remaining deficits: None. Pt unfortunately expired in mid-2019.   Education / Equipment: Late effects head/neck radiation on swallowing, HEP for swallowing  Plan: Patient agrees to discharge.  Patient goals were partially met. Patient is being discharged due to not returning since the last visit.  ?????       [place] ,[place], CCC-SLP  02/28/2019, 12:27 PM  [facility] [address] [place], [place], [address] Phone: [phone]   Fax:  [phone]

## 2019-05-04 IMAGING — PT NM PET TUM IMG RESTAG (PS) SKULL BASE T - THIGH
7 series · 25 of 25 positions shown · non-contrast
Comparison: PET of 09/07/2016.  Neck CT 03/02/2017

CLINICAL DATA: Subsequent treatment strategy for cancer of base of
tongue..

EXAM:
NUCLEAR MEDICINE PET SKULL BASE TO THIGH
TECHNIQUE: 8.2 mCi F-18 FDG was injected intravenously. Full-ring PET imaging
was performed from the skull base to thigh after the radiotracer. CT
data was obtained and used for attenuation correction and anatomic
localization.
FASTING BLOOD GLUCOSE:  Value: 91 mg/dl

[Series 3: pet hn_sk_thigh ac · axial · 5.0mm · 4.07mm/px · z∈[-1496,-556]mm · 5 of 236 slices shown]
[im 1/236]
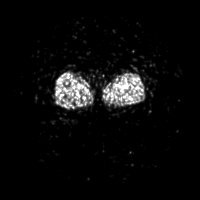
[im 59/236]
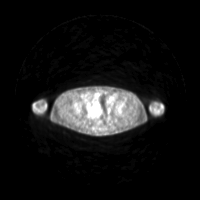
[im 118/236]
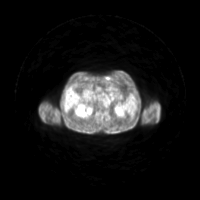
[im 177/236]
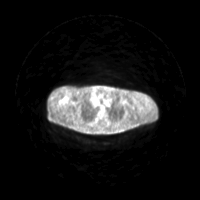
[im 236/236]
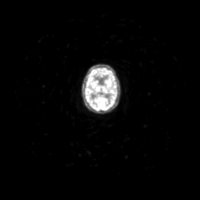

[Series 4: ct hn_sk_th 5.0 hd_fov · axial · 5.0mm · 1.17mm/px · z∈[-1496,-556]mm · 5 of 236 slices shown]
[im 1/236]
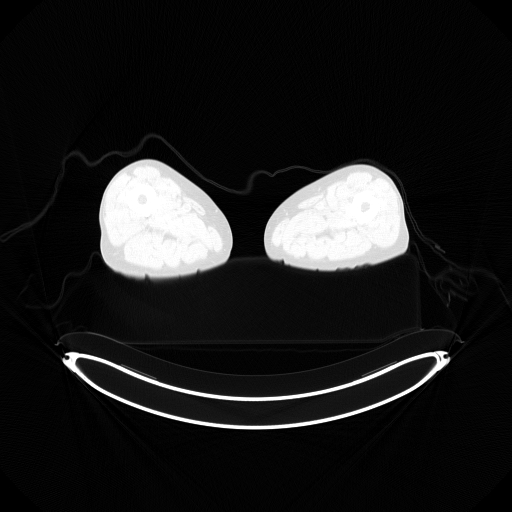
[im 59/236]
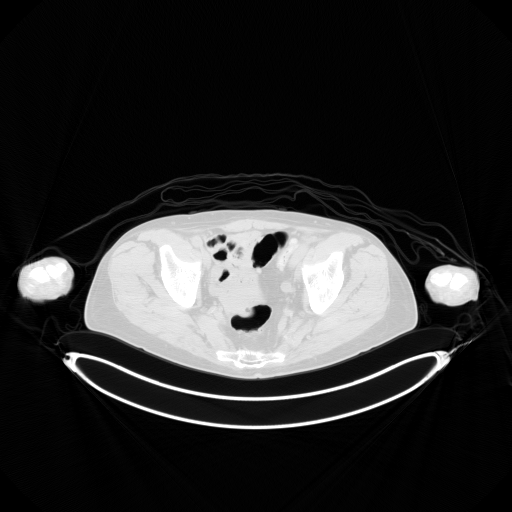
[im 118/236]
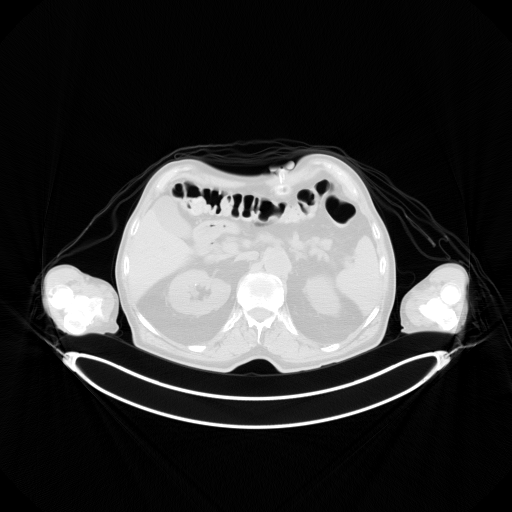
[im 177/236]
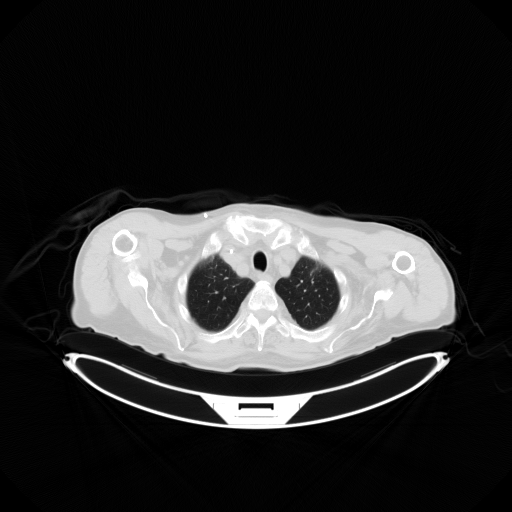
[im 236/236  brain]
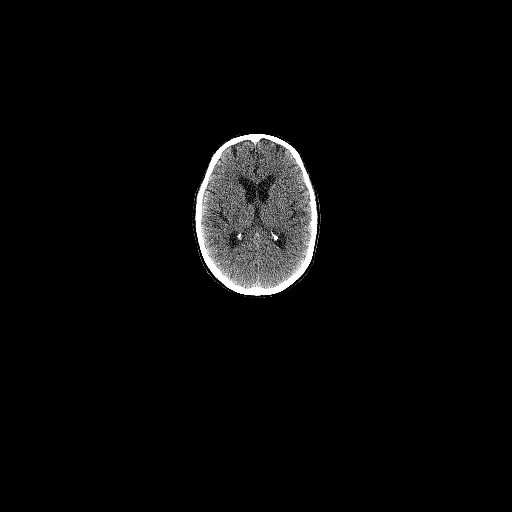

[Series 5: pet hn_sk_thigh nac · axial · 5.0mm · 4.07mm/px · z∈[-1496,-556]mm · 6 of 236 slices shown]
[im 1/236]
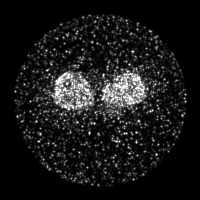
[im 48/236]
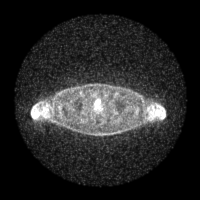
[im 95/236]
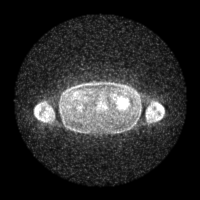
[im 142/236]
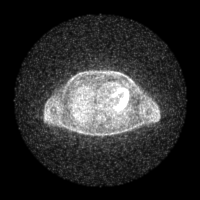
[im 189/236]
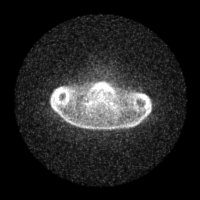
[im 236/236]
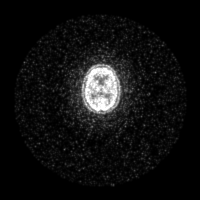

[Series 603: mip range · coronal · 1.95mm/px · 1 of 32 slices shown]
[im 1/32]
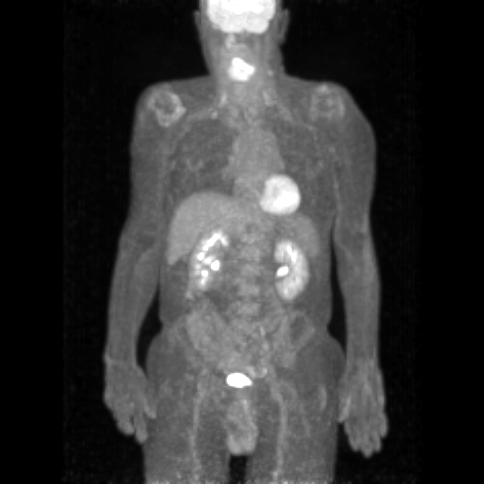

[Series 604: range-ct hn_sk_th 5.0 hd_fov-cor-<alpha range> · 2 of 76 slices shown]
[im 1/76]
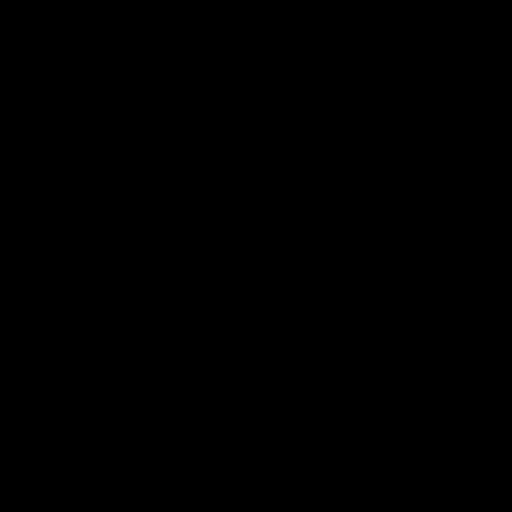
[im 76/76]
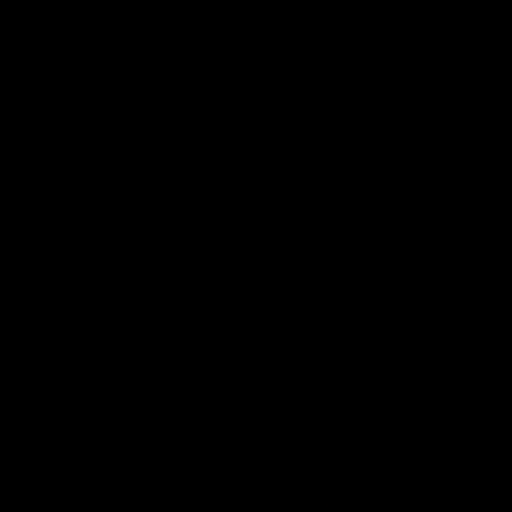

[Series 605: range-ct hn_sk_th 5.0 hd_fov-tra-<alpha range> · 5 of 229 slices shown]
[im 1/229]
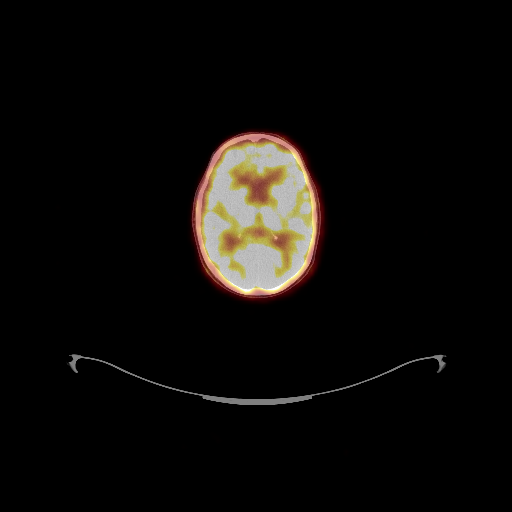
[im 58/229]
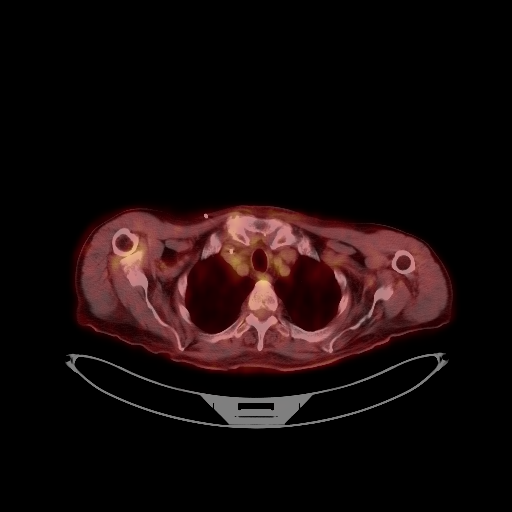
[im 115/229]
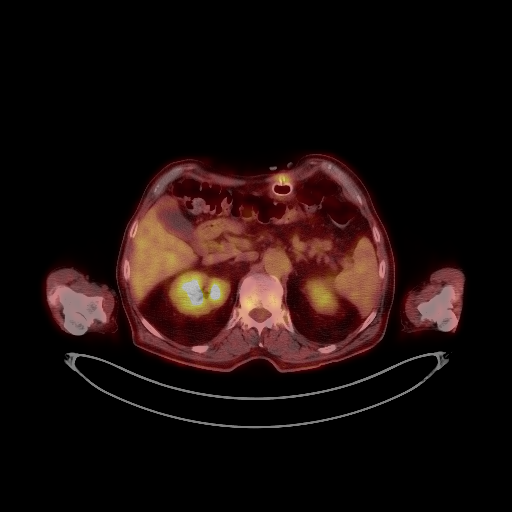
[im 172/229]
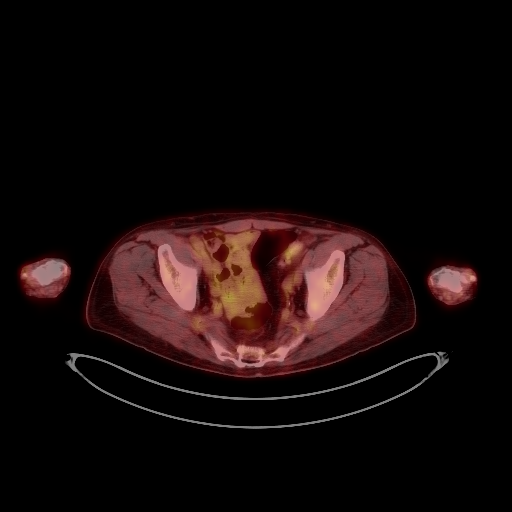
[im 229/229]
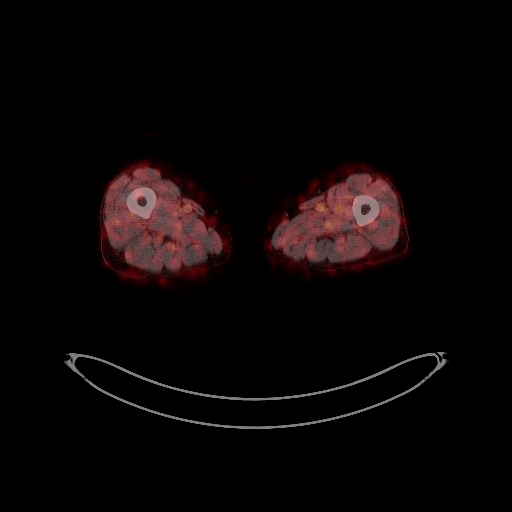

[Series 1122: results mm oncology reading · 5.0mm · 0.89mm/px · 1 of 4 slices shown]
[im 1/4]
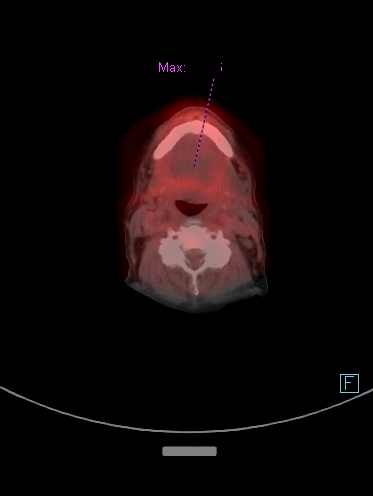

[25 of 25 positions shown; findings below may reference images not displayed]

FINDINGS: NECK: Hypermetabolism which involves the tongue and floor of mouth.
This now extends more anteriorly than on the prior. Measures a
S.U.V. max of 12.1, with relative photopenia centrally. On the
prior, this measured a S.U.V. max of 20.2.

The right-sided hypermetabolic nodes have resolved. There is a
left-sided level 2 node which measures 7 mm a S.U.V. max of
today versus 7 mm and a S.U.V. max of 3.2 on the prior exam.

No new cervical adenopathy.

Bilateral mastoid effusions.

CHEST: No pulmonary parenchymal or thoracic nodal hypermetabolism
mild cardiomegaly. Coronary artery atherosclerosis. Upper normal
ascending aortic size, 4.0 cm. Aortic atherosclerosis.

ABDOMEN/PELVIS: No abdominopelvic parenchymal or nodal
hypermetabolism. Punctate upper pole right renal collecting system
calculus. Normal adrenal glands. Gastrostomy tube. Apparent bladder
wall thickening is favored to be due to underdistention.

SKELETON: No abnormal marrow activity. No focal osseous lesion.
Fusion of the bilateral sacroiliac joints could be degenerative.
Bilateral glenohumeral joint osteoarthritis.
IMPRESSION: 1. Decrease in hypermetabolism involving the tongue and floor of
mouth. This does extend more anteriorly today. Although the portion
of this could be related to radiation change, residual disease is
suspected. Is
2. Response to therapy of cervical nodal metastasis.
3. No evidence of hypermetabolic extra cervical disease.
4. Coronary artery atherosclerosis. Aortic Atherosclerosis
([id number]).
